# Patient Record
Sex: Female | Born: 1960
Health system: Southern US, Community
[De-identification: ages and names within clinical notes are randomized; demographics above are authoritative.]

## PROBLEM LIST (undated history)

## (undated) DIAGNOSIS — E559 Vitamin D deficiency, unspecified: Secondary | ICD-10-CM

## (undated) DIAGNOSIS — E785 Hyperlipidemia, unspecified: Secondary | ICD-10-CM

## (undated) DIAGNOSIS — Z90710 Acquired absence of both cervix and uterus: Secondary | ICD-10-CM

## (undated) DIAGNOSIS — I82409 Acute embolism and thrombosis of unspecified deep veins of unspecified lower extremity: Secondary | ICD-10-CM

## (undated) DIAGNOSIS — R51 Headache: Secondary | ICD-10-CM

## (undated) DIAGNOSIS — I10 Essential (primary) hypertension: Secondary | ICD-10-CM

## (undated) HISTORY — PX: TUBAL LIGATION: SHX77

## (undated) HISTORY — DX: Hyperlipidemia, unspecified: E78.5

## (undated) HISTORY — PX: BREAST SURGERY: SHX581

---
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
## 2011-06-13 ENCOUNTER — Emergency Department (HOSPITAL_COMMUNITY)
Admission: EM | Admit: 2011-06-13 | Discharge: 2011-06-13 | Disposition: A | Discharge status: Home / Self Care | Payer: Self-pay | Attending: Emergency Medicine | Admitting: Emergency Medicine

## 2011-06-13 ENCOUNTER — Encounter (HOSPITAL_COMMUNITY): Payer: Self-pay

## 2011-06-13 ENCOUNTER — Emergency Department (HOSPITAL_COMMUNITY): Payer: Self-pay

## 2011-06-13 DIAGNOSIS — R112 Nausea with vomiting, unspecified: Secondary | ICD-10-CM

## 2011-06-13 DIAGNOSIS — IMO0001 Reserved for inherently not codable concepts without codable children: Secondary | ICD-10-CM | POA: Insufficient documentation

## 2011-06-13 DIAGNOSIS — J40 Bronchitis, not specified as acute or chronic: Secondary | ICD-10-CM

## 2011-06-13 LAB — BASIC METABOLIC PANEL
BUN: 10 mg/dL (ref 6–23)
CO2: 25 mEq/L (ref 19–32)
Calcium: 8.4 mg/dL (ref 8.4–10.5)
Chloride: 105 mEq/L (ref 96–112)
Creatinine, Ser: 0.63 mg/dL (ref 0.50–1.10)
GFR calc Af Amer: >90 mL/min (ref >90)
GFR calc non Af Amer: >90 mL/min (ref >90)
Glucose, Bld: 87 mg/dL (ref 70–99)
Potassium: 3.5 mEq/L (ref 3.5–5.1)
Sodium: 138 mEq/L (ref 135–145)

## 2011-06-13 LAB — CBC
HCT: 33.9 % — ABNORMAL LOW (ref 36.0–46.0)
Hemoglobin: 11.2 g/dL — ABNORMAL LOW (ref 12.0–15.0)
MCH: 28.7 pg (ref 26.0–34.0)
MCHC: 33 g/dL (ref 30.0–36.0)
MCV: 86.9 fL (ref 78.0–100.0)
Platelets: 174 10*3/uL (ref 150–400)
RBC: 3.9 MIL/uL (ref 3.87–5.11)
RDW: 16 % — ABNORMAL HIGH (ref 11.5–15.5)
WBC: 4.4 10*3/uL (ref 4.0–10.5)

## 2011-06-13 MED ORDER — ONDANSETRON HCL 4 MG/2ML IJ SOLN
4.0000 mg | Freq: Once | INTRAMUSCULAR | Status: AC
  Administered 2011-06-13: 4 mg via INTRAVENOUS
  Filled 2011-06-13: qty 2

## 2011-06-13 MED ORDER — PROMETHAZINE HCL 25 MG PO TABS: 25.0000 mg | ORAL_TABLET | Freq: Four times a day (QID) | ORAL | Status: DC | PRN

## 2011-06-13 MED ORDER — SODIUM CHLORIDE 0.9 % IV BOLUS (SEPSIS)
1000.0000 mL | Freq: Once | INTRAVENOUS | Status: AC
  Administered 2011-06-13: 1000 mL via INTRAVENOUS

## 2011-06-13 MED ORDER — MORPHINE SULFATE 4 MG/ML IJ SOLN
4.0000 mg | Freq: Once | INTRAMUSCULAR | Status: AC
  Administered 2011-06-13: 4 mg via INTRAVENOUS
  Filled 2011-06-13: qty 1

## 2011-06-13 NOTE — Discharge Instructions (Signed)

## 2011-06-13 NOTE — ED Provider Notes (Signed)
History     CSN: [id number]  Arrival date & time 06/13/11  4098   First MD Initiated Contact with Patient 06/13/11 [phone]      Chief Complaint  Patient presents with  . Cough  . Generalized Body Aches    (Consider location/radiation/quality/duration/timing/severity/associated sxs/prior treatment) The history is provided by the patient.   the patient reports she's had URI symptoms for 4 or 5 days with sinus pressure.  2 days ago she developed productive cough with green sputum without shortness of breath.  She reports subjective fever and chills.  She had several episodes of nausea and nonbloody nonbilious vomiting that occurred last night.  She's had no diarrhea.  She denies abdominal pain.  She reports no chest pain at this time.  She reports generalized myalgias.  Nothing worsens her symptoms.  Nothing improves her symptoms.  Her symptoms are constant and mild in severity  History reviewed. No pertinent past medical history.  Past Surgical History  Procedure Date  . Breast surgery     Family History  Problem Relation Age of Onset  . Heart failure Mother   . Hypertension Mother     History  Substance Use Topics  . Smoking status: Current Everyday Smoker -- 0.5 packs/day  . Smokeless tobacco: Never Used  . Alcohol Use: No    OB History    Grav Para Term Preterm Abortions TAB SAB Ect Mult Living                  Review of Systems  Respiratory: Positive for cough.   All other systems reviewed and are negative.    Allergies  Review of patient's allergies indicates no known allergies.  Home Medications  No current outpatient prescriptions on file.  BP 112/65  Pulse 84  Temp(Src) 97.6 F (36.4 C) (Oral)  Resp 18  SpO2 100%  LMP 06/13/2011  Physical Exam  Nursing note and vitals reviewed. Constitutional: She is oriented to person, place, and time. She appears well-developed and well-nourished. No distress.  HENT:  Head: Normocephalic and atraumatic.  Eyes:  EOM are normal.  Neck: Normal range of motion.  Cardiovascular: Normal rate, regular rhythm and normal heart sounds.   Pulmonary/Chest: Effort normal and breath sounds normal.  Abdominal: Soft. She exhibits no distension. There is no tenderness.  Musculoskeletal: Normal range of motion.  Neurological: She is alert and oriented to person, place, and time.  Skin: Skin is warm and dry.  Psychiatric: She has a normal mood and affect. Judgment normal.    ED Course  Procedures (including critical care time)  Labs Reviewed  CBC - Abnormal; Notable for the following:    Hemoglobin 11.2 (*)    HCT 33.9 (*)    RDW 16.0 (*)    All other components within normal limits  BASIC METABOLIC PANEL   Dg Chest 2 View  06/13/2011  *RADIOLOGY REPORT*  Clinical Data: Cough, generalized body aches  CHEST - 2 VIEW  Comparison: None.  Findings: Cardiomediastinal silhouette is unremarkable.  No acute infiltrate or pulmonary edema.  Bony thorax is unremarkable.  Mild perihilar increased bronchial markings suspicious for mild bronchitic changes.  IMPRESSION: No acute infiltrate or pulmonary edema.  Mild perihilar increased bronchial markings suspicious for mild bronchitic changes.  Original Report Authenticated By: Natasha Mead, M.D.     1. Bronchitis   2. Nausea & vomiting       MDM  Labs and x-ray pending.  Patient will receive IV fluids and Zofran.  Her abdomen is benign on exam.  This is not ACS or pulmonary embolism.  I suspect this is viral bronchitis.  Her episodes of vomiting some posttussive in nature.  I recommended that the patient stop smoking cigarettes  12:23 PM The patient feels much better at this time.  Her repeat abdominal exam is benign.  She agrees to outpatient plan.  DC home with antinausea medicine.  This appears to be a bronchitis with associated nausea and vomiting without diarrhea        Lyanne Co, MD 06/13/11 1223

## 2011-06-13 NOTE — ED Notes (Signed)
Patient reports that she began having ahead and chest congestion 5 days ago and is now having body aches, a productive cough with green sputum and a fever on Thursday.

## 2012-03-09 DIAGNOSIS — Z90710 Acquired absence of both cervix and uterus: Secondary | ICD-10-CM

## 2012-03-09 HISTORY — DX: Acquired absence of both cervix and uterus: Z90.710

## 2012-03-24 NOTE — H&P (Signed)
[name]  DICTATION #[id number] CSN# [id number]   [name], MD 03/24/2012 1:36 PM

## 2012-03-25 NOTE — H&P (Signed)
[name], [name]               ACCOUNT NO.:  [id number]  MEDICAL RECORD NO.:  [id number]  LOCATION:  [facility]                         FACILITY:  [facility]  PHYSICIAN:  Duke Salvia. Marcelle Overlie, M.D.DATE OF BIRTH:  Jun 09, 1960  DATE OF ADMISSION:  04/06/2012 DATE OF DISCHARGE:                             HISTORY & PHYSICAL   CHIEF COMPLAINT:  Pelvic pain, menorrhagia, leiomyoma.  HISTORY OF PRESENT ILLNESS:  A 52 year old G7, P5, prior tubal.  I saw initially in November 2013, with complaints of pelvic pain and dysmenorrhea.  Initially, she said she was told previously that she did have fibroids.  My initial exam did show an enlarged uterus.  Ultrasound FHT was done in our office, February 25, 2012.  This showed multiple fibroids, 5.1, 4.0, 4.2, 3.6, 4.7, 5.4, and a number of other smaller ones.  She was given Percocet for pain, but has had enough difficulty with her symptoms that she presents now for definitive TAH-BSO.  She did state that since she is perimenopausal, would prefer to have her ovaries removed at that time.  The procedure of TAH-BSO discussed at length with her including specific risks related to bleeding infection, transfusion, adjacent organ injury, wound infection, phlebitis, the possible need for ERT along with her expected recovery time.  PAST MEDICAL HISTORY:  Allergies:  None. Current Medications:  Percocet p.r.n.  OBSTETRICAL HISTORY:  She has had 2 pregnancy terminations, 5 vaginal deliveries, tubal ligation, and an outpatient breast biopsy for benign disease.  REVIEW OF SYSTEMS:  Significant for headache, otherwise negative.  FAMILY HISTORY:  Significant for heart disease, asthma, diabetes.  SOCIAL HISTORY:  Denies drug or alcohol use.  She does smoke 1/2 PPD. She is single.  PHYSICAL EXAMINATION:  VITAL SIGNS:  Temp 98.2, blood pressure is 110/70. HEENT:  Unremarkable. NECK:  Supple without masses. LUNGS:  Clear. CARDIOVASCULAR:  Regular rate and rhythm  without murmurs, rubs, or gallops. BREASTS:  Without masses. ABDOMEN:  Soft, flat, nontender. PELVIC:  Normal external genitalia.  Vagina and cervix clear.  Uterus is 12-14 week size.  Adnexa unremarkable.  There was no significant descent and fundus of the uterus was __________ at the top. EXTREMITIES:  Unremarkable. NEUROLOGIC:  Unremarkable.  IMPRESSION:  Symptomatic leiomyoma.  PLAN:  TAH-BSO.  Procedure and risks discussed as above.     Bergen Magner M. Marcelle Overlie, M.D.     RMH/MEDQ  D:  03/24/2012  T:  03/24/2012  Job:  [id number]

## 2012-04-01 ENCOUNTER — Encounter (HOSPITAL_COMMUNITY)
Admission: RE | Admit: 2012-04-01 | Discharge: 2012-04-01 | Disposition: A | Discharge status: Home / Self Care | Payer: PRIVATE HEALTH INSURANCE | Source: Ambulatory Visit | Attending: Obstetrics and Gynecology | Admitting: Obstetrics and Gynecology

## 2012-04-01 ENCOUNTER — Encounter (HOSPITAL_COMMUNITY): Payer: Self-pay

## 2012-04-01 ENCOUNTER — Encounter (HOSPITAL_COMMUNITY): Payer: Self-pay | Admitting: Pharmacy Technician

## 2012-04-01 HISTORY — DX: Headache: R51

## 2012-04-01 LAB — CBC
HCT: 36.9 % (ref 36.0–46.0)
Hemoglobin: 11.9 g/dL — ABNORMAL LOW (ref 12.0–15.0)
MCH: 29.5 pg (ref 26.0–34.0)
MCHC: 32.2 g/dL (ref 30.0–36.0)
MCV: 91.3 fL (ref 78.0–100.0)
Platelets: 258 K/uL (ref 150–400)
RBC: 4.04 MIL/uL (ref 3.87–5.11)
RDW: 15.2 % (ref 11.5–15.5)
WBC: 5.4 K/uL (ref 4.0–10.5)

## 2012-04-01 LAB — SURGICAL PCR SCREEN
MRSA, PCR: NEGATIVE
Staphylococcus aureus: NEGATIVE

## 2012-04-01 NOTE — Patient Instructions (Addendum)
   Your procedure is scheduled on: Wednesday, Jan 29  Enter through the [employer] [facility] at: 6 am Pick up the phone at the desk and dial [phone] and inform us of your arrival.  Please call this number if you have any problems the morning of surgery: [phone]  Remember: Do not eat food after midnight: Tuesday Do not drink clear liquids after: midnight Tuesday Take these medicines the morning of surgery with a SIP OF WATER:  None  Do not wear jewelry, make-up, or FINGER nail polish No metal in your hair or on your body. Do not wear lotions, powders, perfumes. You may wear deodorant.  Please use your CHG wash as directed prior to surgery.  Do not shave anywhere for at least 12 hours prior to first CHG shower.  Do not bring valuables to the hospital. Contacts, dentures or bridgework may not be worn into surgery.  Leave suitcase in the car. After Surgery it may be brought to your room. For patients being admitted to the hospital, checkout time is 11:00am the day of discharge.   Home with daughter Lieutenant Diego.

## 2012-04-06 ENCOUNTER — Inpatient Hospital Stay (HOSPITAL_COMMUNITY)
Admission: RE | Admit: 2012-04-06 | Discharge: 2012-04-08 | DRG: 743 | Disposition: A | Discharge status: Home / Self Care | Payer: PRIVATE HEALTH INSURANCE | Source: Ambulatory Visit | Attending: Obstetrics and Gynecology | Admitting: Obstetrics and Gynecology

## 2012-04-06 ENCOUNTER — Inpatient Hospital Stay (HOSPITAL_COMMUNITY): Payer: PRIVATE HEALTH INSURANCE | Admitting: Anesthesiology

## 2012-04-06 ENCOUNTER — Encounter (HOSPITAL_COMMUNITY)
Admission: RE | Disposition: A | Discharge status: Home / Self Care | Payer: Self-pay | Source: Ambulatory Visit | Attending: Obstetrics and Gynecology

## 2012-04-06 ENCOUNTER — Encounter (HOSPITAL_COMMUNITY): Payer: Self-pay | Admitting: Anesthesiology

## 2012-04-06 ENCOUNTER — Encounter (HOSPITAL_COMMUNITY): Payer: Self-pay | Admitting: *Deleted

## 2012-04-06 DIAGNOSIS — N949 Unspecified condition associated with female genital organs and menstrual cycle: Secondary | ICD-10-CM | POA: Diagnosis present

## 2012-04-06 DIAGNOSIS — D251 Intramural leiomyoma of uterus: Secondary | ICD-10-CM | POA: Diagnosis present

## 2012-04-06 DIAGNOSIS — D252 Subserosal leiomyoma of uterus: Secondary | ICD-10-CM | POA: Diagnosis present

## 2012-04-06 DIAGNOSIS — N9489 Other specified conditions associated with female genital organs and menstrual cycle: Secondary | ICD-10-CM | POA: Diagnosis present

## 2012-04-06 DIAGNOSIS — N8000 Endometriosis of the uterus, unspecified: Secondary | ICD-10-CM | POA: Diagnosis present

## 2012-04-06 DIAGNOSIS — N80209 Endometriosis of unspecified fallopian tube, unspecified depth: Secondary | ICD-10-CM | POA: Diagnosis present

## 2012-04-06 DIAGNOSIS — N946 Dysmenorrhea, unspecified: Secondary | ICD-10-CM | POA: Diagnosis present

## 2012-04-06 DIAGNOSIS — N802 Endometriosis of fallopian tube: Secondary | ICD-10-CM | POA: Diagnosis present

## 2012-04-06 DIAGNOSIS — D219 Benign neoplasm of connective and other soft tissue, unspecified: Secondary | ICD-10-CM

## 2012-04-06 DIAGNOSIS — N938 Other specified abnormal uterine and vaginal bleeding: Principal | ICD-10-CM | POA: Diagnosis present

## 2012-04-06 HISTORY — PX: SALPINGOOPHORECTOMY: SHX82

## 2012-04-06 HISTORY — PX: ABDOMINAL HYSTERECTOMY: SHX81

## 2012-04-06 LAB — PREGNANCY, URINE: Preg Test, Ur: NEGATIVE

## 2012-04-06 SURGERY — HYSTERECTOMY, ABDOMINAL
Anesthesia: General | Site: Abdomen | Wound class: Clean Contaminated

## 2012-04-06 MED ORDER — KETOROLAC TROMETHAMINE 30 MG/ML IJ SOLN
INTRAMUSCULAR | Status: DC | PRN
  Administered 2012-04-06: 30 mg via INTRAVENOUS

## 2012-04-06 MED ORDER — DEXAMETHASONE SODIUM PHOSPHATE 4 MG/ML IJ SOLN
INTRAMUSCULAR | Status: DC | PRN
  Administered 2012-04-06: 10 mg via INTRAVENOUS

## 2012-04-06 MED ORDER — BUPIVACAINE LIPOSOME 1.3 % IJ SUSP
20.0000 mL | Freq: Once | INTRAMUSCULAR | Status: AC
  Administered 2012-04-06: 20 mL
  Filled 2012-04-06: qty 20

## 2012-04-06 MED ORDER — LIDOCAINE HCL (CARDIAC) 20 MG/ML IV SOLN
INTRAVENOUS | Status: AC
  Filled 2012-04-06: qty 5

## 2012-04-06 MED ORDER — ROCURONIUM BROMIDE 100 MG/10ML IV SOLN
INTRAVENOUS | Status: DC | PRN
  Administered 2012-04-06: 50 mg via INTRAVENOUS

## 2012-04-06 MED ORDER — IBUPROFEN 800 MG PO TABS
800.0000 mg | ORAL_TABLET | Freq: Three times a day (TID) | ORAL | Status: DC | PRN
  Administered 2012-04-07 – 2012-04-08 (×3): 800 mg via ORAL
  Filled 2012-04-06 (×3): qty 1

## 2012-04-06 MED ORDER — KETOROLAC TROMETHAMINE 30 MG/ML IJ SOLN: 30.0000 mg | Freq: Once | INTRAMUSCULAR | Status: DC

## 2012-04-06 MED ORDER — HYDROMORPHONE HCL PF 1 MG/ML IJ SOLN
INTRAMUSCULAR | Status: AC
  Administered 2012-04-06: 0.5 mg via INTRAVENOUS
  Filled 2012-04-06: qty 1

## 2012-04-06 MED ORDER — MORPHINE SULFATE (PF) 1 MG/ML IV SOLN
INTRAVENOUS | Status: DC
  Administered 2012-04-06: 5 mg via INTRAVENOUS
  Administered 2012-04-06: 11:00:00 via INTRAVENOUS
  Administered 2012-04-06: 15 mg via INTRAVENOUS
  Administered 2012-04-06: 22:00:00 via INTRAVENOUS
  Administered 2012-04-06: 12 mL via INTRAVENOUS
  Administered 2012-04-07: 15 mg via INTRAVENOUS
  Administered 2012-04-07: 13.53 mg via INTRAVENOUS
  Filled 2012-04-06 (×2): qty 25

## 2012-04-06 MED ORDER — ZOLPIDEM TARTRATE 5 MG PO TABS
5.0000 mg | ORAL_TABLET | Freq: Every evening | ORAL | Status: DC | PRN
Start: 2012-04-06 — End: 2012-04-08

## 2012-04-06 MED ORDER — ONDANSETRON HCL 4 MG/2ML IJ SOLN
INTRAMUSCULAR | Status: DC | PRN
  Administered 2012-04-06 (×2): 4 mg via INTRAVENOUS

## 2012-04-06 MED ORDER — LACTATED RINGERS IV SOLN
INTRAVENOUS | Status: DC
  Administered 2012-04-06 (×2): via INTRAVENOUS

## 2012-04-06 MED ORDER — LACTATED RINGERS IV SOLN
INTRAVENOUS | Status: DC
  Administered 2012-04-06 – 2012-04-07 (×4): via INTRAVENOUS

## 2012-04-06 MED ORDER — SODIUM CHLORIDE 0.9 % IJ SOLN: 20.0000 mL | Freq: Once | INTRAMUSCULAR | Status: DC

## 2012-04-06 MED ORDER — OXYCODONE-ACETAMINOPHEN 5-325 MG PO TABS
1.0000 | ORAL_TABLET | ORAL | Status: DC | PRN
Start: 2012-04-06 — End: 2012-04-08
  Administered 2012-04-07 (×2): 1 via ORAL
  Administered 2012-04-08 (×2): 2 via ORAL
  Filled 2012-04-06: qty 1
  Filled 2012-04-06 (×2): qty 2
  Filled 2012-04-06: qty 1

## 2012-04-06 MED ORDER — LIDOCAINE HCL (CARDIAC) 20 MG/ML IV SOLN
INTRAVENOUS | Status: DC | PRN
  Administered 2012-04-06: 80 mg via INTRAVENOUS

## 2012-04-06 MED ORDER — NALOXONE HCL 0.4 MG/ML IJ SOLN: 0.4000 mg | INTRAMUSCULAR | Status: DC | PRN

## 2012-04-06 MED ORDER — ONDANSETRON HCL 4 MG/2ML IJ SOLN
INTRAMUSCULAR | Status: AC
  Filled 2012-04-06: qty 2

## 2012-04-06 MED ORDER — DEXAMETHASONE SODIUM PHOSPHATE 10 MG/ML IJ SOLN
INTRAMUSCULAR | Status: AC
  Filled 2012-04-06: qty 1

## 2012-04-06 MED ORDER — CEFAZOLIN SODIUM-DEXTROSE 2-3 GM-% IV SOLR
2.0000 g | INTRAVENOUS | Status: AC
  Administered 2012-04-06: 2 g via INTRAVENOUS

## 2012-04-06 MED ORDER — FENTANYL CITRATE 0.05 MG/ML IJ SOLN
INTRAMUSCULAR | Status: AC
  Filled 2012-04-06: qty 2

## 2012-04-06 MED ORDER — MENTHOL 3 MG MT LOZG: 1.0000 | LOZENGE | OROMUCOSAL | Status: DC | PRN

## 2012-04-06 MED ORDER — DIPHENHYDRAMINE HCL 12.5 MG/5ML PO ELIX: 12.5000 mg | ORAL_SOLUTION | Freq: Four times a day (QID) | ORAL | Status: DC | PRN

## 2012-04-06 MED ORDER — NEOSTIGMINE METHYLSULFATE 1 MG/ML IJ SOLN
INTRAMUSCULAR | Status: DC | PRN
  Administered 2012-04-06: 3 mg via INTRAVENOUS

## 2012-04-06 MED ORDER — HYDROMORPHONE HCL PF 1 MG/ML IJ SOLN
0.2500 mg | INTRAMUSCULAR | Status: DC | PRN
  Administered 2012-04-06 (×3): 0.5 mg via INTRAVENOUS

## 2012-04-06 MED ORDER — MIDAZOLAM HCL 2 MG/2ML IJ SOLN
INTRAMUSCULAR | Status: AC
  Filled 2012-04-06: qty 2

## 2012-04-06 MED ORDER — PROPOFOL 10 MG/ML IV EMUL
INTRAVENOUS | Status: DC | PRN
  Administered 2012-04-06: 120 mg via INTRAVENOUS

## 2012-04-06 MED ORDER — ROCURONIUM BROMIDE 50 MG/5ML IV SOLN
INTRAVENOUS | Status: AC
  Filled 2012-04-06: qty 1

## 2012-04-06 MED ORDER — FENTANYL CITRATE 0.05 MG/ML IJ SOLN
INTRAMUSCULAR | Status: DC | PRN
  Administered 2012-04-06 (×2): 50 ug via INTRAVENOUS
  Administered 2012-04-06: 150 ug via INTRAVENOUS
  Administered 2012-04-06 (×2): 50 ug via INTRAVENOUS

## 2012-04-06 MED ORDER — PROPOFOL 10 MG/ML IV EMUL
INTRAVENOUS | Status: AC
  Filled 2012-04-06: qty 20

## 2012-04-06 MED ORDER — KETOROLAC TROMETHAMINE 30 MG/ML IJ SOLN
30.0000 mg | Freq: Four times a day (QID) | INTRAMUSCULAR | Status: DC
  Administered 2012-04-06 – 2012-04-07 (×3): 30 mg via INTRAVENOUS
  Filled 2012-04-06 (×3): qty 1

## 2012-04-06 MED ORDER — SODIUM CHLORIDE 0.9 % IJ SOLN: 9.0000 mL | INTRAMUSCULAR | Status: DC | PRN

## 2012-04-06 MED ORDER — KETOROLAC TROMETHAMINE 30 MG/ML IJ SOLN: 30.0000 mg | Freq: Four times a day (QID) | INTRAMUSCULAR | Status: DC

## 2012-04-06 MED ORDER — HYDROMORPHONE HCL PF 1 MG/ML IJ SOLN
INTRAMUSCULAR | Status: AC
  Filled 2012-04-06: qty 1

## 2012-04-06 MED ORDER — DIPHENHYDRAMINE HCL 50 MG/ML IJ SOLN: 12.5000 mg | Freq: Four times a day (QID) | INTRAMUSCULAR | Status: DC | PRN

## 2012-04-06 MED ORDER — FENTANYL CITRATE 0.05 MG/ML IJ SOLN
INTRAMUSCULAR | Status: AC
  Filled 2012-04-06: qty 5

## 2012-04-06 MED ORDER — MIDAZOLAM HCL 5 MG/5ML IJ SOLN
INTRAMUSCULAR | Status: DC | PRN
  Administered 2012-04-06: 2 mg via INTRAVENOUS

## 2012-04-06 MED ORDER — NEOSTIGMINE METHYLSULFATE 1 MG/ML IJ SOLN
INTRAMUSCULAR | Status: AC
  Filled 2012-04-06: qty 1

## 2012-04-06 MED ORDER — 0.9 % SODIUM CHLORIDE (POUR BTL) OPTIME
TOPICAL | Status: DC | PRN
  Administered 2012-04-06: 1000 mL

## 2012-04-06 MED ORDER — BUPIVACAINE HCL (PF) 0.5 % IJ SOLN
INTRAMUSCULAR | Status: AC
  Filled 2012-04-06: qty 270

## 2012-04-06 MED ORDER — ACETAMINOPHEN 10 MG/ML IV SOLN
INTRAVENOUS | Status: AC
  Filled 2012-04-06: qty 100

## 2012-04-06 MED ORDER — ONDANSETRON HCL 4 MG PO TABS
4.0000 mg | ORAL_TABLET | Freq: Four times a day (QID) | ORAL | Status: DC | PRN
  Filled 2012-04-06: qty 1

## 2012-04-06 MED ORDER — KETOROLAC TROMETHAMINE 30 MG/ML IJ SOLN: 15.0000 mg | Freq: Once | INTRAMUSCULAR | Status: DC | PRN

## 2012-04-06 MED ORDER — KETOROLAC TROMETHAMINE 30 MG/ML IJ SOLN
INTRAMUSCULAR | Status: AC
  Filled 2012-04-06: qty 1

## 2012-04-06 MED ORDER — METHYLENE BLUE 1 % INJ SOLN
INTRAMUSCULAR | Status: AC
  Filled 2012-04-06: qty 1

## 2012-04-06 MED ORDER — BUTORPHANOL TARTRATE 1 MG/ML IJ SOLN: 1.0000 mg | INTRAMUSCULAR | Status: DC | PRN

## 2012-04-06 MED ORDER — CEFAZOLIN SODIUM-DEXTROSE 2-3 GM-% IV SOLR
INTRAVENOUS | Status: AC
  Filled 2012-04-06: qty 50

## 2012-04-06 MED ORDER — GLYCOPYRROLATE 0.2 MG/ML IJ SOLN
INTRAMUSCULAR | Status: DC | PRN
  Administered 2012-04-06: 0.4 mg via INTRAVENOUS

## 2012-04-06 MED ORDER — GLYCOPYRROLATE 0.2 MG/ML IJ SOLN
INTRAMUSCULAR | Status: AC
  Filled 2012-04-06: qty 2

## 2012-04-06 MED ORDER — ACETAMINOPHEN 10 MG/ML IV SOLN
1000.0000 mg | Freq: Once | INTRAVENOUS | Status: AC
  Administered 2012-04-06: 1000 mg via INTRAVENOUS
  Filled 2012-04-06: qty 100

## 2012-04-06 SURGICAL SUPPLY — 37 items
BARRIER ADHS 3X4 INTERCEED (GAUZE/BANDAGES/DRESSINGS) IMPLANT
CANISTER SUCTION 2500CC (MISCELLANEOUS) ×3 IMPLANT
CATH KIT ON Q 5IN DUAL SLV (PAIN MANAGEMENT) IMPLANT
CLOTH BEACON ORANGE TIMEOUT ST (SAFETY) ×3 IMPLANT
CONT PATH 16OZ SNAP LID 3702 (MISCELLANEOUS) ×3 IMPLANT
DECANTER SPIKE VIAL GLASS SM (MISCELLANEOUS) IMPLANT
DRSG COVADERM 4X10 (GAUZE/BANDAGES/DRESSINGS) ×3 IMPLANT
DRSG TEGADERM 2.38X2.75 (GAUZE/BANDAGES/DRESSINGS) IMPLANT
ELECT LIGASURE SHORT 9 REUSE (ELECTRODE) ×3 IMPLANT
GLOVE BIO SURGEON STRL SZ7 (GLOVE) ×6 IMPLANT
GOWN PREVENTION PLUS LG XLONG (DISPOSABLE) ×12 IMPLANT
NEEDLE HYPO 25X1 1.5 SAFETY (NEEDLE) ×3 IMPLANT
NS IRRIG 1000ML POUR BTL (IV SOLUTION) ×6 IMPLANT
PACK ABDOMINAL GYN (CUSTOM PROCEDURE TRAY) ×3 IMPLANT
PAD OB MATERNITY 4.3X12.25 (PERSONAL CARE ITEMS) ×3 IMPLANT
PROTECTOR NERVE ULNAR (MISCELLANEOUS) ×3 IMPLANT
SPONGE LAP 18X18 X RAY DECT (DISPOSABLE) ×3 IMPLANT
STRIP CLOSURE SKIN 1/4X4 (GAUZE/BANDAGES/DRESSINGS) IMPLANT
SUT CHROMIC 3 0 SH 27 (SUTURE) IMPLANT
SUT MON AB 2-0 CT1 36 (SUTURE) ×3 IMPLANT
SUT MON AB 4-0 PS1 27 (SUTURE) ×3 IMPLANT
SUT PDS AB 0 CT1 27 (SUTURE) ×6 IMPLANT
SUT PDS AB 1 CT  36 (SUTURE)
SUT PDS AB 1 CT 36 (SUTURE) IMPLANT
SUT VIC AB 0 CT1 18XCR BRD8 (SUTURE) ×6 IMPLANT
SUT VIC AB 0 CT1 27 (SUTURE) ×3
SUT VIC AB 0 CT1 27XBRD ANBCTR (SUTURE) ×6 IMPLANT
SUT VIC AB 0 CT1 8-18 (SUTURE) ×3
SUT VIC AB 2-0 CT1 27 (SUTURE)
SUT VIC AB 2-0 CT1 TAPERPNT 27 (SUTURE) IMPLANT
SUT VIC AB 3-0 CT1 27 (SUTURE) ×2
SUT VIC AB 3-0 CT1 TAPERPNT 27 (SUTURE) ×4 IMPLANT
SUT VICRYL 0 TIES 12 18 (SUTURE) ×3 IMPLANT
SYR CONTROL 10ML LL (SYRINGE) ×3 IMPLANT
TOWEL OR 17X24 6PK STRL BLUE (TOWEL DISPOSABLE) ×6 IMPLANT
TRAY FOLEY CATH 14FR (SET/KITS/TRAYS/PACK) ×3 IMPLANT
WATER STERILE IRR 1000ML POUR (IV SOLUTION) IMPLANT

## 2012-04-06 NOTE — Anesthesia Procedure Notes (Signed)
Procedure Name: Intubation Date/Time: 04/06/2012 7:27 AM Performed by: Quentin Ore Pre-anesthesia Checklist: Patient identified, Emergency Drugs available, Suction available, Patient being monitored and Timeout performed Patient Re-evaluated:Patient Re-evaluated prior to inductionOxygen Delivery Method: Circle system utilized Preoxygenation: Pre-oxygenation with 100% oxygen Intubation Type: IV induction Ventilation: Mask ventilation without difficulty Laryngoscope Size: Mac and 3 Grade View: Grade I Tube type: Oral Tube size: 7.0 mm Number of attempts: 1 Airway Equipment and Method: Stylet Placement Confirmation: ETT inserted through vocal cords under direct vision,  positive ETCO2 and breath sounds checked- equal and bilateral Secured at: 21 cm Tube secured with: Tape Dental Injury: Teeth and Oropharynx as per pre-operative assessment

## 2012-04-06 NOTE — Transfer of Care (Signed)
Immediate Anesthesia Transfer of Care Note  Patient: [name]  Procedure(s) Performed: Procedure(s) (LRB) with comments: HYSTERECTOMY ABDOMINAL (N/A) SALPINGO OOPHORECTOMY (Bilateral)  Patient Location: PACU  Anesthesia Type:General  Level of Consciousness: awake, alert  and oriented  Airway & Oxygen Therapy: Patient Spontanous Breathing and Patient connected to nasal cannula oxygen  Post-op Assessment: Report given to PACU RN, Post -op Vital signs reviewed and stable and Patient moving all extremities X 4  Post vital signs: Reviewed and stable  Complications: No apparent anesthesia complications

## 2012-04-06 NOTE — Anesthesia Postprocedure Evaluation (Signed)
  Anesthesia Post-op Note  Patient: [name]  Procedure(s) Performed: Procedure(s) (LRB) with comments: HYSTERECTOMY ABDOMINAL (N/A) SALPINGO OOPHORECTOMY (Bilateral)  Patient Location: PACU  Anesthesia Type:General  Level of Consciousness: awake, alert  and oriented  Airway and Oxygen Therapy: Patient Spontanous Breathing  Post-op Pain: none  Post-op Assessment: Post-op Vital signs reviewed, Patient's Cardiovascular Status Stable, Respiratory Function Stable, Patent Airway, No signs of Nausea or vomiting and Pain level controlled  Post-op Vital Signs: Reviewed and stable  Complications: No apparent anesthesia complications

## 2012-04-06 NOTE — Progress Notes (Signed)
Ur chart review completed.  

## 2012-04-06 NOTE — Preoperative (Signed)
Beta Blockers   Reason not to administer Beta Blockers:Not Applicable 

## 2012-04-06 NOTE — Progress Notes (Signed)
The patient was re-examined with no change in status 

## 2012-04-06 NOTE — Anesthesia Preprocedure Evaluation (Signed)
Anesthesia Evaluation  Patient identified by MRN, date of birth, ID band Patient awake    Reviewed: Allergy & Precautions, H&P , NPO status , Patient's Chart, lab work & pertinent test results, reviewed documented beta blocker date and time   History of Anesthesia Complications Negative for: history of anesthetic complications  Airway Mallampati: III TM Distance: >3 FB Neck ROM: full    Dental  (+) Poor Dentition and Loose,    Pulmonary Current Smoker,  breath sounds clear to auscultation  Pulmonary exam normal       Cardiovascular Exercise Tolerance: Good negative cardio ROS  Rhythm:regular Rate:Normal     Neuro/Psych negative neurological ROS  negative psych ROS   GI/Hepatic negative GI ROS, Neg liver ROS,   Endo/Other  negative endocrine ROS  Renal/GU negative Renal ROS  Female GU complaint     Musculoskeletal   Abdominal   Peds  Hematology negative hematology ROS (+)   Anesthesia Other Findings   Reproductive/Obstetrics negative OB ROS                           Anesthesia Physical Anesthesia Plan  ASA: II  Anesthesia Plan: General ETT   Post-op Pain Management:    Induction:   Airway Management Planned:   Additional Equipment:   Intra-op Plan:   Post-operative Plan:   Informed Consent: I have reviewed the patients History and Physical, chart, labs and discussed the procedure including the risks, benefits and alternatives for the proposed anesthesia with the patient or authorized representative who has indicated his/her understanding and acceptance.   Dental Advisory Given  Plan Discussed with: CRNA and Surgeon  Anesthesia Plan Comments:         Anesthesia Quick Evaluation

## 2012-04-06 NOTE — Op Note (Signed)
Preoperative diagnosis: Pelvic pain, leiomyoma  Postoperative diagnosis: Same  Procedure: TAH BSO  Surgeon: Marcelle Overlie  Assistant: Morris  EBL: 300 cc  Anesthesia: Gen.  Drains: Foley catheter  Procedure and findings:  Patient taken the operating room after an adequate level of general anesthesia was obtained the patient in supine position the abdomen prepped and draped in usual fashion for abdominal procedures, the vagina was prepped separately with Betadine and Foley catheter positioned draining clear urine. Appropriate timeout for taken at that point.  Transverse Pfannenstiel incision was then made 2 finger restaurant symphysis carried down to the fascia which was incised and extended transversely. Rectus muscle divided in the midline peritoneum entered superiorly without incident and extended in a vertical fashion. O'Connor-O'Sullivan retractor was positioned bowel Speck superiorly out of the field patient in Trendelenburg. The uterus itself was 14 weeks size irregular with fibroids bilateral adnexa unremarkable. After these findings were noted long Kelly clamps were then placed at each utero-ovarian pedicle starting on the left the round ligament was clamped divided and suture ligated with 0 Vicryl the rectoperineal space on that side was developed and the peritoneum carried around to midline posterior leaf of the broad ligament was then perforated with the surgeon's finger, the left IP ligament was isolated course of the ureter was noted to be well below, the left IP ligament was then clamped divided first free tie followed by suture ligature of Vicryl. The exact same repeated on the opposite side after carefully identifying the ureter well below once this was completed the bladder was dissected below the cervical vaginal junction with sharp and blunt dissection. Uterine vessels were skeletonized clamped divided and suture ligated with 0 Vicryl. The fundus of the uterus to remove the bulk was  then excised, tenaculum used to grasp the cervical stump. I'll noting that the bladder was well below the remaining cardinal ligament uterosacral ligament and cervical vaginal pedicles were clamped divided and suture ligated with 0 Vicryl remainder the specimen was removed the vaginal cuff was closed the interrupted figure-of-eight 2-0 Vicryl sutures. The pelvis is irrigated with saline and be hemostatic the upper abdomen cecum and appendix were unremarkable. Prior to closure sponge denies precast reported as correct x2 peritoneum was enclose a running 2-0 Vicryl suture. The same to reapproximate the rectus muscles in the midline. Fascia closure laterally to midline on either side with 0 PDS suture. The subcutaneous tissue was hemostatic 15 cc of 1.3% extrarenal was then used to inject the area the fascia closure the subcutaneous tissue along its margins. Subcutaneous tissue again was noted be hemostatic 4-0 Monocryl subcuticular closure for the skin she tolerated this well went to recovery room in good condition.  Dictated with [facility]  Marvion Bastidas M. Milana Obey.D.

## 2012-04-07 ENCOUNTER — Encounter (HOSPITAL_COMMUNITY): Payer: Self-pay | Admitting: Obstetrics and Gynecology

## 2012-04-07 LAB — CBC
HCT: 33.8 % — ABNORMAL LOW (ref 36.0–46.0)
Hemoglobin: 10.6 g/dL — ABNORMAL LOW (ref 12.0–15.0)
MCH: 29.4 pg (ref 26.0–34.0)
MCHC: 31.4 g/dL (ref 30.0–36.0)
MCV: 93.6 fL (ref 78.0–100.0)
Platelets: 283 10*3/uL (ref 150–400)
RBC: 3.61 MIL/uL — ABNORMAL LOW (ref 3.87–5.11)
RDW: 15.2 % (ref 11.5–15.5)
WBC: 11.1 10*3/uL — ABNORMAL HIGH (ref 4.0–10.5)

## 2012-04-07 MED ORDER — SODIUM CHLORIDE 0.9 % IJ SOLN
3.0000 mL | INTRAMUSCULAR | Status: DC | PRN
  Administered 2012-04-07: 3 mL via INTRAVENOUS

## 2012-04-07 MED ORDER — SODIUM CHLORIDE 0.9 % IJ SOLN
3.0000 mL | Freq: Two times a day (BID) | INTRAMUSCULAR | Status: DC
  Administered 2012-04-07: 3 mL via INTRAVENOUS

## 2012-04-07 MED ORDER — ONDANSETRON HCL 4 MG/2ML IJ SOLN
4.0000 mg | Freq: Once | INTRAMUSCULAR | Status: AC
  Administered 2012-04-07: 4 mg via INTRAVENOUS

## 2012-04-07 NOTE — Anesthesia Postprocedure Evaluation (Signed)
  Anesthesia Post-op Note  Patient: [name]  Procedure(s) Performed: Procedure(s) (LRB) with comments: HYSTERECTOMY ABDOMINAL (N/A) SALPINGO OOPHORECTOMY (Bilateral)  Patient Location: PACU and Women's Unit  Anesthesia Type:General  Level of Consciousness: awake, alert , oriented and patient cooperative  Airway and Oxygen Therapy: Patient Spontanous Breathing  Post-op Pain: mild  Post-op Assessment: Post-op Vital signs reviewed and Patient's Cardiovascular Status Stable  Post-op Vital Signs: Reviewed and stable  Complications: No apparent anesthesia complications

## 2012-04-07 NOTE — Addendum Note (Signed)
Addendum  created 04/07/12 0820 by Orlie Pollen, CRNA   Modules edited:Notes Section

## 2012-04-07 NOTE — Progress Notes (Signed)
1 Day Post-Op Procedure(s) (LRB): HYSTERECTOMY ABDOMINAL (N/A) SALPINGO OOPHORECTOMY (Bilateral)  Subjective: Patient reports tolerating PO.    Objective: I have reviewed patient's vital signs, intake and output and labs.  abd soft + BS, Inc C/D CBC    Component Value Date/Time   WBC 11.1* 04/07/2012 0510   RBC 3.61* 04/07/2012 0510   HGB 10.6* 04/07/2012 0510   HCT 33.8* 04/07/2012 0510   PLT 283 04/07/2012 0510   MCV 93.6 04/07/2012 0510   MCH 29.4 04/07/2012 0510   MCHC 31.4 04/07/2012 0510   RDW 15.2 04/07/2012 0510     Assessment: s/p Procedure(s) (LRB) with comments: HYSTERECTOMY ABDOMINAL (N/A) SALPINGO OOPHORECTOMY (Bilateral): stable  Plan: Encourage ambulation Discontinue IV fluids  LOS: 1 day    Aj Crunkleton M 04/07/2012, 8:42 AM

## 2012-04-08 DIAGNOSIS — D219 Benign neoplasm of connective and other soft tissue, unspecified: Secondary | ICD-10-CM

## 2012-04-08 MED ORDER — IBUPROFEN 800 MG PO TABS: 800.0000 mg | ORAL_TABLET | Freq: Three times a day (TID) | ORAL | Status: DC | PRN

## 2012-04-08 MED ORDER — OXYCODONE-ACETAMINOPHEN 5-325 MG PO TABS: 1.0000 | ORAL_TABLET | ORAL | Status: DC | PRN

## 2012-04-08 NOTE — Progress Notes (Signed)
Pt discharged to home with significant other and daughter.  Condition stable.  Pt to car via wheelchair with Dolphus Jenny, NT.  No equipment for home ordered at discharge.

## 2012-04-08 NOTE — Discharge Summary (Signed)
Physician Discharge Summary  Patient ID: Emmelia Holdsworth MRN: [id number] DOB/AGE: December 10, 1960 52 y.o.  Admit date: 04/06/2012 Discharge date: 04/08/2012  Admission Diagnoses:AUB, leiomyomata  Discharge Diagnoses: Same Active Problems:  Leiomyoma   Discharged Condition: good  Hospital Course: adm for TAHBSO, diet adv on POD #1, by POD @, afeb, tol PO, Inc C/D and ready for D/C  Consults: None  Significant Diagnostic Studies:  Results for orders placed during the hospital encounter of 04/06/12 (from the past 48 hour(s))  CBC     Status: Abnormal   Collection Time   04/07/12  5:10 AM      Component Value Range Comment   WBC 11.1 (*) 4.0 - 10.5 K/uL    RBC 3.61 (*) 3.87 - 5.11 MIL/uL    Hemoglobin 10.6 (*) 12.0 - 15.0 g/dL    HCT 84.6 (*) 96.2 - 46.0 %    MCV 93.6  78.0 - 100.0 fL    MCH 29.4  26.0 - 34.0 pg    MCHC 31.4  30.0 - 36.0 g/dL    RDW 95.2  84.1 - 32.4 %    Platelets 283  150 - 400 K/uL     Treatments: surgery: TAHBSO  Discharge Exam: Blood pressure 142/85, pulse 64, temperature 98 F (36.7 C), temperature source Oral, resp. rate 18, height 5\' 5"  (1.651 m), weight 84.369 kg (186 lb), SpO2 96.00%. abd soft + BS, Inc C/D  Disposition: 01-Home or Self Care     Medication List     As of 04/08/2012  8:36 AM    TAKE these medications         ibuprofen 800 MG tablet   Commonly known as: ADVIL,MOTRIN   Take 1 tablet (800 mg total) by mouth every 8 (eight) hours as needed (mild pain).      oxyCODONE-acetaminophen 5-325 MG per tablet   Commonly known as: PERCOCET/ROXICET   Take 1-2 tablets by mouth every 4 (four) hours as needed (moderate to severe pain (when tolerating fluids)).           Follow-up Information    Follow up with [name], MD. Schedule an appointment as soon as possible for a visit in 7 days.   Contact information:   [address] [place] [address] [phone]          Signed: [name] 04/08/2012, 8:36 AM

## 2012-04-08 NOTE — Progress Notes (Signed)
2 Days Post-Op Procedure(s) (LRB): HYSTERECTOMY ABDOMINAL (N/A) SALPINGO OOPHORECTOMY (Bilateral)  Subjective: Patient reports tolerating PO.    Objective: I have reviewed patient's vital signs and medications.  abd soft , flat + BS, inc C/D  Assessment: s/p Procedure(s) (LRB) with comments: HYSTERECTOMY ABDOMINAL (N/A) SALPINGO OOPHORECTOMY (Bilateral): stable  Plan: Discharge home  LOS: 2 days    [name] M 04/08/2012, 8:33 AM

## 2012-04-12 ENCOUNTER — Other Ambulatory Visit (HOSPITAL_COMMUNITY): Payer: Self-pay | Admitting: Obstetrics and Gynecology

## 2012-04-12 ENCOUNTER — Ambulatory Visit (HOSPITAL_COMMUNITY)
Admission: RE | Admit: 2012-04-12 | Discharge: 2012-04-12 | Disposition: A | Discharge status: Home / Self Care | Payer: PRIVATE HEALTH INSURANCE | Source: Ambulatory Visit | Attending: Obstetrics and Gynecology | Admitting: Obstetrics and Gynecology

## 2012-04-12 ENCOUNTER — Observation Stay (HOSPITAL_COMMUNITY)
Admission: AD | Admit: 2012-04-12 | Discharge: 2012-04-15 | Disposition: A | Discharge status: Home / Self Care | Payer: PRIVATE HEALTH INSURANCE | Source: Ambulatory Visit | Attending: Obstetrics and Gynecology | Admitting: Obstetrics and Gynecology

## 2012-04-12 ENCOUNTER — Encounter (HOSPITAL_COMMUNITY): Payer: Self-pay | Admitting: *Deleted

## 2012-04-12 DIAGNOSIS — M79609 Pain in unspecified limb: Secondary | ICD-10-CM

## 2012-04-12 DIAGNOSIS — Y838 Other surgical procedures as the cause of abnormal reaction of the patient, or of later complication, without mention of misadventure at the time of the procedure: Secondary | ICD-10-CM | POA: Insufficient documentation

## 2012-04-12 DIAGNOSIS — I82819 Embolism and thrombosis of superficial veins of unspecified lower extremities: Secondary | ICD-10-CM | POA: Insufficient documentation

## 2012-04-12 DIAGNOSIS — I998 Other disorder of circulatory system: Principal | ICD-10-CM | POA: Insufficient documentation

## 2012-04-12 DIAGNOSIS — R52 Pain, unspecified: Secondary | ICD-10-CM

## 2012-04-12 DIAGNOSIS — I8290 Acute embolism and thrombosis of unspecified vein: Secondary | ICD-10-CM

## 2012-04-12 LAB — CBC
HCT: 35.6 % — ABNORMAL LOW (ref 36.0–46.0)
Hemoglobin: 11.7 g/dL — ABNORMAL LOW (ref 12.0–15.0)
MCH: 29.4 pg (ref 26.0–34.0)
MCHC: 32.9 g/dL (ref 30.0–36.0)
MCV: 89.4 fL (ref 78.0–100.0)
Platelets: 225 10*3/uL (ref 150–400)
RBC: 3.98 MIL/uL (ref 3.87–5.11)
RDW: 14.5 % (ref 11.5–15.5)
WBC: 6.4 10*3/uL (ref 4.0–10.5)

## 2012-04-12 LAB — PROTIME-INR
INR: 0.99 (ref 0.00–1.49)
Prothrombin Time: 13 seconds (ref 11.6–15.2)

## 2012-04-12 LAB — ANTITHROMBIN III: AntiThromb III Func: 101 % (ref 75–120)

## 2012-04-12 LAB — COMPREHENSIVE METABOLIC PANEL
ALT: 8 U/L (ref 0–35)
AST: 11 U/L (ref 0–37)
Albumin: 3.2 g/dL — ABNORMAL LOW (ref 3.5–5.2)
Alkaline Phosphatase: 61 U/L (ref 39–117)
BUN: 16 mg/dL (ref 6–23)
CO2: 24 mEq/L (ref 19–32)
Calcium: 9.5 mg/dL (ref 8.4–10.5)
Chloride: 106 mEq/L (ref 96–112)
Creatinine, Ser: 1.23 mg/dL — ABNORMAL HIGH (ref 0.50–1.10)
GFR calc Af Amer: 57 mL/min — ABNORMAL LOW (ref >90)
GFR calc non Af Amer: 50 mL/min — ABNORMAL LOW (ref >90)
Glucose, Bld: 104 mg/dL — ABNORMAL HIGH (ref 70–99)
Potassium: 3.8 mEq/L (ref 3.5–5.1)
Sodium: 140 mEq/L (ref 135–145)
Total Bilirubin: 0.3 mg/dL (ref 0.3–1.2)
Total Protein: 7.1 g/dL (ref 6.0–8.3)

## 2012-04-12 LAB — APTT: aPTT: 39 seconds — ABNORMAL HIGH (ref 24–37)

## 2012-04-12 MED ORDER — OXYCODONE-ACETAMINOPHEN 5-325 MG PO TABS
1.0000 | ORAL_TABLET | ORAL | Status: DC | PRN
  Administered 2012-04-12: 2 via ORAL
  Administered 2012-04-12: 1 via ORAL
  Administered 2012-04-13: 2 via ORAL
  Administered 2012-04-13: 1 via ORAL
  Administered 2012-04-13 – 2012-04-14 (×2): 2 via ORAL
  Filled 2012-04-12: qty 2
  Filled 2012-04-12: qty 1
  Filled 2012-04-12 (×2): qty 2
  Filled 2012-04-12: qty 1
  Filled 2012-04-12: qty 2

## 2012-04-12 MED ORDER — ONDANSETRON HCL 4 MG PO TABS: 4.0000 mg | ORAL_TABLET | Freq: Four times a day (QID) | ORAL | Status: DC | PRN

## 2012-04-12 MED ORDER — ENOXAPARIN (LOVENOX) PATIENT EDUCATION KIT
PACK | Freq: Once | Status: AC
  Administered 2012-04-12: 17:00:00
  Filled 2012-04-12: qty 1

## 2012-04-12 MED ORDER — ONDANSETRON HCL 4 MG/2ML IJ SOLN: 4.0000 mg | Freq: Four times a day (QID) | INTRAMUSCULAR | Status: DC | PRN

## 2012-04-12 MED ORDER — ENOXAPARIN SODIUM 80 MG/0.8ML ~~LOC~~ SOLN
80.0000 mg | Freq: Two times a day (BID) | SUBCUTANEOUS | Status: DC
  Administered 2012-04-12 – 2012-04-15 (×6): 80 mg via SUBCUTANEOUS
  Filled 2012-04-12 (×6): qty 0.8

## 2012-04-12 MED ORDER — MENTHOL 3 MG MT LOZG: 1.0000 | LOZENGE | OROMUCOSAL | Status: DC | PRN

## 2012-04-12 MED ORDER — ZOLPIDEM TARTRATE 5 MG PO TABS
5.0000 mg | ORAL_TABLET | Freq: Every evening | ORAL | Status: DC | PRN
  Administered 2012-04-13 – 2012-04-14 (×2): 5 mg via ORAL
  Filled 2012-04-12 (×2): qty 1

## 2012-04-12 NOTE — Progress Notes (Addendum)
ANTICOAGULATION CONSULT NOTE - Initial Consult  Pharmacy Consult for Lovenox Indication: DVT  No Known Allergies  Patient Measurements: Height: 5\' 5"  (165.1 cm) Weight: 179 lb 6 oz (81.364 kg) IBW/kg (Calculated) : 57    Vital Signs: Temp: 97.7 F (36.5 C) (02/04 1600) Temp src: Oral (02/04 1600) BP: 169/80 mmHg (02/04 1600) Pulse Rate: 65  (02/04 1600)  Labs: Labs pending  Estimated Creatinine Clearance: 86.7 ml/min (by C-G formula based on Cr of 0.63).   Medical History: Past Medical History  Diagnosis Date  . SVD (spontaneous vaginal delivery)     x 5  . Headache     otc meds prn    S/P TAH BSO on 04/06/12; pt discharged from hospital on 04/08/12   Assessment: 52 yr old female s/p TAH BSO on 04/06/12; now presenting with DVT  Goal of Therapy:  Monitor platelets by anticoagulation protocol: Yes   Plan:  Lovenox 80 mg (1 mg/kg) SQ Q 12 hr CBC, PT, PTT, CMET have been ordered  Natasha Bence 04/12/2012,5:17 PM

## 2012-04-12 NOTE — H&P (Signed)
FADIA MARLAR  DICTATION # [id number] CSN# [id number]   [name], MD 04/12/2012 4:27 PM

## 2012-04-12 NOTE — Plan of Care (Signed)
Problem: Consults Goal: Diagnosis - Venous Thromboembolism (VTE) Choose a selection Outcome: Completed/Met Date Met:  04/12/12 DVT (Deep Vein Thrombosis)

## 2012-04-12 NOTE — Progress Notes (Signed)
*  Preliminary Results* Left lower extremity venous duplex completed. Left lower extremity is positive for acute deep vein thrombosis involving the left popliteal vein, as well as the proximal segments of the posterior tibial and peroneal veins. Preliminary results discussed with Tiffany, RN of Dr. Dennie Bible office. Patient has been instructed by Dr.Holland to return home and await information regarding an appointment with a hematologist. Patient will be contacted by Dr.Holland's office with information.  04/12/2012 3:21 PM Gertie Fey, RDMS, RDCS

## 2012-04-13 ENCOUNTER — Telehealth: Payer: Self-pay | Admitting: Oncology

## 2012-04-13 LAB — PROTEIN S, TOTAL: Protein S Ag, Total: 100 % (ref 60–150)

## 2012-04-13 LAB — LUPUS ANTICOAGULANT PANEL
DRVVT: 50.9 secs — ABNORMAL HIGH (ref <42.9)
Lupus Anticoagulant: DETECTED — AB
PTT Lupus Anticoagulant: 53.2 secs — ABNORMAL HIGH (ref 28.0–43.0)
PTTLA 4:1 Mix: 52.2 secs — ABNORMAL HIGH (ref 28.0–43.0)
PTTLA Confirmation: 15.7 secs — ABNORMAL HIGH (ref <8.0)
dRVVT Incubated 1:1 Mix: 39.3 secs (ref <42.9)

## 2012-04-13 LAB — HOMOCYSTEINE: Homocysteine: 11.4 umol/L (ref 4.0–15.4)

## 2012-04-13 LAB — PROTEIN C, TOTAL: Protein C, Total: 99 % (ref 72–160)

## 2012-04-13 LAB — PROTEIN C ACTIVITY: Protein C Activity: 166 % — ABNORMAL HIGH (ref 75–133)

## 2012-04-13 LAB — PROTEIN S ACTIVITY: Protein S Activity: 111 % (ref 69–129)

## 2012-04-13 NOTE — H&P (Signed)
NAMESHARRYN, Dean               ACCOUNT NO.:  [id number]  MEDICAL RECORD NO.:  [id number]  LOCATION:                                 FACILITY:  PHYSICIAN:  Duke Salvia. Marcelle Overlie, M.D.DATE OF BIRTH:  02-26-1961  DATE OF ADMISSION: DATE OF DISCHARGE:                             HISTORY & PHYSICAL   CHIEF COMPLAINT:  Left leg pain.  HISTORY OF PRESENT ILLNESS:  A 52 year old, G7, P5, prior tubal.  The patient underwent TAH-BSO approximately 1 week ago, she had an uncomplicated postoperative stay in the hospital.  Was discharged home to routine care and Percocet p.r.n. pain.  She stated that on Friday, she began to experience some left lower extremity pain behind her knee, worsened to the point today where she was having trouble walking, mainly due to pain behind her knee.  When she called the office, we ordered a duplex Doppler study consistent with a finding of VTE, and due to her difficulties with walking, decision was made to proceed with admission for anticoagulation.  She has had no shortness of breath or other pulmonary complaints except for her left leg.  PAST MEDICAL HISTORY:  None.  ALLERGIES:  None.  CURRENT MEDICATIONS:  Percocet p.r.n. pain.  PAST SURGICAL HISTORY:  She has had 2 pregnancy terminations.  Five vaginal deliveries.  Other surgery, tubal ligation, TAH-BSO recently.  REVIEW OF SYSTEMS:  Significant for headache, otherwise negative.  No prior history of VTE.  SOCIAL HISTORY:  Denies drug or alcohol use.  She does smoke one-half PPD.  PHYSICAL EXAMINATION:  VITAL SIGNS:  Temp 98.2, blood pressure 130/78. HEENT:  Unremarkable. NECK:  Supple without masses. LUNGS:  Clear. CARDIOVASCULAR:  Regular rate and rhythm without murmurs, rubs, or gallops. BREASTS:  Without masses. ABDOMEN:  Soft, flat, nontender.  The incision is healing well. PELVIC:  Deferred. EXTREMITIES:  She has some tenderness in the popliteal area, left lower extremity.  I could not  palpate record and there was no significant edema.  IMPRESSION:  Left lower extremity venous thromboembolism, postoperative total abdominal hysterectomy bilateral salpingo-oophorectomy.  PLAN:  We will admit for Lovenox, hypercoagulable profile was ordered, protocol orders for VTE discussed with Dr. Arline Asp, hematology started on 1 mg/kg b.i.d. of Lovenox initially.  This was explained to the patient, leg elevation with minimal ambulation initially.     Javares Kaufhold M. Marcelle Overlie, M.D.     RMH/MEDQ  D:  04/12/2012  T:  04/12/2012  Job:  [id number]

## 2012-04-13 NOTE — Telephone Encounter (Signed)
S/W pt in re NP appt 2/12 @ 3 w/Dr. Cyndie Chime.  Referring Dr. Marcelle Overlie Dx- Post op TAH UTE

## 2012-04-13 NOTE — Telephone Encounter (Signed)
C/D 04/13/12 for appt. 04/20/12

## 2012-04-13 NOTE — Care Management Note (Unsigned)
    Page 1 of 1   04/13/2012     5:42:40 PM   CARE MANAGEMENT NOTE 04/13/2012  Patient:  [name], [name]   Account Number:  [id number]  Date Initiated:  04/13/2012  Documentation initiated by:  Emilio Math  Subjective/Objective Assessment:   DVT Left Lower Leg- venous thromboembolism   Anticipated DC Date:  04/14/2012   Anticipated DC Plan:  HOME/SELF CARE  Status of service:  in process  If discussed at Long Length of Stay Meetings, dates discussed:    Comments:  04/13/12 1500 L. Floyce Stakes [facility] B[facility] [phone] Nurse Care Manager saw patient today. Patient is approximately one week post op of TAH BSO and presented with leg pain.  Patient was diagnosed with left lower extremity venous thromboembolism. Patient is currently on lovenox 80 mg bid.  Patient has been given the Lovenox starter kit from pharmacy and patient has administered her 1st shot to herself this am. Patient has insurance Tax inspector) and Investment banker, operational spoke to pharmacist at the outpt pharmacy and patient would only have co-pay on her prescriptions upon discharge. Could not find out the exact cost of Xarelto at this time b/c unsure of prescpription at discharge. Patient has f/u appointment with Dr. Cyndie Chime on 04/20/12 at 3pm for outpatient follow up. Nurse Care Manager will continue to follow . Please call for any needs.

## 2012-04-13 NOTE — Progress Notes (Signed)
Patient ID: [name], female   DOB: 1961/02/12, 52 y.o.   MRN: [id number] S//c/o L leg at knee still hurting , otherwise no c/o  O//BP 165/85  Pulse 65  Temp 97.5 F (36.4 C) (Oral)  Resp 18  Ht 5\' 5"  (1.651 m)  Wt 81.364 kg (179 lb 6 oz)  BMI 29.85 kg/m2  SpO2 97% CBC    Component Value Date/Time   WBC 6.4 04/12/2012 1713   RBC 3.98 04/12/2012 1713   HGB 11.7* 04/12/2012 1713   HCT 35.6* 04/12/2012 1713   PLT 225 04/12/2012 1713   MCV 89.4 04/12/2012 1713   MCH 29.4 04/12/2012 1713   MCHC 32.9 04/12/2012 1713   RDW 14.5 04/12/2012 1713    Inc C/C  LLE stable  A//  VTE post op TAH  P//cont Lovenox, will switch to XARELTO after 1-2 days for OPT mgmt

## 2012-04-14 ENCOUNTER — Other Ambulatory Visit: Payer: Self-pay | Admitting: Oncology

## 2012-04-14 DIAGNOSIS — I824Z9 Acute embolism and thrombosis of unspecified deep veins of unspecified distal lower extremity: Secondary | ICD-10-CM

## 2012-04-14 LAB — BETA-2-GLYCOPROTEIN I ABS, IGG/M/A
Beta-2 Glyco I IgG: 0 G Units (ref <20)
Beta-2-Glycoprotein I IgA: 11 A Units (ref <20)
Beta-2-Glycoprotein I IgM: 10 M Units (ref <20)

## 2012-04-14 LAB — CARDIOLIPIN ANTIBODIES, IGG, IGM, IGA
Anticardiolipin IgA: 8 APL U/mL — ABNORMAL LOW (ref <22)
Anticardiolipin IgG: 10 GPL U/mL — ABNORMAL LOW (ref <23)
Anticardiolipin IgM: 7 MPL U/mL — ABNORMAL LOW (ref <11)

## 2012-04-14 LAB — FACTOR 5 LEIDEN

## 2012-04-14 MED ORDER — POLYETHYLENE GLYCOL 3350 17 G PO PACK
17.0000 g | PACK | Freq: Every day | ORAL | Status: DC | PRN
  Administered 2012-04-14: 17 g via ORAL
  Filled 2012-04-14: qty 1

## 2012-04-14 MED ORDER — HYDROCORTISONE 1 % EX CREA
TOPICAL_CREAM | Freq: Three times a day (TID) | CUTANEOUS | Status: DC
  Administered 2012-04-14 (×2): via TOPICAL
  Administered 2012-04-14: 1 via TOPICAL
  Filled 2012-04-14: qty 28

## 2012-04-14 NOTE — Progress Notes (Signed)
Patient ID: [name], female   DOB: 08/24/1960, 52 y.o.   MRN: [id number] S// LLE feeling better  O//BP 152/91  Pulse 54  Temp 98 F (36.7 C) (Oral)  Resp 18  Ht 5\' 5"  (1.651 m)  Wt 81.364 kg (179 lb 6 oz)  BMI 29.85 kg/m2  SpO2 100%  Inc C/D, Abd soft + BS  LLE decr tenderness  A+P//  VTE LLE, improving, D/C  In am on BID Lovenox, has OPT appt with Heme 2/12

## 2012-04-14 NOTE — Telephone Encounter (Signed)
S/W pt in re NP appt 02/12 @ 3 w/Dr. Cyndie Chime.  Referring Dr. Marcelle Overlie Dx- Post op TAH UTE, currently on SQ Lovenox Welcome packet mailed.

## 2012-04-15 DIAGNOSIS — I8290 Acute embolism and thrombosis of unspecified vein: Secondary | ICD-10-CM

## 2012-04-15 MED ORDER — ENOXAPARIN SODIUM 80 MG/0.8ML ~~LOC~~ SOLN: 80.0000 mg | Freq: Two times a day (BID) | SUBCUTANEOUS | Status: DC

## 2012-04-15 MED ORDER — HYDROCORTISONE 1 % EX CREA: TOPICAL_CREAM | Freq: Three times a day (TID) | CUTANEOUS | Status: DC

## 2012-04-15 NOTE — Discharge Summary (Signed)
Physician Discharge Summary  Patient ID: [name] MRN: [id number] DOB/AGE: 52/12/1960 52 y.o.  Admit date: 04/12/2012 Discharge date: 04/15/2012  Admission Diagnoses:  Discharge Diagnoses:  Active Problems:  * No active hospital problems. *    Discharged Condition: good  Hospital Course: adm with US doppler verified L VTE>>Rx with SQ Lovenox with improvement in sx, D/C on Lovenox  Consults: None  Significant Diagnostic Studies: duplex doppler LE  Treatments: anticoagulation: LMW heparin  Discharge Exam: Blood pressure 137/74, pulse 68, temperature 98 F (36.7 C), temperature source Oral, resp. rate 18, height 5\' 5"  (1.651 m), weight 81.364 kg (179 lb 6 oz), SpO2 100.00%. abd inc C/D, LLE w/o tender cords , no signif swelling or tenderness  Disposition: 01-Home or Self Care, cont Lovenox SQ BID with Heme f/u 2/12  Discharge Orders    Future Appointments: Provider: Department: Dept Phone: Center:   04/20/2012 3:00 PM [facility] [phone] None   04/20/2012 3:15 PM [facility] [phone] None   04/20/2012 3:30 PM Levert Feinstein, MD [facility] [phone] None       Medication List     As of 04/15/2012  7:11 AM    ASK your doctor about these medications         ibuprofen 800 MG tablet   Commonly known as: ADVIL,MOTRIN   Take 1 tablet (800 mg total) by mouth every 8 (eight) hours as needed (mild pain).      oxyCODONE-acetaminophen 5-325 MG per tablet   Commonly known as: PERCOCET/ROXICET   Take 1-2 tablets by mouth every 4 (four) hours as needed (moderate to severe pain (when tolerating fluids)).         SignedMeriel Pica 04/15/2012, 7:11 AM

## 2012-04-20 ENCOUNTER — Ambulatory Visit (HOSPITAL_BASED_OUTPATIENT_CLINIC_OR_DEPARTMENT_OTHER): Payer: PRIVATE HEALTH INSURANCE | Admitting: Oncology

## 2012-04-20 ENCOUNTER — Telehealth: Payer: Self-pay | Admitting: Oncology

## 2012-04-20 ENCOUNTER — Ambulatory Visit: Payer: PRIVATE HEALTH INSURANCE

## 2012-04-20 ENCOUNTER — Other Ambulatory Visit (HOSPITAL_BASED_OUTPATIENT_CLINIC_OR_DEPARTMENT_OTHER): Payer: PRIVATE HEALTH INSURANCE | Admitting: Lab

## 2012-04-20 VITALS — BP 133/80 | HR 68 | Temp 98.4°F | Resp 20 | Ht 65.0 in | Wt 180.6 lb

## 2012-04-20 DIAGNOSIS — I82409 Acute embolism and thrombosis of unspecified deep veins of unspecified lower extremity: Secondary | ICD-10-CM

## 2012-04-20 DIAGNOSIS — F17200 Nicotine dependence, unspecified, uncomplicated: Secondary | ICD-10-CM

## 2012-04-20 DIAGNOSIS — Z7901 Long term (current) use of anticoagulants: Secondary | ICD-10-CM

## 2012-04-20 DIAGNOSIS — I8290 Acute embolism and thrombosis of unspecified vein: Secondary | ICD-10-CM

## 2012-04-20 DIAGNOSIS — I824Z9 Acute embolism and thrombosis of unspecified deep veins of unspecified distal lower extremity: Secondary | ICD-10-CM

## 2012-04-20 LAB — MORPHOLOGY
PLT EST: ADEQUATE
RBC Comments: NORMAL

## 2012-04-20 LAB — CBC & DIFF AND RETIC
BASO%: 0.2 % (ref 0.0–2.0)
Basophils Absolute: 0 10*3/uL (ref 0.0–0.1)
EOS%: 1 % (ref 0.0–7.0)
Eosinophils Absolute: 0.1 10*3/uL (ref 0.0–0.5)
HCT: 37.9 % (ref 34.8–46.6)
HGB: 12.3 g/dL (ref 11.6–15.9)
Immature Retic Fract: 3.6 % (ref 1.60–10.00)
LYMPH%: 32.9 % (ref 14.0–49.7)
MCH: 29.4 pg (ref 25.1–34.0)
MCHC: 32.5 g/dL (ref 31.5–36.0)
MCV: 90.5 fL (ref 79.5–101.0)
MONO#: 0.3 10*3/uL (ref 0.1–0.9)
MONO%: 5.2 % (ref 0.0–14.0)
NEUT#: 3.8 10*3/uL (ref 1.5–6.5)
NEUT%: 60.7 % (ref 38.4–76.8)
Platelets: 367 10*3/uL (ref 145–400)
RBC: 4.19 10*6/uL (ref 3.70–5.45)
RDW: 14 % (ref 11.2–14.5)
Retic %: 0.87 % (ref 0.70–2.10)
Retic Ct Abs: 36.45 10*3/uL (ref 33.70–90.70)
WBC: 6.2 10*3/uL (ref 3.9–10.3)
lymph#: 2 10*3/uL (ref 0.9–3.3)

## 2012-04-20 LAB — CHCC SMEAR

## 2012-04-20 NOTE — Progress Notes (Signed)
New Patient Hematology-Oncology Evaluation   [name] [id number] 14-Feb-1961 52 y.o. 04/20/2012  CC: Dr. Richarda Overlie   Reason for referral: Left lower extremity DVT occurring 6 days after elective hysterectomy   HPI:  52 year old woman in excellent health with no major medical or surgical illness. She had an elective hysterectomy with bilateral salpingo-oophorectomy on 04/06/2012 due to menorrhagia and pelvic pain related to fibroids. The day after she got home she began to notice some discomfort in her left calf. This got worse over the next few days and she was reevaluated by Dr. Marcelle Overlie. A lower extremity venous Doppler study done on February 4 showed acute deep venous thrombosis involving the popliteal, posterior tibial, and peroneal veins. She was started on Lovenox 1 mg per kilogram subcutaneous twice daily.   She smokes about one pack of cigarettes every 3 days. She has no signs or symptoms of a collagen vascular disorder. She has had 5 pregnancies without any clotting complications. She has had no major surgical procedures in the past. There is no family history of any clotting disorder in her parents, 3 brothers, or 4 sisters.  Hypercoagulation evaluation to date shows normal protein S activity 111% of control (69-129), protein C activity 166% (75-133), antithrombin level 101% (75-120), homocystine 11.4 (4-15.4), anticardiolipin antibodies not detected, antibodies to beta 2 glycoprotein 1 not detected, factor V Leiden gene mutation not detected. She had a false positive lupus anticoagulants since the study was drawn while she was on Lovenox.  PMH: Past Medical History  Diagnosis Date  . SVD (spontaneous vaginal delivery)     x 5  . Headache     otc meds prn     Past Surgical History  Procedure Laterality Date  . Breast surgery      benign turmor removed  . Tubal ligation    . Abdominal hysterectomy  04/06/2012    Procedure: HYSTERECTOMY ABDOMINAL;  Surgeon:  [name], MD;  Location: [facility];  Service: Gynecology;  Laterality: N/A;  . Salpingoophorectomy  04/06/2012    Procedure: SALPINGO OOPHORECTOMY;  Surgeon: [name], MD;  Location: [facility];  Service: Gynecology;  Laterality: Bilateral;    Allergies: No Known Allergies  Medications: Lovenox 80 mg subcutaneous twice daily. Percocet 5/325 one to 2 every 4 hours when necessary postsurgical pain. MiraLax 17 g daily when necessary.  Social History: She is married and her husband accompanies her today. They have 5 children 3 boys 2 girls who are all healthy.  reports that she has been smoking.  She has never used smokeless tobacco. She reports that  drinks alcohol. She reports that she does not use illicit drugs.  Family History: Mother died at age 35. She had a cardiac condition and had a pacemaker. She was on anticoagulation. Father died of complications of Parkinson's disease last year. She had 3 brothers one died in a motor vehicle accident, she has 4 sisters. All of her surviving siblings are in good health and have no problems with clotting Family History  Problem Relation Age of Onset  . Heart failure Mother   . Hypertension Mother     Review of Systems: Constitutional symptoms: No constitutional symptoms HEENT: No sore throat Respiratory: No cough or dyspnea Cardiovascular:  No chest pain or palpitations Gastrointestinal ROS: No change in bowel habit no hematochezia or melena Genito-Urinary ROS: No hematuria Hematological and Lymphatic: Musculoskeletal: No muscle or bone or joint pain Neurologic: No headache or change in vision, she denies any paresthesias  Dermatologic: No rash or ecchymosis Remaining ROS negative.  Physical Exam: Blood pressure 133/80, pulse 68, temperature 98.4 F (36.9 C), temperature source Oral, resp. rate 20, height 5\' 5"  (1.651 m), weight 180 lb 9.6 oz (81.92 kg). Wt Readings from Last 3 Encounters:  04/20/12 180 lb 9.6 oz (81.92 kg)   04/12/12 179 lb 6 oz (81.364 kg)  03/29/12 186 lb (84.369 kg)    General appearance: Well-nourished African American woman Head: Normal Neck: Normal Lymph nodes: No adenopathy Breasts: Lungs: Clear to auscultation resonant to percussion Heart: Regular rhythm no murmur Abdominal: Soft, upper abdomen is nontender. GU: Forniceal incision suprapubic area well-healed Extremities: Left calf 37 cm right 36, left ankle 21 cm right 20. Calves are nontender. Neurologic: Alert and oriented, PERRLA, optic disc sharp and vessels normal, motor strength 5 over 5, reflexes 1+ symmetric, sensation mildly decreased to vibration by tuning fork exam. Skin: No rash or ecchymosis Vascular: Carotids 1+, no bruits. No cyanosis.    Lab Results: Lab Results  Component Value Date   WBC 6.2 04/20/2012   HGB 12.3 04/20/2012   HCT 37.9 04/20/2012   MCV 90.5 04/20/2012   PLT 367 04/20/2012     Chemistry      Component Value Date/Time   NA 140 04/12/2012 1713   K 3.8 04/12/2012 1713   CL 106 04/12/2012 1713   CO2 24 04/12/2012 1713   BUN 16 04/12/2012 1713   CREATININE 1.23* 04/12/2012 1713      Component Value Date/Time   CALCIUM 9.5 04/12/2012 1713   ALKPHOS 61 04/12/2012 1713   AST 11 04/12/2012 1713   ALT 8 04/12/2012 1713   BILITOT 0.3 04/12/2012 1713    Pro time 13 seconds, PTT 39 seconds   Radiological Studies: See discussion above    Impression and Plan: Uncomplicated left lower extremity DVT occurring about one week after hysterectomy.  Hypercoagulation profile unrevealing to date. I believe that the lupus anticoagulant test is a false positive I couldn't have tissue to reduce received a dose of Lovenox. Antibodies to Cardiolipin  and beta-2 glycoprotein 1 are not detected. She has no signs or symptoms of a collagen vascular disorder. She has no other risk factors for clotting.  Recommendation: I discussed with her today the risk versus benefit of Coumadin compared with the new oral Xa. inhibitor,  Rivaroxaban (Xarelto). I don't think she needs to be on anticoagulation for more than 3 months. I think short-term use of a Xarelto has many advantages over Coumadin. She understands that we do not have an antidote for the [facility] in case of emergency surgery or significant bleeding she would have to be supported with transfusions for 24-48 hours until the drug is out of her system. Fortunately, we have not seen an excessive amount of bleeding complications since the drug was approved 2 years ago. I gave her a prescription for Xarelto 15 mg twice daily for 3 weeks then 20 mg daily to complete 3 months of treatment. I'll see her back in 6 weeks to see how she is doing. I obtained a prothrombin gene mutation study since this did not appear to be part of the original profile that was drawn. We will need to wait until she is off the Xarelto to repeat the lupus anticoagulant test but again I believe this is a false positive.  I gave her printed information about Xarelto. Our pharmacists where not here today. I will see if we have any starter kits for her. She is  advised to continue her Lovenox until she gets the prescription for the Xarelto filled.      Levert Feinstein, MD 04/20/2012, 4:25 PM

## 2012-04-20 NOTE — Patient Instructions (Signed)
Stop Lovenox after dose tonight Start Xarelto (Rivaroxaban) 15 mg twice daily for 21 days tomorrow 04/21/12 Change to 20 mg once a day Xarelto after you finish 3 three weeks at twice a day Call MD for any significant bleeding See Dr Reece Agar in 6 weeks 3/18 @ 4 PM Call for any problems before that

## 2012-04-20 NOTE — Telephone Encounter (Signed)
Gave pt appt for lab and MD for March instead of POF order per MD

## 2012-04-21 ENCOUNTER — Other Ambulatory Visit: Payer: Self-pay | Admitting: *Deleted

## 2012-04-21 DIAGNOSIS — I749 Embolism and thrombosis of unspecified artery: Secondary | ICD-10-CM

## 2012-04-21 MED ORDER — RIVAROXABAN 20 MG PO TABS: 20.0000 mg | ORAL_TABLET | Freq: Every day | ORAL | Status: DC

## 2012-04-21 MED ORDER — RIVAROXABAN 15 MG PO TABS: 15.0000 mg | ORAL_TABLET | Freq: Two times a day (BID) | ORAL | Status: DC

## 2012-04-22 LAB — PROTHROMBIN GENE MUTATION

## 2012-04-22 LAB — D-DIMER, QUANTITATIVE: D-Dimer, Quant: 1.38 ug/mL-FEU — ABNORMAL HIGH (ref 0.00–0.48)

## 2012-04-25 ENCOUNTER — Telehealth: Payer: Self-pay | Admitting: *Deleted

## 2012-04-25 NOTE — Telephone Encounter (Signed)
Spoke with pt.  Let her know that she tests negative for clotting factor 2 gene mutation.

## 2012-04-25 NOTE — Telephone Encounter (Signed)
Message copied by Orbie Hurst on Mon Apr 25, 2012  2:07 PM ------      Message from: Levert Feinstein      Created: Fri Apr 22, 2012  6:43 PM       Call pt: she tests negative for clotting factor 2 gene mutation ------

## 2012-05-18 ENCOUNTER — Other Ambulatory Visit: Payer: PRIVATE HEALTH INSURANCE | Admitting: Lab

## 2012-05-24 ENCOUNTER — Ambulatory Visit (HOSPITAL_BASED_OUTPATIENT_CLINIC_OR_DEPARTMENT_OTHER): Payer: PRIVATE HEALTH INSURANCE | Admitting: Oncology

## 2012-05-24 ENCOUNTER — Telehealth: Payer: Self-pay | Admitting: Oncology

## 2012-05-24 VITALS — BP 140/83 | HR 87 | Temp 97.7°F | Resp 20 | Ht 65.0 in | Wt 182.9 lb

## 2012-05-24 DIAGNOSIS — I82409 Acute embolism and thrombosis of unspecified deep veins of unspecified lower extremity: Secondary | ICD-10-CM

## 2012-05-24 DIAGNOSIS — I8290 Acute embolism and thrombosis of unspecified vein: Secondary | ICD-10-CM

## 2012-05-24 DIAGNOSIS — Z7901 Long term (current) use of anticoagulants: Secondary | ICD-10-CM

## 2012-05-24 NOTE — Telephone Encounter (Signed)
GAve pt appt for lab and MD for May 2014

## 2012-05-25 NOTE — Progress Notes (Signed)
Hematology and Oncology Follow Up Visit  [name] [id number] 03-05-1961 52 y.o. 05/25/2012 5:07 PM   Principle Diagnosis: Encounter Diagnosis  Name Primary?  . VTE (venous thromboembolism) Yes     Interim History:   Short interval followup visit for this pleasant 52 year old woman who was found to have a left lower extremity DVT a few days after discharge from an elective hysterectomy done on 04/06/2012. She was started on Lovenox and Xarelto and currently on Xarelto alone and tolerating this well. She had what I believe is a false-positive lupus-type anticoagulant since the test was done while she was on Lovenox. This will be repeated when she comes off anticoagulants.   Medications: reviewed  Allergies: No Known Allergies  Review of Systems: Constitutional:    Respiratory: No cough or dyspnea Cardiovascular: No chest pain or palpitations  Gastrointestinal: Genito-Urinary:  Musculoskeletal: No calf swelling or tenderness Neurologic: Skin: Remaining ROS negative.  Physical Exam: Blood pressure 140/83, pulse 87, temperature 97.7 F (36.5 C), temperature source Oral, resp. rate 20, height 5\' 5"  (1.651 m), weight 182 lb 14.4 oz (82.963 kg). Wt Readings from Last 3 Encounters:  05/24/12 182 lb 14.4 oz (82.963 kg)  04/20/12 180 lb 9.6 oz (81.92 kg)  04/12/12 179 lb 6 oz (81.364 kg)     General appearance: Well-nourished African American woman HENNT: Pharynx no erythema or exudate Lymph nodes: No adenopathy Breasts: Lungs: Clear to auscultation resonant to percussion Heart: Regular rhythm no murmur Abdomen: Soft, nontender, no mass, no organomegaly Extremities: No edema, no calf tenderness, right calf 47 cm left 48 Vascular: No cyanosis Neurologic: No focal deficit Skin: No rash or ecchymosis  Lab Results: Lab Results  Component Value Date   WBC 6.2 04/20/2012   HGB 12.3 04/20/2012   HCT 37.9 04/20/2012   MCV 90.5 04/20/2012   PLT 367 04/20/2012     Chemistry       Component Value Date/Time   NA 140 04/12/2012 1713   K 3.8 04/12/2012 1713   CL 106 04/12/2012 1713   CO2 24 04/12/2012 1713   BUN 16 04/12/2012 1713   CREATININE 1.23* 04/12/2012 1713      Component Value Date/Time   CALCIUM 9.5 04/12/2012 1713   ALKPHOS 61 04/12/2012 1713   AST 11 04/12/2012 1713   ALT 8 04/12/2012 1713   BILITOT 0.3 04/12/2012 1713       Radiological Studies: No results found.  Impression and Plan: Surgery related thrombosis Plan: Continue Xarelto until the end of April to complete 3 months. She will return when she is off the Xarelto for 2 weeks to repeat the lupus anticoagulant test. See discussion above.   CC:. Dr. Richarda Overlie   Levert Feinstein, MD 3/19/20145:07 PM

## 2012-07-20 ENCOUNTER — Other Ambulatory Visit: Payer: Self-pay | Admitting: *Deleted

## 2012-07-20 ENCOUNTER — Other Ambulatory Visit: Payer: PRIVATE HEALTH INSURANCE | Admitting: Lab

## 2012-07-21 ENCOUNTER — Telehealth: Payer: Self-pay | Admitting: Oncology

## 2012-07-21 NOTE — Telephone Encounter (Signed)
Per 5/14 pof r/s lb b4 f/u pt FTKA 5/14. Pt number d/c. Added lb for 5/19 just before f/u. If pt comes in for f/u 5/19 she will be sent to lb if not letter can be mailed for pt to call to r/s appts. Desk nurse aware.

## 2012-07-25 ENCOUNTER — Ambulatory Visit: Payer: PRIVATE HEALTH INSURANCE | Admitting: Oncology

## 2012-07-25 ENCOUNTER — Other Ambulatory Visit: Payer: PRIVATE HEALTH INSURANCE | Admitting: Lab

## 2012-07-27 ENCOUNTER — Encounter: Payer: Self-pay | Admitting: Oncology

## 2012-07-27 NOTE — Progress Notes (Signed)
52 year old woman who has completed 3 months of anticoagulation for a surgically related DVT. She was pus coming today for a brief visit and to repeat a lupus-type anticoagulant test which I believe was spuriously positive at time of the initial lab work since she was unlikely on Lovenox when the test was done which in validated. She failed to report for her visit today.

## 2012-08-29 ENCOUNTER — Ambulatory Visit: Payer: PRIVATE HEALTH INSURANCE | Admitting: Oncology

## 2012-09-18 ENCOUNTER — Encounter (HOSPITAL_COMMUNITY): Payer: Self-pay | Admitting: Emergency Medicine

## 2012-09-18 ENCOUNTER — Emergency Department (HOSPITAL_COMMUNITY)
Admission: EM | Admit: 2012-09-18 | Discharge: 2012-09-18 | Disposition: A | Discharge status: Home / Self Care | Payer: PRIVATE HEALTH INSURANCE | Attending: Emergency Medicine | Admitting: Emergency Medicine

## 2012-09-18 DIAGNOSIS — K029 Dental caries, unspecified: Secondary | ICD-10-CM | POA: Insufficient documentation

## 2012-09-18 DIAGNOSIS — K089 Disorder of teeth and supporting structures, unspecified: Secondary | ICD-10-CM | POA: Insufficient documentation

## 2012-09-18 DIAGNOSIS — Z8669 Personal history of other diseases of the nervous system and sense organs: Secondary | ICD-10-CM | POA: Insufficient documentation

## 2012-09-18 DIAGNOSIS — Z86718 Personal history of other venous thrombosis and embolism: Secondary | ICD-10-CM | POA: Insufficient documentation

## 2012-09-18 DIAGNOSIS — K0889 Other specified disorders of teeth and supporting structures: Secondary | ICD-10-CM | POA: Insufficient documentation

## 2012-09-18 DIAGNOSIS — F17200 Nicotine dependence, unspecified, uncomplicated: Secondary | ICD-10-CM | POA: Insufficient documentation

## 2012-09-18 DIAGNOSIS — J029 Acute pharyngitis, unspecified: Secondary | ICD-10-CM | POA: Insufficient documentation

## 2012-09-18 HISTORY — DX: Acute embolism and thrombosis of unspecified deep veins of unspecified lower extremity: I82.409

## 2012-09-18 MED ORDER — HYDROCODONE-ACETAMINOPHEN 5-325 MG PO TABS: 1.0000 | ORAL_TABLET | ORAL | Status: DC | PRN

## 2012-09-18 MED ORDER — PENICILLIN V POTASSIUM 500 MG PO TABS: 500.0000 mg | ORAL_TABLET | Freq: Four times a day (QID) | ORAL | Status: DC

## 2012-09-18 NOTE — ED Notes (Signed)
Onset 2 weeks ago dental pain left lower jaw left side of face worsening overtime.  Airway intact bilateral equal chest rise and fall.

## 2012-09-18 NOTE — ED Provider Notes (Signed)
History  This chart was scribed for non-physician practitioner working with Carleene Cooper III, MD. This patient was seen in room TR05C/TR05C and the patient's care was started at 6:53 PM.  CSN: [id number]  Arrival date & time 09/18/12  1824   Chief Complaint  Patient presents with  . Dental Pain    The history is provided by the patient. No language interpreter was used.   HPI Comments: [name] is a 52 y.o. female with a hx of DVT who presents to the Emergency Department complaining of 2 weeks of gradually worsening, sharp, throbbing, "9/10" left lower dental pain with radiation to left jaw and left ear. She reports pressure in her face and states "I feel like my eyes are going to pop out". Pt states that she as several teeth missing, a loose tooth and an associated sore throat. She states that she has used Orajel and Ibuprofen without relief. She denies fever, chills, chest pain, SOB, rhinorrhea, cough, visual disturbance, or any other symptoms. She denies alcohol use and is a current every day smoker of 0.5 packs/day of 38 years.  PCP- None   Past Medical History  Diagnosis Date  . SVD (spontaneous vaginal delivery)     x 5  . Headache(784.0)     otc meds prn   . DVT (deep venous thrombosis)    Past Surgical History  Procedure Laterality Date  . Breast surgery      benign turmor removed  . Tubal ligation    . Abdominal hysterectomy  04/06/2012    Procedure: HYSTERECTOMY ABDOMINAL;  Surgeon: [name], MD;  Location: [facility];  Service: Gynecology;  Laterality: N/A;  . Salpingoophorectomy  04/06/2012    Procedure: SALPINGO OOPHORECTOMY;  Surgeon: [name], MD;  Location: [facility];  Service: Gynecology;  Laterality: Bilateral;   Family History  Problem Relation Age of Onset  . Heart failure Mother   . Hypertension Mother    History  Substance Use Topics  . Smoking status: Current Every Day Smoker -- 0.50 packs/day for 38 years  . Smokeless tobacco:  Never Used  . Alcohol Use: No     Comment:     OB History   Grav Para Term Preterm Abortions TAB SAB Ect Mult Living                 Review of Systems  Constitutional: Negative for fever and chills.  HENT: Positive for sore throat and dental problem. Negative for rhinorrhea.   Eyes: Negative for visual disturbance.  Respiratory: Negative for cough and shortness of breath.   Cardiovascular: Negative for chest pain.    Allergies  Review of patient's allergies indicates no known allergies.  Home Medications   Current Outpatient Rx  Name  Route  Sig  Dispense  Refill  . ibuprofen (ADVIL,MOTRIN) 200 MG tablet   Oral   Take 400 mg by mouth daily as needed for pain.         Marland Kitchen HYDROcodone-acetaminophen (NORCO/VICODIN) 5-325 MG per tablet   Oral   Take 1 tablet by mouth every 4 (four) hours as needed for pain.   20 tablet   0   . penicillin v potassium (VEETID) 500 MG tablet   Oral   Take 1 tablet (500 mg total) by mouth 4 (four) times daily.   40 tablet   0    Triage Vitals: BP 178/76  Pulse 80  Temp(Src) 98.3 F (36.8 C) (Oral)  Resp 18  Ht 5\' 4"  (1.626 m)  Wt 170 lb (77.111 kg)  BMI 29.17 kg/m2  SpO2 98%  Physical Exam  Nursing note and vitals reviewed. Constitutional: She is oriented to person, place, and time. She appears well-developed and well-nourished. No distress.  HENT:  Head: Normocephalic and atraumatic. No trismus in the jaw.  Mouth/Throat: Uvula is midline and mucous membranes are normal. Abnormal dentition. Dental caries present. No dental abscesses or edematous. No posterior oropharyngeal erythema.  Pain reproducible with palpation.   Eyes: Conjunctivae are normal.  Neck: Neck supple.  Neurological: She is alert and oriented to person, place, and time.  Skin: Skin is warm and dry. She is not diaphoretic.  Psychiatric: She has a normal mood and affect.    ED Course  Procedures (including critical care time)  DIAGNOSTIC STUDIES: Oxygen  Saturation is 98% on RA, normal by my interpretation.    COORDINATION OF CARE: 7:15 PM- Pt advised of plan for discharge with Hydrocodone and penicillin and pt agrees.     Labs Reviewed - No data to display  No results found.  1. Pain, dental     MDM  Patient with toothache.  No gross abscess.  Exam unconcerning for Ludwig's angina or spread of infection.  Will treat with penicillin and pain medicine.  Urged patient to follow-up with dentist. Patient is agreeable to plan. Patient is stable at time of discharge          I personally performed the services described in this documentation, which was scribed in my presence. The recorded information has been reviewed and is accurate.    Jeannetta Ellis, PA-C 09/18/12 1942  Jeannetta Ellis, PA-C 09/18/12 1942

## 2012-09-19 NOTE — ED Provider Notes (Signed)
Medical screening examination/treatment/procedure(s) were performed by non-physician practitioner and as supervising physician I was immediately available for consultation/collaboration.   Carleene Cooper III, MD 09/19/12 [phone]

## 2013-02-23 ENCOUNTER — Emergency Department (HOSPITAL_COMMUNITY): Payer: PRIVATE HEALTH INSURANCE

## 2013-02-23 ENCOUNTER — Emergency Department (HOSPITAL_COMMUNITY)
Admission: EM | Admit: 2013-02-23 | Discharge: 2013-02-23 | Disposition: A | Discharge status: Home / Self Care | Payer: PRIVATE HEALTH INSURANCE | Attending: Emergency Medicine | Admitting: Emergency Medicine

## 2013-02-23 ENCOUNTER — Encounter (HOSPITAL_COMMUNITY): Payer: Self-pay | Admitting: Emergency Medicine

## 2013-02-23 DIAGNOSIS — R0981 Nasal congestion: Secondary | ICD-10-CM

## 2013-02-23 DIAGNOSIS — Z79899 Other long term (current) drug therapy: Secondary | ICD-10-CM | POA: Insufficient documentation

## 2013-02-23 DIAGNOSIS — R059 Cough, unspecified: Secondary | ICD-10-CM | POA: Insufficient documentation

## 2013-02-23 DIAGNOSIS — F17200 Nicotine dependence, unspecified, uncomplicated: Secondary | ICD-10-CM | POA: Insufficient documentation

## 2013-02-23 DIAGNOSIS — R05 Cough: Secondary | ICD-10-CM | POA: Insufficient documentation

## 2013-02-23 DIAGNOSIS — J309 Allergic rhinitis, unspecified: Secondary | ICD-10-CM | POA: Insufficient documentation

## 2013-02-23 DIAGNOSIS — Z86718 Personal history of other venous thrombosis and embolism: Secondary | ICD-10-CM | POA: Insufficient documentation

## 2013-02-23 DIAGNOSIS — R0602 Shortness of breath: Secondary | ICD-10-CM | POA: Insufficient documentation

## 2013-02-23 DIAGNOSIS — J3489 Other specified disorders of nose and nasal sinuses: Secondary | ICD-10-CM | POA: Insufficient documentation

## 2013-02-23 DIAGNOSIS — R1110 Vomiting, unspecified: Secondary | ICD-10-CM | POA: Insufficient documentation

## 2013-02-23 DIAGNOSIS — Z8669 Personal history of other diseases of the nervous system and sense organs: Secondary | ICD-10-CM | POA: Insufficient documentation

## 2013-02-23 MED ORDER — GUAIFENESIN-CODEINE 100-10 MG/5ML PO SOLN
10.0000 mL | Freq: Once | ORAL | Status: AC
  Administered 2013-02-23: 10 mL via ORAL
  Filled 2013-02-23: qty 10

## 2013-02-23 MED ORDER — ALBUTEROL SULFATE HFA 108 (90 BASE) MCG/ACT IN AERS
2.0000 | INHALATION_SPRAY | Freq: Once | RESPIRATORY_TRACT | Status: AC
  Administered 2013-02-23: 2 via RESPIRATORY_TRACT
  Filled 2013-02-23: qty 6.7

## 2013-02-23 MED ORDER — FEXOFENADINE-PSEUDOEPHED ER 60-120 MG PO TB12: 1.0000 | ORAL_TABLET | Freq: Two times a day (BID) | ORAL | Status: DC

## 2013-02-23 MED ORDER — GUAIFENESIN-CODEINE 100-10 MG/5ML PO SYRP: 5.0000 mL | ORAL_SOLUTION | Freq: Three times a day (TID) | ORAL | Status: DC | PRN

## 2013-02-23 NOTE — ED Notes (Signed)
Patient c/o nasal and chest congestion onset 2 days ago, states she starts coughing and then she vomits.

## 2013-03-07 NOTE — ED Provider Notes (Signed)
CSN: [id number]     Arrival date & time 02/23/13  0720 History   First MD Initiated Contact with Patient 02/23/13 [phone]     Chief Complaint  Patient presents with  . Nasal Congestion   (Consider location/radiation/quality/duration/timing/severity/associated sxs/prior Treatment) Patient is a 52 y.o. female presenting with cough. The history is provided by the patient.  Cough Cough characteristics:  Harsh and vomit-inducing Severity:  Moderate Onset quality:  Gradual Duration:  2 days Timing:  Intermittent Progression:  Unchanged Chronicity:  New Smoker: yes   Relieved by:  None tried Ineffective treatments:  Rest Associated symptoms: rhinorrhea and sinus congestion   Associated symptoms: no chest pain, no ear fullness, no ear pain, no fever, no rash and no wheezing     52yF with 2d gx of sinus congestion and cough. COughing fits to point of vomiting. No fever or chills. No sob. No abdominal pain. No diarrhea. No sick contacts.   Past Medical History  Diagnosis Date  . SVD (spontaneous vaginal delivery)     x 5  . Headache(784.0)     otc meds prn   . DVT (deep venous thrombosis)    Past Surgical History  Procedure Laterality Date  . Breast surgery      benign turmor removed  . Tubal ligation    . Abdominal hysterectomy  04/06/2012    Procedure: HYSTERECTOMY ABDOMINAL;  Surgeon: [name], MD;  Location: [facility];  Service: Gynecology;  Laterality: N/A;  . Salpingoophorectomy  04/06/2012    Procedure: SALPINGO OOPHORECTOMY;  Surgeon: [name], MD;  Location: [facility];  Service: Gynecology;  Laterality: Bilateral;   Family History  Problem Relation Age of Onset  . Heart failure Mother   . Hypertension Mother    History  Substance Use Topics  . Smoking status: Current Every Day Smoker -- 0.50 packs/day for 38 years  . Smokeless tobacco: Never Used  . Alcohol Use: No     Comment:     OB History   Grav Para Term Preterm Abortions TAB SAB Ect Mult Living                 Review of Systems  Constitutional: Negative for fever.  HENT: Positive for rhinorrhea. Negative for ear pain.   Respiratory: Positive for cough. Negative for wheezing.   Cardiovascular: Negative for chest pain.  Skin: Negative for rash.    All systems reviewed and negative, other than as noted in HPI or above.  Allergies  Review of patient's allergies indicates no known allergies.  Home Medications   Current Outpatient Rx  Name  Route  Sig  Dispense  Refill  . guaiFENesin (ROBITUSSIN) 100 MG/5ML liquid   Oral   Take 300 mg by mouth 3 (three) times daily as needed for cough or congestion.         Marland Kitchen ibuprofen (ADVIL,MOTRIN) 200 MG tablet   Oral   Take 400 mg by mouth every 6 (six) hours as needed for mild pain.         . fexofenadine-pseudoephedrine (ALLEGRA-D) 60-120 MG per tablet   Oral   Take 1 tablet by mouth 2 (two) times daily.   20 tablet   0   . guaiFENesin-codeine (ROBITUSSIN AC) 100-10 MG/5ML syrup   Oral   Take 5 mLs by mouth 3 (three) times daily as needed for cough.   120 mL   0    BP 159/78  Pulse 82  Temp(Src) 97.8 F (36.6 C) (Oral)  Resp 16  Ht 5\' 4"  (1.626 m)  Wt 140 lb (63.504 kg)  BMI 24.02 kg/m2  SpO2 97% Physical Exam  Nursing note and vitals reviewed. Constitutional: She appears well-developed and well-nourished. No distress.  HENT:  Head: Normocephalic and atraumatic.  Boggy nasal mucosa  Eyes: Conjunctivae are normal. Right eye exhibits no discharge. Left eye exhibits no discharge.  Neck: Neck supple.  Cardiovascular: Normal rate, regular rhythm and normal heart sounds.  Exam reveals no gallop and no friction rub.   No murmur heard. Pulmonary/Chest: Effort normal and breath sounds normal. No respiratory distress.  Abdominal: Soft. She exhibits no distension. There is no tenderness.  Musculoskeletal: She exhibits no edema and no tenderness.  Lower extremities symmetric as compared to each other. No calf tenderness.  Negative Homan's. No palpable cords.   Neurological: She is alert.  Skin: Skin is warm and dry.  Psychiatric: She has a normal mood and affect. Her behavior is normal. Thought content normal.    ED Course  Procedures (including critical care time) Labs Review Labs Reviewed - No data to display Imaging Review No results found.  Dg Chest 2 View  02/23/2013   CLINICAL DATA:  Cough.  Shortness of breath.  EXAM: CHEST  2 VIEW  COMPARISON:  06/13/2011.  FINDINGS: The heart size and mediastinal contours are within normal limits. Both lungs are clear. The visualized skeletal structures are unremarkable.  IMPRESSION: No active cardiopulmonary disease.   Electronically Signed   By: Maisie Fus  Register   On: 02/23/2013 08:10  EKG Interpretation   None       MDM   1. Cough   2. Post-tussive emesis   3. Head congestion    52yF with likely viral URI. Vomiting consistent with post-tussive emesis. Abdominal exam benign. CXR clear. No increased wOB. Plan symptomatic tx.     Raeford Razor, MD 03/07/13 [phone]

## 2014-10-09 ENCOUNTER — Emergency Department (HOSPITAL_COMMUNITY)
Admission: EM | Admit: 2014-10-09 | Discharge: 2014-10-09 | Disposition: A | Discharge status: Home / Self Care | Payer: PRIVATE HEALTH INSURANCE | Attending: Emergency Medicine | Admitting: Emergency Medicine

## 2014-10-09 ENCOUNTER — Emergency Department (HOSPITAL_COMMUNITY): Payer: PRIVATE HEALTH INSURANCE

## 2014-10-09 ENCOUNTER — Emergency Department (HOSPITAL_BASED_OUTPATIENT_CLINIC_OR_DEPARTMENT_OTHER)
Admit: 2014-10-09 | Discharge: 2014-10-09 | Disposition: A | Discharge status: Home / Self Care | Payer: PRIVATE HEALTH INSURANCE | Attending: Emergency Medicine | Admitting: Emergency Medicine

## 2014-10-09 ENCOUNTER — Encounter (HOSPITAL_COMMUNITY): Payer: Self-pay | Admitting: *Deleted

## 2014-10-09 DIAGNOSIS — R079 Chest pain, unspecified: Secondary | ICD-10-CM | POA: Diagnosis not present

## 2014-10-09 DIAGNOSIS — I82401 Acute embolism and thrombosis of unspecified deep veins of right lower extremity: Secondary | ICD-10-CM | POA: Diagnosis not present

## 2014-10-09 DIAGNOSIS — Z720 Tobacco use: Secondary | ICD-10-CM | POA: Diagnosis not present

## 2014-10-09 DIAGNOSIS — Z79899 Other long term (current) drug therapy: Secondary | ICD-10-CM | POA: Insufficient documentation

## 2014-10-09 DIAGNOSIS — R200 Anesthesia of skin: Secondary | ICD-10-CM | POA: Diagnosis not present

## 2014-10-09 DIAGNOSIS — M7989 Other specified soft tissue disorders: Secondary | ICD-10-CM

## 2014-10-09 DIAGNOSIS — M79661 Pain in right lower leg: Secondary | ICD-10-CM | POA: Diagnosis present

## 2014-10-09 LAB — CBC WITH DIFFERENTIAL/PLATELET
Basophils Absolute: 0 10*3/uL (ref 0.0–0.1)
Basophils Relative: 0 % (ref 0–1)
Eosinophils Absolute: 0.1 10*3/uL (ref 0.0–0.7)
Eosinophils Relative: 2 % (ref 0–5)
HCT: 41.9 % (ref 36.0–46.0)
Hemoglobin: 13.4 g/dL (ref 12.0–15.0)
Lymphocytes Relative: 46 % (ref 12–46)
Lymphs Abs: 1.9 10*3/uL (ref 0.7–4.0)
MCH: 29.6 pg (ref 26.0–34.0)
MCHC: 32 g/dL (ref 30.0–36.0)
MCV: 92.7 fL (ref 78.0–100.0)
Monocytes Absolute: 0.2 10*3/uL (ref 0.1–1.0)
Monocytes Relative: 5 % (ref 3–12)
Neutro Abs: 2 10*3/uL (ref 1.7–7.7)
Neutrophils Relative %: 47 % (ref 43–77)
Platelets: 244 10*3/uL (ref 150–400)
RBC: 4.52 MIL/uL (ref 3.87–5.11)
RDW: 13.7 % (ref 11.5–15.5)
WBC: 4.3 10*3/uL (ref 4.0–10.5)

## 2014-10-09 LAB — BASIC METABOLIC PANEL
Anion gap: 8 (ref 5–15)
BUN: 10 mg/dL (ref 6–20)
CO2: 28 mmol/L (ref 22–32)
Calcium: 9.5 mg/dL (ref 8.9–10.3)
Chloride: 105 mmol/L (ref 101–111)
Creatinine, Ser: 0.83 mg/dL (ref 0.44–1.00)
GFR calc Af Amer: >60 mL/min (ref >60)
GFR calc non Af Amer: >60 mL/min (ref >60)
Glucose, Bld: 115 mg/dL — ABNORMAL HIGH (ref 65–99)
Potassium: 3.5 mmol/L (ref 3.5–5.1)
Sodium: 141 mmol/L (ref 135–145)

## 2014-10-09 MED ORDER — IOHEXOL 350 MG/ML SOLN
100.0000 mL | Freq: Once | INTRAVENOUS | Status: AC | PRN
  Administered 2014-10-09: 100 mL via INTRAVENOUS

## 2014-10-09 MED ORDER — RIVAROXABAN (XARELTO) VTE STARTER PACK (15 & 20 MG): ORAL_TABLET | ORAL | Status: DC

## 2014-10-09 MED ORDER — ENOXAPARIN SODIUM 80 MG/0.8ML ~~LOC~~ SOLN
1.0000 mg/kg | Freq: Once | SUBCUTANEOUS | Status: AC
  Administered 2014-10-09: 75 mg via SUBCUTANEOUS
  Filled 2014-10-09: qty 0.8

## 2014-10-09 NOTE — Discharge Instructions (Signed)
Deep Vein Thrombosis °A deep vein thrombosis (DVT) is a blood clot that develops in the deep, larger veins of the leg, arm, or pelvis. These are more dangerous than clots that might form in veins near the surface of the body. A DVT can lead to serious and even life-threatening complications if the clot breaks off and travels in the bloodstream to the lungs.  °A DVT can damage the valves in your leg veins so that instead of flowing upward, the blood pools in the lower leg. This is called post-thrombotic syndrome, and it can result in pain, swelling, discoloration, and sores on the leg. °CAUSES °Usually, several things contribute to the formation of blood clots. Contributing factors include: °· The flow of blood slows down. °· The inside of the vein is damaged in some way. °· You have a condition that makes blood clot more easily. °RISK FACTORS °Some people are more likely than others to develop blood clots. Risk factors include:  °· Smoking. °· Being overweight (obese). °· Sitting or lying still for a long time. This includes long-distance travel, paralysis, or recovery from an illness or surgery. °Other factors that increase risk are:  °· Older age, especially over 75 years of age. °· Having a family history of blood clots or if you have already had a blot clot. °· Having major or lengthy surgery. This is especially true for surgery on the hip, knee, or belly (abdomen). Hip surgery is particularly high risk. °· Having a long, thin tube (catheter) placed inside a vein during a medical procedure. °· Breaking a hip or leg. °· Having cancer or cancer treatment. °· Pregnancy and childbirth. °¨ Hormone changes make the blood clot more easily during pregnancy. °¨ The fetus puts pressure on the veins of the pelvis. °¨ There is a risk of injury to veins during delivery or a caesarean delivery. The risk is highest just after childbirth. °· Medicines containing the female hormone estrogen. This includes birth control pills and  hormone replacement therapy. °· Other circulation or heart problems. ° °SIGNS AND SYMPTOMS °When a clot forms, it can either partially or totally block the blood flow in that vein. Symptoms of a DVT can include: °· Swelling of the leg or arm, especially if one side is much worse. °· Warmth and redness of the leg or arm, especially if one side is much worse. °· Pain in an arm or leg. If the clot is in the leg, symptoms may be more noticeable or worse when standing or walking. °The symptoms of a DVT that has traveled to the lungs (pulmonary embolism, PE) usually start suddenly and include: °· Shortness of breath. °· Coughing. °· Coughing up blood or blood-tinged mucus. °· Chest pain. The chest pain is often worse with deep breaths. °· Rapid heartbeat. °Anyone with these symptoms should get emergency medical treatment right away. Do not wait to see if the symptoms will go away. Call your local emergency services (911 in the [place] if you have these symptoms. Do not drive yourself to the hospital. °DIAGNOSIS °If a DVT is suspected, your health care provider will take a full medical history and perform a physical exam. Tests that also may be required include: °· Blood tests, including studies of the clotting properties of the blood. °· Ultrasound to see if you have clots in your legs or lungs. °· X-rays to show the flow of blood when dye is injected into the veins (venogram). °· Studies of your lungs if you have any   chest symptoms. °PREVENTION °· Exercise the legs regularly. Take a brisk 30-minute walk every day. °· Maintain a weight that is appropriate for your height. °· Avoid sitting or lying in bed for long periods of time without moving your legs. °· Women, particularly those over the age of 35 years, should consider the risks and benefits of taking estrogen medicines, including birth control pills. °· Do not smoke, especially if you take estrogen medicines. °· Long-distance travel can increase your risk of DVT. You  should exercise your legs by walking or pumping the muscles every hour. °· Many of the risk factors above relate to situations that exist with hospitalization, either for illness, injury, or elective surgery. Prevention may include medical and nonmedical measures. °¨ Your health care provider will assess you for the need for venous thromboembolism prevention when you are admitted to the hospital. If you are having surgery, your surgeon will assess you the day of or day after surgery. °TREATMENT °Once identified, a DVT can be treated. It can also be prevented in some circumstances. Once you have had a DVT, you may be at increased risk for a DVT in the future. The most common treatment for DVT is blood-thinning (anticoagulant) medicine, which reduces the blood's tendency to clot. Anticoagulants can stop new blood clots from forming and stop old clots from growing. They cannot dissolve existing clots. Your body does this by itself over time. Anticoagulants can be given by mouth, through an IV tube, or by injection. Your health care provider will determine the best program for you. Other medicines or treatments that may be used are: °· Heparin or related medicines (low molecular weight heparin) are often the first treatment for a blood clot. They act quickly. However, they cannot be taken orally and must be given either in shot form or by IV tube. °¨ Heparin can cause a fall in a component of blood that stops bleeding and forms blood clots (platelets). You will be monitored with blood tests to be sure this does not occur. °· Warfarin is an anticoagulant that can be swallowed. It takes a few days to start working, so usually heparin or related medicines are used in combination. Once warfarin is working, heparin is usually stopped. °· Factor Xa inhibitor medicines, such as rivaroxaban and apixaban, also reduce blood clotting. These medicines are taken orally and can often be used without heparin or related  medicines. °· Less commonly, clot dissolving drugs (thrombolytics) are used to dissolve a DVT. They carry a high risk of bleeding, so they are used mainly in severe cases where your life or a part of your body is threatened. °· Very rarely, a blood clot in the leg needs to be removed surgically. °· If you are unable to take anticoagulants, your health care provider may arrange for you to have a filter placed in a main vein in your abdomen. This filter prevents clots from traveling to your lungs. °HOME CARE INSTRUCTIONS °· Take all medicines as directed by your health care provider. °· Learn as much as you can about DVT. °· Wear a medical alert bracelet or carry a medical alert card. °· Ask your health care provider how soon you can go back to normal activities. It is important to stay active to prevent blood clots. If you are on anticoagulant medicine, avoid contact sports. °· It is very important to exercise. This is especially important while traveling, sitting, or standing for long periods of time. Exercise your legs by walking or by   tightening and relaxing your leg muscles regularly. Take frequent walks. °· You may need to wear compression stockings. These are tight elastic stockings that apply pressure to the lower legs. This pressure can help keep the blood in the legs from clotting. °Taking Warfarin °Warfarin is a daily medicine that is taken by mouth. Your health care provider will advise you on the length of treatment (usually 3-6 months, sometimes lifelong). If you take warfarin: °· Understand how to take warfarin and foods that can affect how warfarin works in your body. °· Too much and too little warfarin are both dangerous. Too much warfarin increases the risk of bleeding. Too little warfarin continues to allow the risk for blood clots. °Warfarin and Regular Blood Testing °While taking warfarin, you will need to have regular blood tests to measure your blood clotting time. These blood tests usually  include both the prothrombin time (PT) and international normalized ratio (INR) tests. The PT and INR results allow your health care provider to adjust your dose of warfarin. It is very important that you have your PT and INR tested as often as directed by your health care provider.    °Warfarin and Your Diet °Avoid major changes in your diet, or notify your health care provider before changing your diet. Arrange a visit with a registered dietitian to answer your questions. Many foods, especially foods high in vitamin K, can interfere with warfarin and affect the PT and INR results. You should eat a consistent amount of foods high in vitamin K. Foods high in vitamin K include:  °· Spinach, kale, broccoli, cabbage, collard and turnip greens, Brussels sprouts, peas, cauliflower, seaweed, and parsley. °· Beef and pork liver. °· Green tea. °· Soybean oil. °Warfarin with Other Medicines °Many medicines can interfere with warfarin and affect the PT and INR results. You must: °· Tell your health care provider about any and all medicines, vitamins, and supplements you take, including aspirin and other over-the-counter anti-inflammatory medicines. Be especially cautious with aspirin and anti-inflammatory medicines. Ask your health care provider before taking these. °· Do not take or discontinue any prescribed or over-the-counter medicine except on the advice of your health care provider or pharmacist. °Warfarin Side Effects °Warfarin can have side effects, such as easy bruising and difficulty stopping bleeding. Ask your health care provider or pharmacist about other side effects of warfarin. You will need to: °· Hold pressure over cuts for longer than usual. °· Notify your dentist and other health care providers that you are taking warfarin before you undergo any procedures where bleeding may occur. °Warfarin with Alcohol and Tobacco  °· Drinking alcohol frequently can increase the effect of warfarin, leading to excess  bleeding. It is best to avoid alcoholic drinks or to consume only very small amounts while taking warfarin. Notify your health care provider if you change your alcohol intake.   °· Do not use any tobacco products including cigarettes, chewing tobacco, or electronic cigarettes. If you smoke, quit. Ask your health care provider for help with quitting smoking. °Alternative Medicines to Warfarin: Factor Xa Inhibitor Medicines °· These blood-thinning medicines are taken by mouth, usually for several weeks or longer. It is important to take the medicine every single day at the same time each day. °· There are no regular blood tests required when using these medicines. °· There are fewer food and drug interactions than with warfarin. °· The side effects of this class of medicine are similar to those of warfarin, including excessive bruising or bleeding. Ask your   health care provider or pharmacist about other potential side effects. SEEK MEDICAL CARE IF:  You notice a rapid heartbeat.  You feel weaker or more tired than usual.  You feel faint.  You notice increased bruising.  You feel your symptoms are not getting better in the time expected.  You believe you are having side effects of medicine. SEEK IMMEDIATE MEDICAL CARE IF:  You have chest pain.  You have trouble breathing.  You have new or increased swelling or pain in one leg.  You cough up blood.  You notice blood in vomit, in a bowel movement, or in urine. MAKE SURE YOU:  Understand these instructions.  Will watch your condition.  Will get help right away if you are not doing well or get worse. Document Released: 02/23/2005 Document Revised: 07/10/2013 Document Reviewed: 10/31/2012 [facility] Patient Information 2015 [place], [place]. This information is not intended to replace advice given to you by your health care provider. Make sure you discuss any questions you have with your health care provider.  Rivaroxaban oral tablets What  is this medicine? RIVAROXABAN (ri va ROX a ban) is an anticoagulant (blood thinner). It is used to treat blood clots in the lungs or in the veins. It is also used after knee or hip surgeries to prevent blood clots. It is also used to lower the chance of stroke in people with a medical condition called atrial fibrillation. This medicine may be used for other purposes; ask your health care provider or pharmacist if you have questions. COMMON BRAND NAME(S): Xarelto, Xarelto Starter Pack What should I tell my health care provider before I take this medicine? They need to know if you have any of these conditions: -bleeding disorders -bleeding in the brain -blood in your stools (black or tarry stools) or if you have blood in your vomit -history of stomach bleeding -kidney disease -liver disease -low blood counts, like low white cell, platelet, or red cell counts -recent or planned spinal or epidural procedure -take medicines that treat or prevent blood clots -an unusual or allergic reaction to rivaroxaban, other medicines, foods, dyes, or preservatives -pregnant or trying to get pregnant -breast-feeding How should I use this medicine? Take this medicine by mouth with a glass of water. Follow the directions on the prescription label. Take your medicine at regular intervals. Do not take it more often than directed. Do not stop taking except on your doctor's advice. Stopping this medicine may increase your risk of a blot clot. Be sure to refill your prescription before you run out of medicine. If you are taking this medicine after hip or knee replacement surgery, take it with or without food. If you are taking this medicine for atrial fibrillation, take it with your evening meal. If you are taking this medicine to treat blood clots, take it with food at the same time each day. If you are unable to swallow your tablet, you may crush the tablet and mix it in applesauce. Then, immediately eat the applesauce.  You should eat more food right after you eat the applesauce containing the crushed tablet. Talk to your pediatrician regarding the use of this medicine in children. Special care may be needed. Overdosage: If you think you have taken too much of this medicine contact a poison control center or emergency room at once. NOTE: This medicine is only for you. Do not share this medicine with others. What if I miss a dose? If you take your medicine once a day and miss  a dose, take the missed dose as soon as you remember. If you take your medicine twice a day and miss a dose, take the missed dose immediately. In this instance, 2 tablets may be taken at the same time. The next day you should take 1 tablet twice a day as directed. What may interact with this medicine? -aspirin and aspirin-like medicines -certain antibiotics like erythromycin, azithromycin, and clarithromycin -certain medicines for fungal infections like ketoconazole and itraconazole -certain medicines for irregular heart beat like amiodarone, quinidine, dronedarone -certain medicines for seizures like carbamazepine, phenytoin -certain medicines that treat or prevent blood clots like warfarin, enoxaparin, and dalteparin -conivaptan -diltiazem -felodipine -indinavir -lopinavir; ritonavir -NSAIDS, medicines for pain and inflammation, like ibuprofen or naproxen -ranolazine -rifampin -ritonavir -St. John's wort -verapamil This list may not describe all possible interactions. Give your health care provider a list of all the medicines, herbs, non-prescription drugs, or dietary supplements you use. Also tell them if you smoke, drink alcohol, or use illegal drugs. Some items may interact with your medicine. What should I watch for while using this medicine? Visit your doctor or health care professional for regular checks on your progress. Your condition will be monitored carefully while you are receiving this medicine. Notify your doctor or  health care professional and seek emergency treatment if you develop breathing problems; changes in vision; chest pain; severe, sudden headache; pain, swelling, warmth in the leg; trouble speaking; sudden numbness or weakness of the face, arm, or leg. These can be signs that your condition has gotten worse. If you are going to have surgery, tell your doctor or health care professional that you are taking this medicine. Tell your health care professional that you use this medicine before you have a spinal or epidural procedure. Sometimes people who take this medicine have bleeding problems around the spine when they have a spinal or epidural procedure. This bleeding is very rare. If you have a spinal or epidural procedure while on this medicine, call your health care professional immediately if you have back pain, numbness or tingling (especially in your legs and feet), muscle weakness, paralysis, or loss of bladder or bowel control. Avoid sports and activities that might cause injury while you are using this medicine. Severe falls or injuries can cause unseen bleeding. Be careful when using sharp tools or knives. Consider using an Copy. Take special care brushing or flossing your teeth. Report any injuries, bruising, or red spots on the skin to your doctor or health care professional. What side effects may I notice from receiving this medicine? Side effects that you should report to your doctor or health care professional as soon as possible: -allergic reactions like skin rash, itching or hives, swelling of the face, lips, or tongue -back pain -redness, blistering, peeling or loosening of the skin, including inside the mouth -signs and symptoms of bleeding such as bloody or black, tarry stools; red or dark-brown urine; spitting up blood or brown material that looks like coffee grounds; red spots on the skin; unusual bruising or bleeding from the eye, gums, or nose Side effects that usually do not  require medical attention (Report these to your doctor or health care professional if they continue or are bothersome.): -dizziness -muscle pain This list may not describe all possible side effects. Call your doctor for medical advice about side effects. You may report side effects to FDA at 1-800-FDA-1088. Where should I keep my medicine? Keep out of the reach of children. Store at room temperature  between 15 and 30 degrees C (59 and 86 degrees F). Throw away any unused medicine after the expiration date. NOTE: This sheet is a summary. It may not cover all possible information. If you have questions about this medicine, talk to your doctor, pharmacist, or health care provider.  2015, [employer]vier/[employer]. (2013-06-15 18:47:48)   Emergency Department Resource Guide 1) Find a Doctor and Pay Out of Pocket Although you won't have to find out who is covered by your insurance plan, it is a good idea to ask around and get recommendations. You will then need to call the office and see if the doctor you have chosen will accept you as a new patient and what types of options they offer for patients who are self-pay. Some doctors offer discounts or will set up payment plans for their patients who do not have insurance, but you will need to ask so you aren't surprised when you get to your appointment.  2) Contact Your Local Health Department Not all health departments have doctors that can see patients for sick visits, but many do, so it is worth a call to see if yours does. If you don't know where your local health department is, you can check in your phone book. The CDC also has a tool to help you locate your state's health department, and many state websites also have listings of all of their local health departments.  3) Find a [facility] If your illness is not likely to be very severe or complicated, you may want to try a walk in clinic. These are popping up all over the country in pharmacies,  drugstores, and shopping centers. They're usually staffed by nurse practitioners or physician assistants that have been trained to treat common illnesses and complaints. They're usually fairly quick and inexpensive. However, if you have serious medical issues or chronic medical problems, these are probably not your best option.  No Primary Care Doctor: - Call Health Connect at  [phone] - they can help you locate a primary care doctor that  accepts your insurance, provides certain services, etc. - Physician Referral Service- [phone]  Chronic Pain Problems: Organization         Address  Phone   Notes  [facility]  [phone] Patients need to be referred by their primary care doctor.   Medication Assistance: Organization         Address  Phone   Notes  [facility] Medication [facility] [address] [place], [place] [address] [phone] --Must be a resident of [facility] -- Must have NO insurance coverage whatsoever (no Medicaid/ Medicare, etc.) -- The pt. MUST have a primary care doctor that directs their care regularly and follows them in the community   [facility]  [phone]   [employer]  [phone]    Agencies that provide inexpensive medical care: Organization         Address  Phone   Notes  [place]  [phone]   [facility]    [phone]   [facility] [place], [place] [address] [phone]   [place] [address], [place] [phone]   [facility]anned Parenthood    [phone]   [facility]    [phone]   [place] and [place], [place] Phone:  [phone], Fax:  [phone]  Hours of Operation:  9 am - 6 pm, M-F.  Also accepts Medicaid/Medicare and self-pay.  [facility] for [place], [address], [place] Phone:  [phone], Fax: [phone]. Hours of Operation:  8:30 am - 5:30 pm, M-F.  Also accepts Medicaid and self-pay.  [facility] [address], [place] Phone: [phone]   [place], [place], [place] [phone], Ext. [phone] Mondays & Thursdays: 7-9 AM.  First 15 patients are seen on a first come, first serve basis.    [place] Providers:  Organization         Address  Phone   Notes  [facility] [address], [place] [phone] Also accepts self-pay patients.  [facility] [address], [place]  [phone]   [place], [address], [place] [phone]   [facility] [address], [place] [phone]   Lucianne Lei [address], [place]   [phone] Only accepts [place] patients after they have their name applied to their card.   Self-Pay (no insurance) in [facility]:  Organization         Address  Phone   Notes  Sickle Cell Patients, [facility] [place] [phone]   [facility] [place] [phone]   [facility] [place]  [place], [place], [place] [phone]   [facility]/Dr. Osei-Bonsu  [address], [place] or [address], [place] [phone] Phone number for both [place] and [place] locations is the same.  [facility] and [facility] [address] [place] [phone]   [facility] [address] [place] or [address] [phone]   [facility] [address], [place] [phone], phone; [phone], fax Sees patients 1st and 3rd Saturday of every month.  Must not qualify for public  or private insurance (i.e. Medicaid, Medicare, [place] Health Choice, Veterans' Benefits)  Household income should be no more than 200% of the poverty level The clinic cannot treat you if you are pregnant or think you are pregnant  Sexually transmitted diseases are not treated at the clinic.    Dental Care: Organization         Address  Phone  Notes  [facility] Department of [facility] [place] [phone] Accepts children up to age 38 who are enrolled in [place] or [place]; pregnant women with a Medicaid card; and children who have applied for Medicaid or [place] Health Choice, but were declined, whose parents can pay a reduced fee at time of service.  [facility] Department of [facility]  [address], [place] [phone] Accepts children up to age 42 who are enrolled in [place] or [place]; pregnant women with a Medicaid card; and children who have applied for Medicaid or [place] [employer] Choice, but were declined, whose parents can pay a reduced fee at time of service.  [facility]  [place] [phone] Patients are seen by appointment only. Walk-ins are not accepted. [place] will  see patients 51 years of age and older. Monday - Tuesday (8am-5pm) Most Wednesdays (8:30-5pm) $30 per visit, cash only  [facility]  [address], [facility] [phone] Patients are seen by appointment only. Walk-ins are not accepted. [place] will see patients 64 years of age and older. One Wednesday Evening (Monthly: Volunteer Based).  $30 per visit, cash only  [place]  [phone] for adults; Children under age 14, call Graduate Pediatric Dentistry at [phone]. Children aged 87-14, please call [phone] to request a pediatric application.  Dental services are provided in all areas of  dental care including fillings, crowns and bridges, complete and partial dentures, implants, gum treatment, root canals, and extractions. Preventive care is also provided. Treatment is provided to both adults and children. Patients are selected via a lottery and there is often a waiting list.   [facility] [address], [place]  [phone] [email or web address]   [facility] [address] [place], [place] [phone], Ext. [phone] Second and Fourth Thursday of each month, opens at 6:30 AM; Clinic ends at 9 AM.  Patients are seen on a first-come first-served basis, and a limited number are seen during each clinic.   [facility]  [address] Hillard Danker [place], [place] [phone]   Eligibility Requirements You must have lived in [place], [place], or [place] counties for at least the last three months.   You cannot be eligible for state or federal sponsored [employer], including [employer], [place], or [employer].   You generally cannot be eligible for healthcare insurance through your employer.    How to apply: Eligibility screenings are held every Tuesday and Wednesday afternoon from 1:00 pm until 4:00 pm. You do not need an appointment for the interview!  [facility] [address], [place], [place]   [place]  [place] Department  [place]  [phone]    Behavioral Health Resources in the Community: Intensive Outpatient Programs Organization         Address  Phone  Notes  [place]. [address] [place], [place] [phone]   [facility] Outpatient [address], [place], [place]   ADS: Alcohol & Drug S[facility] [address], [place], [place]   [place] [address],  [place], [place] or  [phone]   Substance Abuse Resources Organization         Address  Phone  Notes  Alcohol and Drug Services  [phone]   [place]  [phone]   [facility] [place]   Chinita Pester  [phone]   Residential & Outpatient Substance Abuse Program  [phone]   Psychological Services Organization         Address  Phone  Notes  [facility] [place]  [place]  [phone]   [place] [address] [place] or [phone]    Mobile Crisis Teams Organization         Address  Phone  Notes  [facility], Mobile Crisis Care Unit  [phone]   [facility]  [address] [place], [place]   [facility] [address] [place] [phone]    Self-Help/Support Groups Organization  Address  Phone             Notes  [place]. of [place] - variety of support groups  [place] Call for more information  Narcotics Anonymous (NA), [facility] [address], [employer] [place]  2 meetings at this location   Special educational needs teacher         Address  Phone  Notes  [facility] [place],    [place]  [phone]   [facility]  [address], [place] [id number], [place], [place]   [place], [place] [phone] Admissions: 8am-3pm M-F  Incentives Substance [place] [address],    [place], [place] [phone]   [facility] [address] [place], [place], [place]   [facility] [address]  [place], [place]   [facility] - Intensive Outpatient [address], [place], [place]   [facility] [address]  [place], [place] [phone] or [phone]   Residential  Treatment Services (RTS) [address], [place], [place] Accepts Medicaid  [facility] [place] [address]  [place] [phone] Substance Abuse/Addiction Treatment   [facility] Organization         Address  Phone  Notes  [facility]  [phone]   Domenic Schwab, PhD [address] Arlis Porta [place], [place]   [phone] or [phone]   [place], [place] [phone]   [facility] [address], [place], [place] [phone] Insurance/Medicaid/sponsorship through [facility] [address], [address] [place]                                    [place], [place] [phone] [place] [address], [place] [phone]    Dr. Adele Schilder  [phone]   [facility] Dept. 1) [address], [place] 2) [place] 3)  [place] [address], [place] [phone]  [phone]   [place] [phone] or [phone] (After Hours)
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
## 2014-10-09 NOTE — Progress Notes (Signed)
*  PRELIMINARY RESULTS* Vascular Ultrasound Right lower extremity venous duplex has been completed.  Preliminary findings: negative for DVT  Landry Mellow, RDMS, RVT  10/09/2014, 3:01 PM

## 2014-10-09 NOTE — ED Notes (Signed)
Pt reports right leg pain for extended amount of time, hx of DVT. Pain is to posterior lower leg and behind her knee, swelling noted to leg.

## 2014-10-09 NOTE — ED Notes (Signed)
Okayd by [place]augh to move to TR01.

## 2014-10-11 NOTE — ED Provider Notes (Signed)
Received a phone call from [facility], PA-C this afternoon regarding a pending follow-up with this patient.   Pt was seen on 10/09/14 for leg swelling with a Hx of DVT.  Venous duplex officially read as negative for DVT, however MD note reports positive.  Pt was d/c home with Xarelto.    2:34 PM  Discussed with Maudie Mercury, Vascular Surgery PA-C who will review the images.    2:46 PM Venous duplex prelim report, images and initial report reviewed by Dr. Bridgett Larsson and Maudie Mercury, PA-C who agree that there is no DVT.   Noelle Redmon, PA-C notified of the findings.  Will Notify Dr. Roxanne Mins so that he may notify the patient prior to her PCP follow-up at 2:30pm tomorrow.    Jarrett Soho Vara Mairena, PA-C 10/11/14 [place], MD 10/23/14 [phone]

## 2014-10-19 ENCOUNTER — Ambulatory Visit: Payer: PRIVATE HEALTH INSURANCE | Admitting: Family Medicine

## 2014-11-16 ENCOUNTER — Encounter (HOSPITAL_COMMUNITY): Payer: Self-pay | Admitting: *Deleted

## 2014-11-16 ENCOUNTER — Emergency Department (INDEPENDENT_AMBULATORY_CARE_PROVIDER_SITE_OTHER)
Admission: EM | Admit: 2014-11-16 | Discharge: 2014-11-16 | Disposition: A | Discharge status: Home / Self Care | Payer: Managed Care, Other (non HMO) | Source: Home / Self Care | Attending: Emergency Medicine | Admitting: Emergency Medicine

## 2014-11-16 DIAGNOSIS — L02413 Cutaneous abscess of right upper limb: Secondary | ICD-10-CM

## 2014-11-16 NOTE — ED Notes (Signed)
Pt  Reports    A  Boil  On her   r  Arm that  Has  Been there  For  About  1    One week          She  Reports  It is  Tender        To  Touch         And  Draining

## 2014-11-16 NOTE — ED Provider Notes (Signed)
CSN: [id number]     Arrival date & time 11/16/14  1513 History   First MD Initiated Contact with Patient 11/16/14 1549     Chief Complaint  Patient presents with  . Abscess   (Consider location/radiation/quality/duration/timing/severity/associated sxs/prior Treatment) HPI Comments: 54 year old female states that she has had a tender pustular lesion to the right forearm for one week. For the past couple days is worse and that is more tender and a little larger. She states that it feels hard and hot. There is currently no drainage but the area over the pustule is crusty as if she has had some previous drainage.   Past Medical History  Diagnosis Date  . SVD (spontaneous vaginal delivery)     x 5  . Headache(784.0)     otc meds prn   . DVT (deep venous thrombosis)    Past Surgical History  Procedure Laterality Date  . Breast surgery      benign turmor removed  . Tubal ligation    . Abdominal hysterectomy  04/06/2012    Procedure: HYSTERECTOMY ABDOMINAL;  Surgeon: Margarette Asal, MD;  Location: [facility];  Service: Gynecology;  Laterality: N/A;  . Salpingoophorectomy  04/06/2012    Procedure: SALPINGO OOPHORECTOMY;  Surgeon: Margarette Asal, MD;  Location: [facility];  Service: Gynecology;  Laterality: Bilateral;   Family History  Problem Relation Age of Onset  . Heart failure Mother   . Hypertension Mother    Social History  Substance Use Topics  . Smoking status: Current Every Day Smoker -- 0.50 packs/day for 38 years  . Smokeless tobacco: Never Used  . Alcohol Use: No     Comment:     OB History    No data available     Review of Systems  Constitutional: Negative.  Negative for fever and activity change.  HENT: Negative.   Respiratory: Negative for shortness of breath.   Cardiovascular: Negative for chest pain.  Gastrointestinal: Negative.   Genitourinary: Negative.   Musculoskeletal: Negative.   Skin: Negative for color change, pallor and rash.  Neurological: Negative.      Allergies  Review of patient's allergies indicates no known allergies.  Home Medications   Prior to Admission medications   Medication Sig Start Date End Date Taking? Authorizing Provider  Rivaroxaban 15 & 20 MG TBPK Take as directed on package: Start with one 15mg  tablet by mouth twice a day with food. On Day 22, switch to one 20mg  tablet once a day with food. 07/09/64   Delora Fuel, MD   Meds Ordered and Administered this Visit  Medications - No data to display  BP 133/88 mmHg  Pulse 71  Temp(Src) 99.4 F (37.4 C) (Oral)  Resp 16  SpO2 95%  LMP 06/13/2011 No data found.   Physical Exam  Constitutional: She is oriented to person, place, and time. She appears well-developed and well-nourished. No distress.  Neck: Normal range of motion. Neck supple.  Cardiovascular: Normal rate.   Pulmonary/Chest: Effort normal. No respiratory distress.  Musculoskeletal: Normal range of motion. She exhibits no edema or tenderness.  Neurological: She is alert and oriented to person, place, and time. She exhibits normal muscle tone.  Skin:  Right forearm on her aspect with a 1 cm raised pustular type lesion with underlying induration approximately 1-1/2 cm in diameter. No erythema. Positive for tenderness.  Psychiatric: She has a normal mood and affect.  Nursing note and vitals reviewed.   ED Course  INCISION AND DRAINAGE Date/Time:  11/16/2014 4:43 PM Performed by: Marcha Dutton, Jeanetta Alonzo Authorized by: Melony Overly Consent: Verbal consent obtained. Risks and benefits: risks, benefits and alternatives were discussed Consent given by: patient Patient understanding: patient states understanding of the procedure being performed Patient identity confirmed: verbally with patient Type: abscess Body area: upper extremity Location details: right arm Anesthesia: local infiltration Local anesthetic: lidocaine 1% with epinephrine Anesthetic total: 3 ml Scalpel size: 11 Incision type: single with  marsupialization Incision depth: subcutaneous Complexity: simple Drainage: purulent Drainage amount: moderate Wound treatment: drain placed Packing material: 1/4 in iodoform gauze Patient tolerance: Patient tolerated the procedure well with no immediate complications   (including critical care time)  Labs Review Labs Reviewed - No data to display  Imaging Review No results found.   Visual Acuity Review  Right Eye Distance:   Left Eye Distance:   Bilateral Distance:    Right Eye Near:   Left Eye Near:    Bilateral Near:         MDM   1. Abscess of forearm, right    This was a small pustule that drained very well with simple I&D and manual expression. She will remove the packing and 2 days. Care for the wound was explained to the patient. She is getting worse, developing redness or swelling or pus draining from the wound she is to return immediately. Wound culture pending   Janne Napoleon, NP 11/16/14 1645

## 2014-11-16 NOTE — Discharge Instructions (Signed)
Abscess °An abscess is an infected area that contains a collection of pus and debris. It can occur in almost any part of the body. An abscess is also known as a furuncle or boil. °CAUSES  °An abscess occurs when tissue gets infected. This can occur from blockage of oil or sweat glands, infection of hair follicles, or a minor injury to the skin. As the body tries to fight the infection, pus collects in the area and creates pressure under the skin. This pressure causes pain. People with weakened immune systems have difficulty fighting infections and get certain abscesses more often.  °SYMPTOMS °Usually an abscess develops on the skin and becomes a painful mass that is red, warm, and tender. If the abscess forms under the skin, you may feel a moveable soft area under the skin. Some abscesses break open (rupture) on their own, but most will continue to get worse without care. The infection can spread deeper into the body and eventually into the bloodstream, causing you to feel ill.  °DIAGNOSIS  °Your caregiver will take your medical history and perform a physical exam. A sample of fluid may also be taken from the abscess to determine what is causing your infection. °TREATMENT  °Your caregiver may prescribe antibiotic medicines to fight the infection. However, taking antibiotics alone usually does not cure an abscess. Your caregiver may need to make a small cut (incision) in the abscess to drain the pus. In some cases, gauze is packed into the abscess to reduce pain and to continue draining the area. °HOME CARE INSTRUCTIONS  °· Only take over-the-counter or prescription medicines for pain, discomfort, or fever as directed by your caregiver. °· If you were prescribed antibiotics, take them as directed. Finish them even if you start to feel better. °· If gauze is used, follow your caregiver's directions for changing the gauze. °· To avoid spreading the infection: °· Keep your draining abscess covered with a  bandage. °· Wash your hands well. °· Do not share personal care items, towels, or whirlpools with others. °· Avoid skin contact with others. °· Keep your skin and clothes clean around the abscess. °· Keep all follow-up appointments as directed by your caregiver. °SEEK MEDICAL CARE IF:  °· You have increased pain, swelling, redness, fluid drainage, or bleeding. °· You have muscle aches, chills, or a general ill feeling. °· You have a fever. °MAKE SURE YOU:  °· Understand these instructions. °· Will watch your condition. °· Will get help right away if you are not doing well or get worse. °Document Released: 12/03/2004 Document Revised: 08/25/2011 Document Reviewed: 05/08/2011 °ExitCare® Patient Information ©2015 [employer]. This information is not intended to replace advice given to you by your health care provider. Make sure you discuss any questions you have with your health care provider. ° °Abscess °Care After °An abscess (also called a boil or furuncle) is an infected area that contains a collection of pus. Signs and symptoms of an abscess include pain, tenderness, redness, or hardness, or you may feel a moveable soft area under your skin. An abscess can occur anywhere in the body. The infection may spread to surrounding tissues causing cellulitis. A cut (incision) by the surgeon was made over your abscess and the pus was drained out. Gauze may have been packed into the space to provide a drain that will allow the cavity to heal from the inside outwards. The boil may be painful for 5 to 7 days. Most people with a boil do not have   high fevers. Your abscess, if seen early, may not have localized, and may not have been lanced. If not, another appointment may be required for this if it does not get better on its own or with medications. °HOME CARE INSTRUCTIONS  °· Only take over-the-counter or prescription medicines for pain, discomfort, or fever as directed by your caregiver. °· When you bathe, soak and then  remove gauze or iodoform packs at least daily or as directed by your caregiver. You may then wash the wound gently with mild soapy water. Repack with gauze or do as your caregiver directs. °SEEK IMMEDIATE MEDICAL CARE IF:  °· You develop increased pain, swelling, redness, drainage, or bleeding in the wound site. °· You develop signs of generalized infection including muscle aches, chills, fever, or a general ill feeling. °· An oral temperature above 102° F (38.9° C) develops, not controlled by medication. °See your caregiver for a recheck if you develop any of the symptoms described above. If medications (antibiotics) were prescribed, take them as directed. °Document Released: 09/11/2004 Document Revised: 05/18/2011 Document Reviewed: 05/09/2007 °ExitCare® Patient Information ©2015 [employer]. This information is not intended to replace advice given to you by your health care provider. Make sure you discuss any questions you have with your health care provider. ° °

## 2014-11-19 LAB — CULTURE, ROUTINE-ABSCESS: Special Requests: NORMAL

## 2014-11-20 NOTE — ED Notes (Signed)
Attempted to call patient to check on condition. Recording on her home number for contact indicated the number has been disconnected

## 2014-12-02 ENCOUNTER — Emergency Department (HOSPITAL_COMMUNITY)
Admission: EM | Admit: 2014-12-02 | Discharge: 2014-12-02 | Disposition: A | Discharge status: Home / Self Care | Payer: Managed Care, Other (non HMO) | Attending: Emergency Medicine | Admitting: Emergency Medicine

## 2014-12-02 ENCOUNTER — Encounter (HOSPITAL_COMMUNITY): Payer: Self-pay | Admitting: Emergency Medicine

## 2014-12-02 DIAGNOSIS — Z86718 Personal history of other venous thrombosis and embolism: Secondary | ICD-10-CM | POA: Insufficient documentation

## 2014-12-02 DIAGNOSIS — M7918 Myalgia, other site: Secondary | ICD-10-CM

## 2014-12-02 DIAGNOSIS — Y9389 Activity, other specified: Secondary | ICD-10-CM | POA: Diagnosis not present

## 2014-12-02 DIAGNOSIS — Y92410 Unspecified street and highway as the place of occurrence of the external cause: Secondary | ICD-10-CM | POA: Diagnosis not present

## 2014-12-02 DIAGNOSIS — S4991XA Unspecified injury of right shoulder and upper arm, initial encounter: Secondary | ICD-10-CM | POA: Diagnosis not present

## 2014-12-02 DIAGNOSIS — S3992XA Unspecified injury of lower back, initial encounter: Secondary | ICD-10-CM | POA: Insufficient documentation

## 2014-12-02 DIAGNOSIS — Z720 Tobacco use: Secondary | ICD-10-CM | POA: Diagnosis not present

## 2014-12-02 DIAGNOSIS — Y998 Other external cause status: Secondary | ICD-10-CM | POA: Insufficient documentation

## 2014-12-02 DIAGNOSIS — S4992XA Unspecified injury of left shoulder and upper arm, initial encounter: Secondary | ICD-10-CM | POA: Diagnosis not present

## 2014-12-02 MED ORDER — METHOCARBAMOL 500 MG PO TABS: 500.0000 mg | ORAL_TABLET | Freq: Two times a day (BID) | ORAL | Status: DC

## 2014-12-02 MED ORDER — KETOROLAC TROMETHAMINE 60 MG/2ML IM SOLN
60.0000 mg | Freq: Once | INTRAMUSCULAR | Status: AC
  Administered 2014-12-02: 60 mg via INTRAMUSCULAR
  Filled 2014-12-02: qty 2

## 2014-12-02 MED ORDER — NAPROXEN 500 MG PO TABS: 500.0000 mg | ORAL_TABLET | Freq: Two times a day (BID) | ORAL | Status: DC
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
## 2014-12-02 NOTE — ED Notes (Signed)
Declined W/C at D/C and was escorted to lobby by RN. 

## 2014-12-28 ENCOUNTER — Encounter (HOSPITAL_COMMUNITY): Payer: Self-pay | Admitting: Emergency Medicine

## 2014-12-28 ENCOUNTER — Emergency Department (HOSPITAL_COMMUNITY)
Admission: EM | Admit: 2014-12-28 | Discharge: 2014-12-28 | Disposition: A | Discharge status: Home / Self Care | Payer: Managed Care, Other (non HMO) | Attending: Emergency Medicine | Admitting: Emergency Medicine

## 2014-12-28 DIAGNOSIS — Z791 Long term (current) use of non-steroidal anti-inflammatories (NSAID): Secondary | ICD-10-CM | POA: Insufficient documentation

## 2014-12-28 DIAGNOSIS — Z720 Tobacco use: Secondary | ICD-10-CM | POA: Insufficient documentation

## 2014-12-28 DIAGNOSIS — L02415 Cutaneous abscess of right lower limb: Secondary | ICD-10-CM

## 2014-12-28 DIAGNOSIS — Z86718 Personal history of other venous thrombosis and embolism: Secondary | ICD-10-CM | POA: Insufficient documentation

## 2014-12-28 DIAGNOSIS — Z79899 Other long term (current) drug therapy: Secondary | ICD-10-CM | POA: Insufficient documentation

## 2014-12-28 DIAGNOSIS — Z7902 Long term (current) use of antithrombotics/antiplatelets: Secondary | ICD-10-CM | POA: Diagnosis not present

## 2014-12-28 MED ORDER — SULFAMETHOXAZOLE-TRIMETHOPRIM 800-160 MG PO TABS: 2.0000 | ORAL_TABLET | Freq: Two times a day (BID) | ORAL | Status: AC

## 2014-12-28 MED ORDER — HYDROCODONE-ACETAMINOPHEN 5-325 MG PO TABS: 1.0000 | ORAL_TABLET | Freq: Four times a day (QID) | ORAL | Status: DC | PRN

## 2014-12-28 MED ORDER — OXYCODONE-ACETAMINOPHEN 5-325 MG PO TABS
1.0000 | ORAL_TABLET | Freq: Once | ORAL | Status: AC
  Administered 2014-12-28: 1 via ORAL
  Filled 2014-12-28: qty 1

## 2014-12-28 NOTE — ED Notes (Signed)
Pt states that she has had abscess to back of let near buttocks.  Pt states that she noticed today that it was open and has a deep hole.

## 2014-12-28 NOTE — ED Provider Notes (Signed)
CSN: [id number]     Arrival date & time 12/28/14  1710 History  By signing my name below, I, Evelene Croon, attest that this documentation has been prepared under the direction and in the presence of non-physician practitioner, Irena Cords, PA-C. Electronically Signed: Evelene Croon, Scribe. 12/28/2014. 5:48 PM.  Chief Complaint  Patient presents with  . Abscess   The history is provided by the patient. No language interpreter was used.   HPI Comments:  [name] is a 54 y.o. female who presents to the Emergency Department complaining of a boil to her posterior thigh which she noticed 1 week ago. She reports moderate constant pain and drainage from the site which has opened on its own.  Pt also reports boils under her left axilla. No alleviating factors noted; no fever.  Past Medical History  Diagnosis Date  . SVD (spontaneous vaginal delivery)     x 5  . Headache(784.0)     otc meds prn   . DVT (deep venous thrombosis) [facility])    Past Surgical History  Procedure Laterality Date  . Breast surgery      benign turmor removed  . Tubal ligation    . Abdominal hysterectomy  04/06/2012    Procedure: HYSTERECTOMY ABDOMINAL;  Surgeon: Margarette Asal, MD;  Location: [facility];  Service: Gynecology;  Laterality: N/A;  . Salpingoophorectomy  04/06/2012    Procedure: SALPINGO OOPHORECTOMY;  Surgeon: Margarette Asal, MD;  Location: [facility];  Service: Gynecology;  Laterality: Bilateral;   Family History  Problem Relation Age of Onset  . Heart failure Mother   . Hypertension Mother    Social History  Substance Use Topics  . Smoking status: Current Every Day Smoker -- 0.50 packs/day for 38 years  . Smokeless tobacco: Never Used  . Alcohol Use: No     Comment:     OB History    No data available     Review of Systems All other systems negative except as documented in the HPI. All pertinent positives and negatives as reviewed in the HPI.  Allergies  Review of patient's allergies  indicates no known allergies.  Home Medications   Prior to Admission medications   Medication Sig Start Date End Date Taking? Authorizing Provider  methocarbamol (ROBAXIN) 500 MG tablet Take 1 tablet (500 mg total) by mouth 2 (two) times daily. 12/02/14   Hanna Patel-Mills, PA-C  naproxen (NAPROSYN) 500 MG tablet Take 1 tablet (500 mg total) by mouth 2 (two) times daily. 12/02/14   Hanna Patel-Mills, PA-C  Rivaroxaban 15 & 20 MG TBPK Take as directed on package: Start with one 15mg  tablet by mouth twice a day with food. On Day 22, switch to one 20mg  tablet once a day with food. 06/15/65   Delora Fuel, MD   BP 591/63 mmHg  Pulse 75  Temp(Src) 98.6 F (37 C) (Oral)  Resp 20  SpO2 98%  LMP 06/13/2011 Physical Exam  Constitutional: She is oriented to person, place, and time. She appears well-developed and well-nourished.  HENT:  Head: Normocephalic and atraumatic.  Eyes: Conjunctivae are normal.  Cardiovascular: Normal rate.   Pulmonary/Chest: Effort normal.  Neurological: She is alert and oriented to person, place, and time.  Skin: Skin is warm and dry.  Open abscess noted to posterior thigh inferior to right buttocks that has minimal drainage at this time with surrounding induration and increased warmth.  3 small pustular areas to left axilla.  Psychiatric: She has a normal mood  and affect.  Nursing note and vitals reviewed.   ED Course  Procedures   DIAGNOSTIC STUDIES:  Oxygen Saturation is 98% on RA, normal by my interpretation.    COORDINATION OF CARE:  5:47PM Discussed treatment plan with pt at bedside and pt agreed to plan.  The area has drained and is open at this time a she is advised to return here as needed.  Also advised her to use warm soaks.  Told to use warm compresses around the area as well.  Keep the area clean and dry.   Dalia Heading, PA-C 12/28/14 1807  Orlie Dakin, MD 12/28/14 2326

## 2014-12-28 NOTE — Discharge Instructions (Signed)
Return here as needed.  Use warm soaks.  Keep area clean and dry, use warm compresses around the area

## 2015-09-02 ENCOUNTER — Encounter (HOSPITAL_COMMUNITY): Payer: Self-pay

## 2015-09-02 ENCOUNTER — Emergency Department (HOSPITAL_COMMUNITY)
Admission: EM | Admit: 2015-09-02 | Discharge: 2015-09-02 | Disposition: A | Discharge status: Home / Self Care | Payer: Self-pay | Attending: Emergency Medicine | Admitting: Emergency Medicine

## 2015-09-02 ENCOUNTER — Emergency Department (HOSPITAL_BASED_OUTPATIENT_CLINIC_OR_DEPARTMENT_OTHER)
Admit: 2015-09-02 | Discharge: 2015-09-02 | Disposition: A | Discharge status: Home / Self Care | Payer: Self-pay | Attending: Emergency Medicine | Admitting: Emergency Medicine

## 2015-09-02 ENCOUNTER — Other Ambulatory Visit: Payer: Self-pay

## 2015-09-02 DIAGNOSIS — Z79899 Other long term (current) drug therapy: Secondary | ICD-10-CM | POA: Insufficient documentation

## 2015-09-02 DIAGNOSIS — R0789 Other chest pain: Secondary | ICD-10-CM | POA: Insufficient documentation

## 2015-09-02 DIAGNOSIS — F17200 Nicotine dependence, unspecified, uncomplicated: Secondary | ICD-10-CM | POA: Insufficient documentation

## 2015-09-02 DIAGNOSIS — Z7901 Long term (current) use of anticoagulants: Secondary | ICD-10-CM | POA: Insufficient documentation

## 2015-09-02 DIAGNOSIS — M79601 Pain in right arm: Secondary | ICD-10-CM | POA: Insufficient documentation

## 2015-09-02 DIAGNOSIS — M79609 Pain in unspecified limb: Secondary | ICD-10-CM

## 2015-09-02 LAB — CBC WITH DIFFERENTIAL/PLATELET
Basophils Absolute: 0 10*3/uL (ref 0.0–0.1)
Basophils Relative: 0 %
Eosinophils Absolute: 0.1 10*3/uL (ref 0.0–0.7)
Eosinophils Relative: 3 %
HCT: 42.3 % (ref 36.0–46.0)
Hemoglobin: 13.3 g/dL (ref 12.0–15.0)
Lymphocytes Relative: 55 %
Lymphs Abs: 2.1 10*3/uL (ref 0.7–4.0)
MCH: 29 pg (ref 26.0–34.0)
MCHC: 31.4 g/dL (ref 30.0–36.0)
MCV: 92.2 fL (ref 78.0–100.0)
Monocytes Absolute: 0.2 10*3/uL (ref 0.1–1.0)
Monocytes Relative: 6 %
Neutro Abs: 1.4 10*3/uL — ABNORMAL LOW (ref 1.7–7.7)
Neutrophils Relative %: 36 %
Platelets: 200 10*3/uL (ref 150–400)
RBC: 4.59 MIL/uL (ref 3.87–5.11)
RDW: 14.1 % (ref 11.5–15.5)
WBC: 3.8 10*3/uL — ABNORMAL LOW (ref 4.0–10.5)

## 2015-09-02 LAB — I-STAT CHEM 8, ED
BUN: 20 mg/dL (ref 6–20)
Calcium, Ion: 1.14 mmol/L (ref 1.12–1.23)
Chloride: 109 mmol/L (ref 101–111)
Creatinine, Ser: 0.8 mg/dL (ref 0.44–1.00)
Glucose, Bld: 83 mg/dL (ref 65–99)
HCT: 42 % (ref 36.0–46.0)
Hemoglobin: 14.3 g/dL (ref 12.0–15.0)
Potassium: 4.4 mmol/L (ref 3.5–5.1)
Sodium: 142 mmol/L (ref 135–145)
TCO2: 18 mmol/L (ref 0–100)

## 2015-09-02 LAB — I-STAT TROPONIN, ED
Troponin i, poc: 0 ng/mL (ref 0.00–0.08)
Troponin i, poc: 0 ng/mL (ref 0.00–0.08)

## 2015-09-02 LAB — COMPREHENSIVE METABOLIC PANEL
ALT: 15 U/L (ref 14–54)
AST: 15 U/L (ref 15–41)
Albumin: 3 g/dL — ABNORMAL LOW (ref 3.5–5.0)
Alkaline Phosphatase: 43 U/L (ref 38–126)
Anion gap: 6 (ref 5–15)
BUN: 18 mg/dL (ref 6–20)
CO2: 23 mmol/L (ref 22–32)
Calcium: 8.9 mg/dL (ref 8.9–10.3)
Chloride: 113 mmol/L — ABNORMAL HIGH (ref 101–111)
Creatinine, Ser: 0.8 mg/dL (ref 0.44–1.00)
GFR calc Af Amer: >60 mL/min (ref >60)
GFR calc non Af Amer: >60 mL/min (ref >60)
Glucose, Bld: 78 mg/dL (ref 65–99)
Potassium: 4.5 mmol/L (ref 3.5–5.1)
Sodium: 142 mmol/L (ref 135–145)
Total Bilirubin: 0.4 mg/dL (ref 0.3–1.2)
Total Protein: 5.1 g/dL — ABNORMAL LOW (ref 6.5–8.1)

## 2015-09-02 MED ORDER — HYDROCODONE-ACETAMINOPHEN 5-325 MG PO TABS
1.0000 | ORAL_TABLET | Freq: Once | ORAL | Status: AC
  Administered 2015-09-02: 1 via ORAL
  Filled 2015-09-02: qty 1

## 2015-09-02 NOTE — ED Notes (Signed)
Delta troponin drawn.

## 2015-09-02 NOTE — ED Provider Notes (Signed)
Complains of right arm pain onset 4 days ago. Pain is constant worse made worse by having her right arm down at her side improved with raising her arm above her head. She also complained of right anterior chest pain this morning lasting 30 minutes, resolve spontaneously. No other associated symptoms. No shortness of breath. Treated by EMS with aspirin. On exam alert no distress lungs clear auscultation heart regular rate and rhythm no murmurs right upper extremity without redness or swelling. Radial pulses 2+ when her shoulder is abducted or when patient is in anatomic position with right arm supine.  Orlie Dakin, MD 09/02/15 1157

## 2015-09-02 NOTE — ED Provider Notes (Signed)
CSN: [id number]     Arrival date & time 09/02/15  [id number] History   First MD Initiated Contact with Patient 09/02/15 1021     Chief Complaint  Patient presents with  . Arm Pain  . Chest Pain     (Consider location/radiation/quality/duration/timing/severity/associated sxs/prior Treatment) HPI [name] is a 55 y.o. 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 Patient reports pain has been gradually worsening over the past one week, located in her right upper arm. She describes it as a dull, burning ache. She reports holding her arm elevated and sleeping on her arm relieves the discomfort, but letting her arm hang by her side worsens discomfort. She denies fevers, chills, chest pain, shortness of breath, cough, hemoptysis or arm swelling. She has tried aspirin without relief of her symptoms. No other modifying factors.  Past Medical History  Diagnosis Date  . SVD (spontaneous vaginal delivery)     x 5  . Headache(784.0)     otc meds prn   . DVT (deep venous thrombosis) [facility])    Past Surgical History  Procedure Laterality Date  . Breast surgery      benign turmor removed  . Tubal ligation    . Abdominal hysterectomy  04/06/2012    Procedure: HYSTERECTOMY ABDOMINAL;  Surgeon: Margarette Asal, MD;  Location: [facility];  Service: Gynecology;  Laterality: N/A;  . Salpingoophorectomy  04/06/2012    Procedure: SALPINGO OOPHORECTOMY;  Surgeon: Margarette Asal, MD;  Location: [facility];  Service: Gynecology;  Laterality: Bilateral;   Family History  Problem Relation Age of Onset  . Heart failure Mother   . Hypertension Mother    Social History  Substance Use Topics  . Smoking status: Current Every Day Smoker -- 0.50 packs/day for 38 years  . Smokeless tobacco: Never Used  . Alcohol Use: No     Comment:     OB History    Gravida Para Term Preterm AB TAB SAB Ectopic Multiple Living   7 5             Review of Systems A 10  point review of systems was completed and was negative except for pertinent positives and negatives as mentioned in the history of present illness     Allergies  Review of patient's allergies indicates no known allergies.  Home Medications   Prior to Admission medications   Medication Sig Start Date End Date Taking? Authorizing Provider  naproxen (NAPROSYN) 500 MG tablet Take 1 tablet (500 mg total) by mouth 2 (two) times daily. Patient taking differently: Take 500 mg by mouth 2 (two) times daily as needed for mild pain.  12/02/14  Yes Hanna Patel-Mills, PA-C  HYDROcodone-acetaminophen (NORCO/VICODIN) 5-325 MG tablet Take 1 tablet by mouth every 6 (six) hours as needed for moderate pain. Patient not taking: Reported on 09/02/2015 12/28/14   Dalia Heading, PA-C  methocarbamol (ROBAXIN) 500 MG tablet Take 1 tablet (500 mg total) by mouth 2 (two) times daily. Patient not taking: Reported on 09/02/2015 12/02/14   Ottie Glazier, PA-C  Rivaroxaban 15 & 20 MG TBPK Take as directed on package: Start with one 15mg  tablet by mouth twice a day with food. On Day 22, switch to one 20mg  tablet once a day with food. Patient not taking: Reported on 09/02/2015 [id number]   Delora Fuel, MD   BP 123456 mmHg  Pulse 50  Temp(Src) 97.7 F (36.5 C) (Oral)  Resp 12  Ht 5\' 4"  (1.626 m)  Wt 76.658 kg  BMI 28.99 kg/m2  SpO2 97%  LMP 06/13/2011 Physical Exam  Constitutional: She is oriented to person, place, and time. She appears well-developed and well-nourished.  Appears uncomfortable. Alert and oriented, GCS 15.  HENT:  Head: Normocephalic and atraumatic.  Mouth/Throat: Oropharynx is clear and moist.  Eyes: Conjunctivae are normal. Pupils are equal, round, and reactive to light. Right eye exhibits no discharge. Left eye exhibits no discharge. No scleral icterus.  Neck: Normal range of motion. Neck supple.  Mild tenderness to right trapezius and paraspinal cervical musculature with no bony tenderness.   Cardiovascular: Normal rate, regular rhythm and normal heart sounds.   Pulmonary/Chest: Effort normal and breath sounds normal. No respiratory distress. She has no wheezes. She has no rales.  Abdominal: Soft. There is no tenderness.  Musculoskeletal: She exhibits no tenderness.  Full active range of motion of right arm. No appreciable unilateral swelling, erythema. Tenderness to palpation over medial aspect of right upper extremity between biceps and triceps. No skin changes. Distal pulses intact with brisk cap refill.  Neurological: She is alert and oriented to person, place, and time.  Cranial Nerves II-XII grossly intact. Motor strength 5/5 in all 4 extremities. Sensation intact to light touch. Grip strength intact and equal bilaterally.  Skin: Skin is warm and dry. No rash noted.  Psychiatric: She has a normal mood and affect.  Nursing note and vitals reviewed.   ED Course  Procedures (including critical care time) Labs Review Labs Reviewed  CBC WITH DIFFERENTIAL/PLATELET - Abnormal; Notable for the following:    WBC 3.8 (*)    Neutro Abs 1.4 (*)    All other components within normal limits  I-STAT CHEM 8, ED - Abnormal; Notable for the following:    Potassium 7.7 (*)    Chloride 112 (*)    BUN 32 (*)    Calcium, Ion 1.02 (*)    All other components within normal limits  I-STAT TROPOININ, ED  I-STAT CHEM 8, ED  I-STAT TROPOININ, ED    Imaging Review No results found. I have personally reviewed and evaluated these images and lab results as part of my medical decision-making.   EKG Interpretation   Date/Time:  Monday September 02 2015 10:30:01 EDT Ventricular Rate:  59 PR Interval:    QRS Duration: 85 QT Interval:  462 QTC Calculation: 458 R Axis:   73 Text Interpretation:  Sinus rhythm Probable left atrial enlargement  Minimal ST elevation, inferior leads No old tracing to compare Confirmed  by Winfred Leeds  MD, SAM [phone]) on 09/02/2015 11:56:12 AM Also confirmed by   Winfred Leeds  MD,  [phone]), editor [place], Leda Gauze [phone])  on  09/02/2015 12:45:52 PM      MDM  Patient presents with five-day history of right upper extremity arm pain similar previous DVT pain. She is hemodynamically stable, afebrile, no cardiopulmonary complaints. Physical exam is grossly unremarkable, however given patient's history, we'll obtain venous Doppler ultrasound of right upper extremity to rule out DVT. Case management consult at for PCP follow-up, medication needs. Ultrasound negative for DVT, delta troponin negative. Labs are otherwise unremarkable. The discomfort has positional component, possibly secondary to musculoskeletal etiology. Encouraged continued OTC therapy at home and follow-up with PCP for reevaluation. Strict return precautions discussed. Final diagnoses:  Right arm pain        Comer Locket, PA-C 09/02/15 1731  Orlie Dakin, MD 09/03/15 1551

## 2015-09-02 NOTE — ED Notes (Signed)
Pt oob to BR with steady gait. 

## 2015-09-02 NOTE — Progress Notes (Signed)
Preliminary results by tech - Venous Duplex Right Upper Arm Completed. Negative for deep and superficial vein thrombosis. Oda Cogan, BS, RDMS, RVT

## 2015-09-02 NOTE — ED Notes (Signed)
Vascular at bedside

## 2015-09-02 NOTE — Discharge Instructions (Signed)
There is not appear to be an emergent cause for your discomfort at this time. Her ultrasound was negative for any blood clots. Your exam and blood work is reassuring. Please follow-up with your doctor for reevaluation. Continue taking Tylenol and Motrin for discomfort. Return to ED for any new or worsening symptoms.  Musculoskeletal Pain Musculoskeletal pain is muscle and boney aches and pains. These pains can occur in any part of the body. Your caregiver may treat you without knowing the cause of the pain. They may treat you if blood or urine tests, X-rays, and other tests were normal.  CAUSES There is often not a definite cause or reason for these pains. These pains may be caused by a type of germ (virus). The discomfort may also come from overuse. Overuse includes working out too hard when your body is not fit. Boney aches also come from weather changes. Bone is sensitive to atmospheric pressure changes. HOME CARE INSTRUCTIONS   Ask when your test results will be ready. Make sure you get your test results.  Only take over-the-counter or prescription medicines for pain, discomfort, or fever as directed by your caregiver. If you were given medications for your condition, do not drive, operate machinery or power tools, or sign legal documents for 24 hours. Do not drink alcohol. Do not take sleeping pills or other medications that may interfere with treatment.  Continue all activities unless the activities cause more pain. When the pain lessens, slowly resume normal activities. Gradually increase the intensity and duration of the activities or exercise.  During periods of severe pain, bed rest may be helpful. Lay or sit in any position that is comfortable.  Putting ice on the injured area.  Put ice in a bag.  Place a towel between your skin and the bag.  Leave the ice on for 15 to 20 minutes, 3 to 4 times a day.  Follow up with your caregiver for continued problems and no reason can be found  for the pain. If the pain becomes worse or does not go away, it may be necessary to repeat tests or do additional testing. Your caregiver may need to look further for a possible cause. SEEK IMMEDIATE MEDICAL CARE IF:  You have pain that is getting worse and is not relieved by medications.  You develop chest pain that is associated with shortness or breath, sweating, feeling sick to your stomach (nauseous), or throw up (vomit).  Your pain becomes localized to the abdomen.  You develop any new symptoms that seem different or that concern you. MAKE SURE YOU:   Understand these instructions.  Will watch your condition.  Will get help right away if you are not doing well or get worse.   This information is not intended to replace advice given to you by your health care provider. Make sure you discuss any questions you have with your health care provider.   Document Released: 02/23/2005 Document Revised: 05/18/2011 Document Reviewed: 10/28/2012 Elsevier Interactive Patient Education [employer].

## 2015-09-02 NOTE — ED Notes (Signed)
Pt arrives EMS with c/o r arm pain x 1 week with episode of chest pain today lasting 2 minutes today. 324 Aspirin by EMS.

## 2015-09-03 LAB — I-STAT CHEM 8, ED
BUN: 32 mg/dL — ABNORMAL HIGH (ref 6–20)
Calcium, Ion: 1.02 mmol/L — ABNORMAL LOW (ref 1.12–1.23)
Chloride: 112 mmol/L — ABNORMAL HIGH (ref 101–111)
Creatinine, Ser: 0.7 mg/dL (ref 0.44–1.00)
Glucose, Bld: 69 mg/dL (ref 65–99)
HCT: 43 % (ref 36.0–46.0)
Hemoglobin: 14.6 g/dL (ref 12.0–15.0)
Potassium: 7.7 mmol/L (ref 3.5–5.1)
Sodium: 139 mmol/L (ref 135–145)
TCO2: 23 mmol/L (ref 0–100)

## 2015-09-06 ENCOUNTER — Ambulatory Visit: Payer: Managed Care, Other (non HMO)

## 2015-09-09 ENCOUNTER — Ambulatory Visit: Payer: Managed Care, Other (non HMO) | Admitting: Family Medicine

## 2015-09-13 ENCOUNTER — Ambulatory Visit: Payer: Managed Care, Other (non HMO) | Attending: Internal Medicine

## 2015-09-13 ENCOUNTER — Emergency Department (HOSPITAL_COMMUNITY)
Admission: EM | Admit: 2015-09-13 | Discharge: 2015-09-13 | Disposition: A | Discharge status: Home / Self Care | Payer: Managed Care, Other (non HMO) | Attending: Emergency Medicine | Admitting: Emergency Medicine

## 2015-09-13 ENCOUNTER — Encounter (HOSPITAL_COMMUNITY): Payer: Self-pay | Admitting: Emergency Medicine

## 2015-09-13 DIAGNOSIS — Z79899 Other long term (current) drug therapy: Secondary | ICD-10-CM | POA: Insufficient documentation

## 2015-09-13 DIAGNOSIS — F17200 Nicotine dependence, unspecified, uncomplicated: Secondary | ICD-10-CM | POA: Insufficient documentation

## 2015-09-13 DIAGNOSIS — G5601 Carpal tunnel syndrome, right upper limb: Secondary | ICD-10-CM

## 2015-09-13 DIAGNOSIS — Z86718 Personal history of other venous thrombosis and embolism: Secondary | ICD-10-CM | POA: Insufficient documentation

## 2015-09-13 MED ORDER — TRAMADOL HCL 50 MG PO TABS: 50.0000 mg | ORAL_TABLET | Freq: Four times a day (QID) | ORAL | Status: DC | PRN

## 2015-09-13 MED ORDER — NAPROXEN 500 MG PO TABS: 500.0000 mg | ORAL_TABLET | Freq: Two times a day (BID) | ORAL | Status: DC

## 2015-09-13 NOTE — Discharge Instructions (Signed)

## 2015-09-13 NOTE — ED Notes (Signed)
Pt ambulatory and independent at discharge.  

## 2015-09-13 NOTE — ED Provider Notes (Signed)
CSN: [id number]     Arrival date & time 09/13/15  1805 History  By signing my name below, I, Rayna Sexton, attest that this documentation has been prepared under the direction and in the presence of Margarita Mail, PA-C. Electronically Signed: Rayna Sexton, ED Scribe. 09/13/2015. 8:13 PM.   Chief Complaint  Patient presents with  . Arm Pain   The history is provided by the patient. No language interpreter was used.    HPI Comments: [name] is a 55 y.o. female with a hx of DVT 8/16- not currently on anticoagulation who presents to the Emergency Department complaining of intermittent, moderate, right arm pain onset 3 weeks ago and worsened to become constant pain a few days ago. Pt states she woke today with worsening pain with associated, moderate, paraesthesias across her posterior RUE and mild numbness to the fingers of her right hand. Pt was seen at [facility] for similar symptoms on 09/02/2015 and had a work up for stroke and DVT with all results being negative. She has taken ibuprofen without significant relief. Her pain worsens with RUE movement. 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 She denies any other associated symptoms at this time.   Past Medical History  Diagnosis Date  . SVD (spontaneous vaginal delivery)     x 5  . Headache(784.0)     otc meds prn   . DVT (deep venous thrombosis) [facility])    Past Surgical History  Procedure Laterality Date  . Breast surgery      benign turmor removed  . Tubal ligation    . Abdominal hysterectomy  04/06/2012    Procedure: HYSTERECTOMY ABDOMINAL;  Surgeon: Margarette Asal, MD;  Location: [facility];  Service: Gynecology;  Laterality: N/A;  . Salpingoophorectomy  04/06/2012    Procedure: SALPINGO OOPHORECTOMY;  Surgeon: Margarette Asal, MD;  Location: [place];  Service: Gynecology;  Laterality: Bilateral;   Family History  Problem Relation Age of Onset  . Heart failure Mother   . Hypertension Mother    Social  History  Substance Use Topics  . Smoking status: Current Every Day Smoker -- 0.50 packs/day for 38 years  . Smokeless tobacco: Never Used  . Alcohol Use: No     Comment:     OB History    Gravida Para Term Preterm AB TAB SAB Ectopic Multiple Living   7 5             Review of Systems  Musculoskeletal: Positive for myalgias and arthralgias. Negative for joint swelling.  Skin: Negative for color change and wound.  Neurological: Positive for numbness.    Allergies  Review of patient's allergies indicates no known allergies.  Home Medications   Prior to Admission medications   Medication Sig Start Date End Date Taking? Authorizing Provider  HYDROcodone-acetaminophen (NORCO/VICODIN) 5-325 MG tablet Take 1 tablet by mouth every 6 (six) hours as needed for moderate pain. Patient not taking: Reported on 09/02/2015 12/28/14   Dalia Heading, PA-C  methocarbamol (ROBAXIN) 500 MG tablet Take 1 tablet (500 mg total) by mouth 2 (two) times daily. Patient not taking: Reported on 09/02/2015 12/02/14   Ottie Glazier, PA-C  naproxen (NAPROSYN) 500 MG tablet Take 1 tablet (500 mg total) by mouth 2 (two) times daily. Patient taking differently: Take 500 mg by mouth 2 (two) times daily as needed for mild pain.  12/02/14   Ottie Glazier, PA-C  Rivaroxaban 15 & 20 MG TBPK Take as  directed on package: Start with one 15mg  tablet by mouth twice a day with food. On Day 22, switch to one 20mg  tablet once a day with food. Patient not taking: Reported on 09/02/2015 [id number]   Delora Fuel, MD   BP 99991111 mmHg  Pulse 78  Temp(Src) 98.4 F (36.9 C) (Oral)  Resp 16  SpO2 96%  LMP 06/13/2011    Physical Exam  Constitutional: She is oriented to person, place, and time. She appears well-developed and well-nourished.  HENT:  Head: Normocephalic and atraumatic.  Eyes: EOM are normal.  Neck: Normal range of motion.  Cardiovascular: Normal rate.   Pulmonary/Chest: Effort normal. No respiratory  distress.  Abdominal: Soft.  Musculoskeletal: Normal range of motion. She exhibits edema.  Positive Tinel's and Phalen's test; mild edema noted to right wrist when compared to the left; numbness to all five digits of the right hand  Neurological: She is alert and oriented to person, place, and time.  Skin: Skin is warm and dry.  Psychiatric: She has a normal mood and affect.  Nursing note and vitals reviewed.   ED Course  Procedures  DIAGNOSTIC STUDIES: Oxygen Saturation is 96% on RA, normal by my interpretation.    COORDINATION OF CARE: 8:08 PM Discussed next steps with pt. Pt verbalized understanding and is agreeable with the plan.   Labs Review Labs Reviewed - No data to display  Imaging Review No results found.   EKG Interpretation None      MDM   Final diagnoses:  None     Symptoms consistent with carpal tunnel. Advised pt to have TSH level check by PCP. Pt given removable wrist splint. Will treat with tramadol/ naproxen. No concern for radiculopathy. Cryotherapy, supportive therapy discussed. Follow up with Hand. Return precautions discussed. Pt is safe for discharge at this time.    I personally performed the services described in this documentation, which was scribed in my presence. The recorded information has been reviewed and is accurate.      Margarita Mail, PA-C 09/13/15 2021  Daleen Bo, MD 09/14/15 1116

## 2015-09-13 NOTE — ED Notes (Signed)
Pt c/o right arm pain and finger tingling. Pt went to [facility] and had a work up for a stroke and blood clot and everything came back negative. Pt states she was told to take some Ibuprofen, but the pain has continued.

## 2015-09-16 MED FILL — NAPROXEN 500 MG TABLET: 500 | 15 days supply | Qty: 30 | Fill #0

## 2015-09-16 MED FILL — traMADol HCL 50 MG TABS: 50 | 3 days supply | Qty: 15 | Fill #0

## 2015-10-21 ENCOUNTER — Ambulatory Visit: Payer: Managed Care, Other (non HMO) | Admitting: Family Medicine

## 2015-12-03 ENCOUNTER — Ambulatory Visit (INDEPENDENT_AMBULATORY_CARE_PROVIDER_SITE_OTHER): Payer: Managed Care, Other (non HMO) | Admitting: Family Medicine

## 2015-12-03 ENCOUNTER — Encounter: Payer: Self-pay | Admitting: Family Medicine

## 2015-12-03 VITALS — BP 139/76 | HR 80 | Temp 98.8°F | Resp 18 | Ht 66.0 in | Wt 166.0 lb

## 2015-12-03 DIAGNOSIS — Z136 Encounter for screening for cardiovascular disorders: Secondary | ICD-10-CM

## 2015-12-03 DIAGNOSIS — M549 Dorsalgia, unspecified: Secondary | ICD-10-CM

## 2015-12-03 DIAGNOSIS — Z0000 Encounter for general adult medical examination without abnormal findings: Secondary | ICD-10-CM

## 2015-12-03 DIAGNOSIS — Z131 Encounter for screening for diabetes mellitus: Secondary | ICD-10-CM

## 2015-12-03 DIAGNOSIS — Z13220 Encounter for screening for lipoid disorders: Secondary | ICD-10-CM

## 2015-12-03 DIAGNOSIS — Z1159 Encounter for screening for other viral diseases: Secondary | ICD-10-CM

## 2015-12-03 DIAGNOSIS — Z1239 Encounter for other screening for malignant neoplasm of breast: Secondary | ICD-10-CM

## 2015-12-03 DIAGNOSIS — Z114 Encounter for screening for human immunodeficiency virus [HIV]: Secondary | ICD-10-CM

## 2015-12-03 DIAGNOSIS — K089 Disorder of teeth and supporting structures, unspecified: Secondary | ICD-10-CM

## 2015-12-03 DIAGNOSIS — Z1211 Encounter for screening for malignant neoplasm of colon: Secondary | ICD-10-CM

## 2015-12-03 DIAGNOSIS — G8929 Other chronic pain: Secondary | ICD-10-CM

## 2015-12-03 LAB — COMPLETE METABOLIC PANEL WITH GFR
ALT: 19 U/L (ref 6–29)
AST: 17 U/L (ref 10–35)
Albumin: 3.5 g/dL — ABNORMAL LOW (ref 3.6–5.1)
Alkaline Phosphatase: 55 U/L (ref 33–130)
BUN: 17 mg/dL (ref 7–25)
CO2: 25 mmol/L (ref 20–31)
Calcium: 8.9 mg/dL (ref 8.6–10.4)
Chloride: 113 mmol/L — ABNORMAL HIGH (ref 98–110)
Creat: 0.86 mg/dL (ref 0.50–1.05)
GFR, Est African American: 88 mL/min (ref >=60)
GFR, Est Non African American: 76 mL/min (ref >=60)
Glucose, Bld: 90 mg/dL (ref 65–99)
Potassium: 4.3 mmol/L (ref 3.5–5.3)
Sodium: 144 mmol/L (ref 135–146)
Total Bilirubin: 0.3 mg/dL (ref 0.2–1.2)
Total Protein: 5.6 g/dL — ABNORMAL LOW (ref 6.1–8.1)

## 2015-12-03 LAB — CBC WITH DIFFERENTIAL/PLATELET
Basophils Absolute: 0 cells/uL (ref 0–200)
Basophils Relative: 0 %
Eosinophils Absolute: 94 cells/uL (ref 15–500)
Eosinophils Relative: 2 %
HCT: 40.9 % (ref 35.0–45.0)
Hemoglobin: 13.1 g/dL (ref 11.7–15.5)
Lymphocytes Relative: 38 %
Lymphs Abs: 1786 cells/uL (ref 850–3900)
MCH: 29.7 pg (ref 27.0–33.0)
MCHC: 32 g/dL (ref 32.0–36.0)
MCV: 92.7 fL (ref 80.0–100.0)
MPV: 10.6 fL (ref 7.5–12.5)
Monocytes Absolute: 235 cells/uL (ref 200–950)
Monocytes Relative: 5 %
Neutro Abs: 2585 cells/uL (ref 1500–7800)
Neutrophils Relative %: 55 %
Platelets: 237 10*3/uL (ref 140–400)
RBC: 4.41 MIL/uL (ref 3.80–5.10)
RDW: 13.4 % (ref 11.0–15.0)
WBC: 4.7 10*3/uL (ref 3.8–10.8)

## 2015-12-03 LAB — LIPID PANEL
Cholesterol: 215 mg/dL — ABNORMAL HIGH (ref 125–200)
HDL: 44 mg/dL — ABNORMAL LOW (ref >=46)
LDL Cholesterol: 156 mg/dL — ABNORMAL HIGH (ref <130)
Total CHOL/HDL Ratio: 4.9 Ratio (ref <=5.0)
Triglycerides: 75 mg/dL (ref <150)
VLDL: 15 mg/dL (ref <30)

## 2015-12-03 NOTE — Progress Notes (Signed)
[name], is a 55 y.o. female  [id number]  [id number]  DOB - [birth year]  CC:  Chief Complaint  Patient presents with  . Establish Care       HPI: [name] is a 55 y.o. female here to establish care. She has been absent from medical care due to lack of funds.  She has a history of DVT after surgery last year.She has a history of low back pain that is worsening and foot pain. She also request a dental referral to to dental pain and loose teeth.   Health maintenance: Declines immunizations today. She needs screening for HIV and Hep C. She needs a mammogram. Has had a hysterectomy. Needs colon cancer screening. She reports smoking 5 cigarettes a day and denies alcohol or drug use.   No Known Allergies Past Medical History:  Diagnosis Date  . DVT (deep venous thrombosis) ([place])   . Headache(784.0)    otc meds prn   . SVD (spontaneous vaginal delivery)    x 5   Current Outpatient Prescriptions on File Prior to Visit  Medication Sig Dispense Refill  . HYDROcodone-acetaminophen (NORCO/VICODIN) 5-325 MG tablet Take 1 tablet by mouth every 6 (six) hours as needed for moderate pain. (Patient not taking: Reported on 09/02/2015) 15 tablet 0  . methocarbamol (ROBAXIN) 500 MG tablet Take 1 tablet (500 mg total) by mouth 2 (two) times daily. (Patient not taking: Reported on 09/02/2015) 20 tablet 0  . naproxen (NAPROSYN) 500 MG tablet Take 1 tablet (500 mg total) by mouth 2 (two) times daily. 30 tablet 0  . Rivaroxaban 15 & 20 MG TBPK Take as directed on package: Start with one 15mg  tablet by mouth twice a day with food. On Day 22, switch to one 20mg  tablet once a day with food. (Patient not taking: Reported on 09/02/2015) 51 each 0  . traMADol (ULTRAM) 50 MG tablet Take 1 tablet (50 mg total) by mouth every 6 (six) hours as needed. 15 tablet 0   No current facility-administered medications on file prior to visit.    Family History  Problem Relation Age of Onset  . Heart failure  Mother   . Hypertension Mother    Social History   Social History  . Marital status: Married    Spouse name: N/A  . Number of children: N/A  . Years of education: N/A   Occupational History  . Not on file.   Social History Main Topics  . Smoking status: Current Every Day Smoker    Packs/day: 0.50    Years: 38.00  . Smokeless tobacco: Never Used  . Alcohol use No     Comment:    . Drug use: No  . Sexual activity: Yes    Birth control/ protection: Surgical   Other Topics Concern  . Not on file   Social History Narrative  . No narrative on file    Review of Systems: Constitutional: Negative Skin: Positive for intermittent non-specific rash HENT: Positive for missing, loose and painful teeth Eyes: Negative except needs glasses. Neck: Negative Respiratory: Negative Cardiovascular: Posiitve for occ swelling of feet and lower legs Gastrointestinal: Negative Genitourinary: Negative  Musculoskeletal: Positive of chronic, worsening lower back pain Neurological: Negative  Hematological: Positive for perceived easy bruising  Psychiatric/Behavioral: Negative    Objective:   Vitals:   12/03/15 1129  BP: 139/76  Pulse: 80  Resp: 18  Temp: 98.8 F (37.1 C)    Physical Exam: Constitutional: Patient appears well-developed and well-nourished. No  distress. HENT: Normocephalic, atraumatic, External right and left ear normal. Oropharynx is clear and moist. Teeth in poor repair Eyes: Conjunctivae and EOM are normal. PERRLA, no scleral icterus. Neck: Normal ROM. Neck supple. No lymphadenopathy, No thyromegaly. CVS: RRR, S1/S2 +, no murmurs, no gallops, no rubs Pulmonary: Effort and breath sounds normal, no stridor, rhonchi, wheezes, rales.  Abdominal: Soft. Normoactive BS,, no distension, tenderness, rebound or guarding. There is a pulsation heard from lower rib cage to umbilicus. Musculoskeletal: Normal range of motion. No edema and no tenderness.  Neuro: Alert.Normal  muscle tone coordination. Non-focal Skin: Skin is warm and dry. No rash noted. Not diaphoretic. No erythema. No pallor. Psychiatric: Normal mood and affect. Behavior, judgment, thought content normal.  Lab Results  Component Value Date   WBC 3.8 (L) 09/02/2015   HGB 14.3 09/02/2015   HCT 42.0 09/02/2015   MCV 92.2 09/02/2015   PLT 200 09/02/2015   Lab Results  Component Value Date   CREATININE 0.80 09/02/2015   BUN 20 09/02/2015   NA 142 09/02/2015   K 4.4 09/02/2015   CL 109 09/02/2015   CO2 23 09/02/2015    No results found for: HGBA1C Lipid Panel  No results found for: CHOL, TRIG, HDL, CHOLHDL, VLDL, LDLCALC     Assessment and plan:   1. Screening for diabetes mellitus  - Hemoglobin A1c  2. Screening, lipid  - Lipid panel  3. Special screening for malignant neoplasms, colon  - POC Hemoccult Bld/Stl (3-Cd Home Screen); Future  4. Screening for HIV (human immunodeficiency virus)  - HIV antibody (with reflex)  5. Need for hepatitis B screening test  - Hepatitis C Antibody  6. Healthcare maintenance  - COMPLETE METABOLIC PANEL WITH GFR - CBC with Differential  7. Screening for breast cancer  - MM DIGITAL SCREENING BILATERAL; Future  8. Back pain, unspecified location  - Ambulatory referral to Sports Medicine  9. Chronic dental pain  - Ambulatory referral to Dentistry   No Follow-up on file.  The patient was given clear instructions to go to ER or return to medical center if symptoms don't improve, worsen or new problems develop. The patient verbalized understanding.    Micheline Chapman FNP  12/03/2015, 12:14 PM

## 2015-12-03 NOTE — Patient Instructions (Signed)
Will let you know about referral.

## 2015-12-03 NOTE — Progress Notes (Signed)
Patient is here to establish care.  Patient complains of foot pain being present and back pain.  Patient has not taken medication today and patient has not eaten today.

## 2015-12-04 ENCOUNTER — Other Ambulatory Visit: Payer: Self-pay | Admitting: Family Medicine

## 2015-12-04 ENCOUNTER — Other Ambulatory Visit (HOSPITAL_COMMUNITY): Payer: Self-pay | Admitting: *Deleted

## 2015-12-04 ENCOUNTER — Telehealth: Payer: Self-pay

## 2015-12-04 DIAGNOSIS — N6310 Unspecified lump in the right breast, unspecified quadrant: Secondary | ICD-10-CM

## 2015-12-04 LAB — HEMOGLOBIN A1C
Hgb A1c MFr Bld: 5.1 % (ref <5.7)
Mean Plasma Glucose: 100 mg/dL

## 2015-12-04 LAB — HEPATITIS C ANTIBODY: HCV Ab: NEGATIVE

## 2015-12-04 LAB — HIV ANTIBODY (ROUTINE TESTING W REFLEX): HIV 1&2 Ab, 4th Generation: NONREACTIVE

## 2015-12-04 MED ORDER — PRAVASTATIN SODIUM 40 MG PO TABS: 40.0000 mg | ORAL_TABLET | Freq: Every day | ORAL | 1 refills | Status: DC

## 2015-12-04 NOTE — Telephone Encounter (Signed)
Called Both numbers on chart, left message on the 336 number for patient to call back when possible.   We need to advise patient of lab results, see if patient has a current orange card in order to receive dental referral, and then see if we can schedule ultrasound. Thanks!

## 2015-12-04 NOTE — Telephone Encounter (Signed)
Patient called back, I made aware of lab results and the need to start cholesterol medication as directed. Patient verbalized understanding. Patient states she does have orange card and I reviewed with her the process for referrals for orange card and advised her her ultrasound and dental referral will be faxed in. Patient had no other questions at this time. Thanks!~

## 2015-12-04 NOTE — Telephone Encounter (Signed)
-----   Message from Micheline Chapman, NP sent at 12/04/2015  8:10 AM EDT ----- HIV negative. HCV neg.Cmet ok.Chol 215 total, 156 LDL. Need to start medication for cholesterol

## 2015-12-05 ENCOUNTER — Other Ambulatory Visit: Payer: Self-pay

## 2015-12-05 ENCOUNTER — Telehealth: Payer: Self-pay

## 2015-12-05 MED ORDER — PRAVASTATIN SODIUM 40 MG PO TABS: 40.0000 mg | ORAL_TABLET | Freq: Every day | ORAL | 1 refills | Status: DC

## 2015-12-05 NOTE — Telephone Encounter (Signed)
Medication sent into pharmacy. Thanks!  

## 2015-12-05 NOTE — Telephone Encounter (Signed)
[facility] Thanks!

## 2015-12-10 ENCOUNTER — Other Ambulatory Visit: Payer: Self-pay | Admitting: Family Medicine

## 2015-12-10 ENCOUNTER — Ambulatory Visit (HOSPITAL_COMMUNITY)
Admission: RE | Admit: 2015-12-10 | Discharge: 2015-12-10 | Disposition: A | Discharge status: Home / Self Care | Payer: Self-pay | Source: Ambulatory Visit | Attending: Family Medicine | Admitting: Family Medicine

## 2015-12-10 DIAGNOSIS — Z136 Encounter for screening for cardiovascular disorders: Secondary | ICD-10-CM | POA: Insufficient documentation

## 2015-12-20 ENCOUNTER — Ambulatory Visit (HOSPITAL_COMMUNITY)
Admission: RE | Admit: 2015-12-20 | Discharge: 2015-12-20 | Disposition: A | Discharge status: Home / Self Care | Payer: Self-pay | Source: Ambulatory Visit | Attending: Obstetrics and Gynecology | Admitting: Obstetrics and Gynecology

## 2015-12-20 ENCOUNTER — Ambulatory Visit
Admission: RE | Admit: 2015-12-20 | Discharge: 2015-12-20 | Disposition: A | Discharge status: Home / Self Care | Payer: No Typology Code available for payment source | Source: Ambulatory Visit | Attending: Obstetrics and Gynecology | Admitting: Obstetrics and Gynecology

## 2015-12-20 ENCOUNTER — Encounter (HOSPITAL_COMMUNITY): Payer: Self-pay

## 2015-12-20 VITALS — BP 148/80 | Temp 98.9°F | Ht 66.0 in | Wt 166.2 lb

## 2015-12-20 DIAGNOSIS — N6313 Unspecified lump in the right breast, lower outer quadrant: Secondary | ICD-10-CM

## 2015-12-20 DIAGNOSIS — Z1239 Encounter for other screening for malignant neoplasm of breast: Secondary | ICD-10-CM

## 2015-12-20 DIAGNOSIS — N6310 Unspecified lump in the right breast, unspecified quadrant: Secondary | ICD-10-CM

## 2015-12-20 MED FILL — PRAVASTATIN NA 40 MG TAB: 40 | 30 days supply | Qty: 30 | Fill #0

## 2015-12-20 NOTE — Patient Instructions (Addendum)
Explained breast self awareness to Eloise Levels. Patient did not need a Pap smear today due to her history of a hysterectomy for benign reasons. Informed patient that she doesn't need any further Pap smears due to her history of a hysterectomy for benign reasons. Referred patient to the [facility] for diagnostic mammogram and possible right breast ultrasound. Appointment scheduled for Friday, December 20, 2015 at 1430. Discussed smoking cessation with patient. Referred patient to the [facility] Quitline and gave resources to the free smoking cessation classes offered at [facility]. Eloise Levels verbalized understanding.  Jamonica Schoff, Arvil Chaco, RN 3:25 PM

## 2015-12-20 NOTE — Progress Notes (Signed)
Complaints of right breast lump for over a year with pain. Patient states the pain comes and goes. Patient rates the pain at a 8 out of 10.  Pap Smear: Pap smear not completed today. Patient unsure when her last Pap smear was. Patient stated she no has no history of an abnormal Pap smear. Patient has a history of a hysterectomy for fibroids. Patient no longer needs Pap smears due to her history of a hysterectomy for benign reasons per BCCCP and ACOG guidelines. No Pap smear results in EPIC. Report for hysterectomy in EPIC.  Physical exam: Breasts Breasts symmetrical. No skin abnormalities bilateral breasts. No nipple retraction bilateral breasts. No nipple discharge bilateral breasts. No lymphadenopathy. No lumps palpated left breast. Palpated a lump within the right breast at 8 o'clock 2 cm from the nipple. Complaints of tenderness when palpated lump. Referred patient to the [place] [facility] for diagnostic mammogram and possible right breast ultrasound. Appointment scheduled for Friday, December 20, 2015 at 1430.        Pelvic/Bimanual No Pap smear completed today since patient has a history of a hysterectomy for benign reasons. Pap smear not indicated per BCCCP guidelines.   Smoking History: Patient is a current smoker. Discussed smoking cessation with patient. Referred patient to the [facility] Quitline and gave resources to the free smoking cessation classes offered at [facility].  Patient Navigation: Patient education provided. Access to services provided for patient through [place] program.   Colorectal Cancer Screening: Per patient has never had a colonoscopy completed. No complaints today.

## 2015-12-24 ENCOUNTER — Encounter (HOSPITAL_COMMUNITY): Payer: Self-pay | Admitting: *Deleted

## 2015-12-25 ENCOUNTER — Encounter: Payer: Self-pay | Admitting: Student

## 2015-12-25 ENCOUNTER — Ambulatory Visit (HOSPITAL_COMMUNITY)
Admission: RE | Admit: 2015-12-25 | Discharge: 2015-12-25 | Disposition: A | Discharge status: Home / Self Care | Payer: No Typology Code available for payment source | Source: Ambulatory Visit | Attending: Student | Admitting: Student

## 2015-12-25 ENCOUNTER — Ambulatory Visit (INDEPENDENT_AMBULATORY_CARE_PROVIDER_SITE_OTHER): Payer: Self-pay | Admitting: Student

## 2015-12-25 VITALS — BP 152/82 | Ht 66.0 in | Wt 165.0 lb

## 2015-12-25 DIAGNOSIS — M545 Low back pain, unspecified: Secondary | ICD-10-CM

## 2015-12-25 DIAGNOSIS — G8929 Other chronic pain: Secondary | ICD-10-CM

## 2015-12-25 DIAGNOSIS — M5136 Other intervertebral disc degeneration, lumbar region: Secondary | ICD-10-CM | POA: Insufficient documentation

## 2015-12-25 DIAGNOSIS — M5417 Radiculopathy, lumbosacral region: Secondary | ICD-10-CM

## 2015-12-25 DIAGNOSIS — M4316 Spondylolisthesis, lumbar region: Secondary | ICD-10-CM | POA: Insufficient documentation

## 2015-12-25 DIAGNOSIS — M5416 Radiculopathy, lumbar region: Secondary | ICD-10-CM

## 2015-12-25 MED ORDER — MELOXICAM 15 MG PO TABS: 15.0000 mg | ORAL_TABLET | Freq: Every day | ORAL | 2 refills | Status: DC

## 2015-12-25 MED ORDER — PREDNISONE 10 MG PO TABS: ORAL_TABLET | ORAL | 0 refills | Status: DC

## 2015-12-25 MED FILL — ?PREDNISONE 10 MG TABLET: 10 | 6 days supply | Qty: 21 | Fill #0

## 2015-12-25 MED FILL — MELOXICAM 15 MG TABLET: 15 | 30 days supply | Qty: 30 | Fill #0

## 2015-12-25 NOTE — Assessment & Plan Note (Signed)
Will get x-rays to assess any arthritic changes in the lumbar spine. Will treat with physical therapy for help with muscle spasm and strengthening of the core. Will treat acute flare with prednisone Dosepak and then Mobic after. We'll follow-up in 2 weeks.

## 2015-12-25 NOTE — Progress Notes (Signed)
  [name] - 55 y.o. female MRN [id number]  Date of birth: 1960/08/25  SUBJECTIVE:  Including CC & ROS.  CC: low back pain with left sided radiculopathy  Back Pain: Location: low back Duration: years Quality: 8/10 Current Functional Status:  ADL's  Preceding Events: 2 falls. 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  She denies a specific injury. She has never seen anybody for it. He takes intermittent Aleve but this does not help.  ROS: No unexpected weight loss, fever, chills, swelling, instability, muscle pain, redness, +numbness/tingling, otherwise see HPI   PMHx - Updated and reviewed.  Contributory factors include: Negative PSHx - Updated and reviewed.  Contributory factors include:  Negative FHx - Updated and reviewed.  Contributory factors include:  Negative Social Hx - Updated and reviewed. Contributory factors include: Negative, has worked as a Quarry manager for years Medications - reviewed, aleve  DATA REVIEWED: None  PHYSICAL EXAM:  VS: BP:(!) 152/82  HR: bpm  TEMP: ( )  RESP:   HT:5\' 6"  (167.6 cm)   WT:165 lb (74.8 kg)  BMI:26.7 PHYSICAL EXAM: Gen: NAD, alert, cooperative with exam, well-appearing HEENT: clear conjunctiva,  CV:  no edema, capillary refill brisk, normal rate Resp: non-labored Skin: no rashes, normal turgor  Neuro: no gross deficits.  Psych:  alert and oriented  Back Exam:  Inspection: Unremarkable  Palpable tenderness: Lower lumbar paraspinals bilaterally but left greater than right Range of Motion:  Flexion 45 deg; Extension 45 deg; Side Bending to 45 deg bilaterally; Rotation to 45 deg bilaterally  Leg strength: Quad: 5-/5 Hamstring: 5-/5 Hip flexor: 5/5 Hip  abductors: 5/5  Strength at foot: Plantar-flexion: 5/5 Dorsi-flexion: 5/5 Eversion: 5/5 Inversion: 5/5  Sensory change: Gross sensation intact to all lumbar and sacral dermatomes.  Reflexes: 2+ at both patellar tendons, 2+ at achilles tendons, Babinski's downgoing.  Gait unremarkable. SLR sitting: Negative  XSLR sitting: Negative  FABER: negative.   ASSESSMENT & PLAN:   Lumbar back pain with radiculopathy affecting left lower extremity Will get x-rays to assess any arthritic changes in the lumbar spine. Will treat with physical therapy for help with muscle spasm and strengthening of the core. Will treat acute flare with prednisone Dosepak and then Mobic after. We'll follow-up in 2 weeks.

## 2016-01-01 ENCOUNTER — Ambulatory Visit: Payer: No Typology Code available for payment source | Admitting: Physical Therapy

## 2016-01-06 ENCOUNTER — Ambulatory Visit: Payer: No Typology Code available for payment source | Attending: Family Medicine | Admitting: Physical Therapy

## 2016-01-08 ENCOUNTER — Ambulatory Visit: Payer: Self-pay | Admitting: Student

## 2016-01-10 ENCOUNTER — Ambulatory Visit: Payer: Self-pay | Attending: Internal Medicine

## 2016-01-15 ENCOUNTER — Ambulatory Visit (INDEPENDENT_AMBULATORY_CARE_PROVIDER_SITE_OTHER): Payer: Self-pay | Admitting: Student

## 2016-01-15 ENCOUNTER — Encounter: Payer: Self-pay | Admitting: Student

## 2016-01-15 VITALS — BP 183/61 | HR 59 | Ht 66.0 in | Wt 165.0 lb

## 2016-01-15 DIAGNOSIS — M5010 Cervical disc disorder with radiculopathy, unspecified cervical region: Secondary | ICD-10-CM | POA: Insufficient documentation

## 2016-01-15 DIAGNOSIS — M5416 Radiculopathy, lumbar region: Secondary | ICD-10-CM

## 2016-01-15 DIAGNOSIS — M5417 Radiculopathy, lumbosacral region: Secondary | ICD-10-CM

## 2016-01-15 MED ORDER — GABAPENTIN 300 MG PO CAPS: 300.0000 mg | ORAL_CAPSULE | Freq: Every day | ORAL | 1 refills | Status: DC

## 2016-01-15 NOTE — Assessment & Plan Note (Addendum)
Reviewed arthritic changes in the lumbar spine. Due to radicular symptoms and her plan potentially undergo epidural steroid injection if her MRI is positive for nerve impingement, we'll order an MRI of her lumbar spine. She will follow-up to review the results. I started her on gabapentin 300 mg at night to help with nerve pain and to help with sleeping. Recommended vitamin B6 and B12 supplementation. She will start physical therapy this week. Follow-up in 2-3 weeks.

## 2016-01-15 NOTE — Assessment & Plan Note (Addendum)
Believe her neck pain with radiculopathy and likely coming from the neck versus a shoulder. However she is having some decreased range of motion in her shoulder. We'll get x-rays of her cervical spine and of her shoulder. She will start physical therapy this week.  Follow up 2-3 weeks.

## 2016-01-15 NOTE — Progress Notes (Signed)
[name] - 55 y.o. female MRN [id number]  Date of birth: 1960-12-10  SUBJECTIVE:  Including CC & ROS.  CC: Low back pain and neck pain Presents in follow-up for low back pain and reports radiation down the left side of her leg. She reports that the pain keeps her up at night and she is unable to lay on her right side due to pain. It is worse when she is standing for long periods when she goes from a sitting to standing position.  She denies any numbness or tingling. She does feel like her leg gives out at times.  She presents to review her x-ray results, which show diffuse low back pain and anterolisthesis on L4 on the L5 vertebra.  She also complains of the neck pain with radiation down her right arm. This is been ongoing for the past week or 2. Denies any numbness or tingling or decreased strength.  Does have occasional shoulder pain but otherwise it is mostly in her neck. Nothing makes it better really. The prednisone given a few weeks ago did not really help. He takes Motrin occasionally. Her most concerning symptom is pain that keeps her up at night.  ROS: No unexpected weight loss, fever, chills, swelling, instability, muscle pain, redness, +numbness, tingling, otherwise see HPI   PMHx - Updated and reviewed.  Contributory factors include: HTN PSHx - Updated and reviewed.  Contributory factors include:  Negative FHx - Updated and reviewed.  Contributory factors include:  Negative Social Hx - Updated and reviewed. Contributory factors include: Negative Medications - reviewed   DATA REVIEWED: Lumbar x-rays reviewed as stated above.  PHYSICAL EXAM:  VS: BP:(!) 183/61  HR:(!) 59bpm  TEMP: ( )  RESP:   HT:5\' 6"  (167.6 cm)   WT:165 lb (74.8 kg)  BMI:26.7 PHYSICAL EXAM: Gen: NAD, alert, cooperative with exam, well-appearing HEENT: clear conjunctiva,  CV:  no edema, capillary refill brisk, normal rate Resp: non-labored Skin: no rashes, normal turgor  Neuro: no gross deficits.    Psych:  alert and oriented Neck: Inspection unremarkable. No palpable stepoffs. +Spurling's maneuver on right. Globally decreased neck range of motion Grip strength and sensation normal in bilateral hands Strength good C4 to T1 distribution No sensory change to C4 to T1 Negative Hoffman sign bilaterally Reflexes at biceps are normal. Triceps are difficult to elicit as she would not relax.  Shoulder: Inspection reveals no abnormalities, atrophy or asymmetry. Palpation is normal with no tenderness over AC joint or bicipital groove. ROM is decreased secondary to pain in all planes. Rotator cuff strength normal throughout. Speeds and Yergason's tests normal. No labral pathology noted with negative Obrien's, negative clunk and good stability. scapular dyskinesis observed. painful arc, but no drop arm sign.    Back Exam:  Inspection: Unremarkable  Palpable tenderness: Bilateral paraspinal tenderness. Range of Motion:  Globally decreased secondary to pain Leg strength: Quad: 5/5 Hamstring: 5/5 Hip flexor: 5/5 Hip abductors: 5/5  Strength at foot: Plantar-flexion: 5/5 Dorsi-flexion: 5/5 Eversion: 5/5 Inversion: 5/5  Sensory change: Gross sensation intact to all lumbar and sacral dermatomes.  Reflexes: 1+ at the right patellar tendon, 2+ at left patellar tendon, 2+ at achilles tendon on left, is unable to elicit Achilles reflex, Babinski's downgoing.  SLR sitting: Negative  XSLR sitting: Negative   ASSESSMENT & PLAN:   Lumbar back pain with radiculopathy affecting left lower extremity Reviewed arthritic changes in the lumbar spine. Due to radicular symptoms and her plan potentially undergo epidural steroid injection if  her MRI is positive for nerve impingement, we'll order an MRI of her lumbar spine. She will follow-up to review the results. I started her on gabapentin 300 mg at night to help with nerve pain and to help with sleeping. Recommended vitamin B6 and B12 supplementation. She  will start physical therapy this week. Follow-up in 2-3 weeks.  Cervical disc disorder with radiculopathy of cervical region Believe her neck pain with radiculopathy and likely coming from the neck versus a shoulder. However she is having some decreased range of motion in her shoulder. We'll get x-rays of her cervical spine and of her shoulder. She will start physical therapy this week.  Follow up 2-3 weeks.  Patient was counseled reviewing diagnosis and treatment in detail, totaling in 25 minutes, over half of which was spent in face to face counseling.

## 2016-01-21 ENCOUNTER — Ambulatory Visit: Payer: No Typology Code available for payment source | Attending: Family Medicine | Admitting: Physical Therapy

## 2016-01-21 DIAGNOSIS — M5440 Lumbago with sciatica, unspecified side: Secondary | ICD-10-CM | POA: Insufficient documentation

## 2016-01-21 DIAGNOSIS — M5417 Radiculopathy, lumbosacral region: Secondary | ICD-10-CM | POA: Insufficient documentation

## 2016-01-21 DIAGNOSIS — M6281 Muscle weakness (generalized): Secondary | ICD-10-CM | POA: Insufficient documentation

## 2016-01-21 DIAGNOSIS — G8929 Other chronic pain: Secondary | ICD-10-CM | POA: Insufficient documentation

## 2016-01-21 DIAGNOSIS — R262 Difficulty in walking, not elsewhere classified: Secondary | ICD-10-CM | POA: Insufficient documentation

## 2016-01-21 NOTE — Patient Instructions (Signed)
BACK: Child's Pose (Sciatica)    Sit in knee-chest position and reach arms forward. Separate knees for comfort. Hold position for _30 sec __ breaths. Repeat _3_ times. Do __2-3_ times per day.  Copyright  VHI. All rights reserved.    On Elbows (Prone)    Rise up on elbows as high as possible, keeping hips on floor. Hold _30___ seconds. Repeat __3-5__ times per set. Do __1__ sets per session. Do _2___ sessions per day. STOP IF YOUR LEG PAIN OR NUMBNESS INCREASES  http://orth.exer.us/93   Copyright  VHI. All rights reserved.

## 2016-01-22 NOTE — Therapy (Signed)
[place], [place], [address] Phone: [phone]   Fax:  [phone]  Physical Therapy Evaluation  Patient Details  Name: [name] MRN: [id number] Date of Birth: 10-Oct-1960 Referring Provider: Melton Krebs DO   Encounter Date: 01/21/2016      PT End of Session - 01/21/16 1425    Visit Number 1   Number of Visits 16   PT Start Time [id number]  pt arr but took FOTO    PT Stop Time 1430   PT Time Calculation (min) 53 min   Activity Tolerance Patient limited by pain   Behavior During Therapy [facility] for tasks assessed/performed      Past Medical History:  Diagnosis Date  . DVT (deep venous thrombosis) ([place])   . Headache(784.0)    otc meds prn   . SVD (spontaneous vaginal delivery)    x 5    Past Surgical History:  Procedure Laterality Date  . ABDOMINAL HYSTERECTOMY  04/06/2012   Procedure: HYSTERECTOMY ABDOMINAL;  Surgeon: Margarette Asal, MD;  Location: [facility];  Service: Gynecology;  Laterality: N/A;  . BREAST SURGERY     benign turmor removed  . SALPINGOOPHORECTOMY  04/06/2012   Procedure: SALPINGO OOPHORECTOMY;  Surgeon: Margarette Asal, MD;  Location: [facility];  Service: Gynecology;  Laterality: Bilateral;  . TUBAL LIGATION      There were no vitals filed for this visit.       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  She reports difficulty walking, standing long periods.  Her legs are weak.  Pain radiates into LLE when standing and Rt. LE in Rt. sidelying.  Legs go numb if she stands for longer than 15 min    Pertinent History DVT Rt. LE, cervical radiculopathy, recent hysterectomy.    Limitations Sitting;Lifting;Standing;Walking;House hold activities;Other (comment)  sleep   How long can you sit comfortably? 60 min    How long can you stand comfortably? 15 min    How  long can you walk comfortably? 15-20 min    Diagnostic tests XR, new MRI in a week    Patient Stated Goals Less pain, I want to be able to sleep better, stand up better (has to stay bent over)    Currently in Pain? Yes   Pain Score 9    Pain Location Back   Pain Orientation Lower   Pain Descriptors / Indicators Tightness;Other (Comment);Aching;Sharp  pulling    Pain Type Chronic pain   Pain Radiating Towards both , usually one at a time.    Pain Onset More than a month ago   Pain Frequency Constant   Aggravating Factors  prolonged positioning   Pain Relieving Factors heat/tub , gentle movement. HAs not done injections or PT            [facility] PT Assessment - 01/21/16 1355      Assessment   Medical Diagnosis back pain    Referring Provider Melton Krebs DO    Onset Date/Surgical Date --  chronic    Next MD Visit unknown   Prior Therapy No      Precautions   Precautions None     Restrictions   Weight Bearing Restrictions No     Balance Screen   Has the patient fallen in the past 6 months Yes   How  many times? 2   Has the patient had a decrease in activity level because of a fear of falling?  Yes   Is the patient reluctant to leave their home because of a fear of falling?  Yes     [place] Private residence   Living Arrangements Children   Available Help at Discharge Family   Type of [place] Two level   Alternate Level Stairs-Number of Steps 9     Prior Function   Level of Independence Independent     Cognition   Overall Cognitive Status Within Functional Limits for tasks assessed     Observation/Other Assessments   Focus on Therapeutic Outcomes (FOTO)  66%     Sensation   Light Touch Impaired by gross assessment   Additional Comments frequent numb and tingling      Posture/Postural Control   Posture/Postural Control Postural limitations   Postural Limitations Rounded Shoulders;Forward head;Increased lumbar  lordosis;Anterior pelvic tilt     AROM   Lumbar Flexion 50%pain    Lumbar Extension WFL   Lumbar - Right Side Bend 25%   Lumbar - Left Side Bend 25%   Lumbar - Right Rotation 50%   Lumbar - Left Rotation 50%     Strength   Right Hip Flexion 4/5   Left Hip Flexion 4/5   Right Knee Flexion 3+/5   Right Knee Extension 4/5   Left Knee Flexion 3/5   Left Knee Extension 4-/5   Right Ankle Dorsiflexion 3+/5   Left Ankle Dorsiflexion 3/5     Flexibility   Hamstrings 45 deg      Special Tests   Lumbar Tests Straight Leg Raise     Straight Leg Raise   Findings Positive   Side  Left   Comment bilateral pos                    [facility] Adult PT Treatment/Exercise - 01/21/16 1355      Self-Care   ADL's neutral spine with lifting (pots and pans,in oven)    Other Self-Care Comments  HEP, anatomy, eval findings      Lumbar Exercises: Prone   Other Prone Lumbar Exercises press up on elbows x 10    Other Prone Lumbar Exercises childs pose x 3, 30 sec     Moist Heat Therapy   Number Minutes Moist Heat 15 Minutes   Moist Heat Location Lumbar Spine                PT Education - 01/21/16 1425    Education provided Yes   Education Details POC, HEP and PT   Person(s) Educated Patient   Methods Explanation;Demonstration;Handout   Comprehension Verbalized understanding;Returned demonstration          PT Short Term Goals - 01/22/16 0741      PT SHORT TERM GOAL #1   Title Pt will be I with initial HEP for flexibility, strength and pain relief.    Time 4   Period Weeks   Status New     PT SHORT TERM GOAL #2   Title Pt will report centralization of pain (improved 15-25%) with standing, sitting >15 min    Time 4   Period Weeks   Status New     PT SHORT TERM GOAL #3   Title Pt will not have to rely on family to assist with sit to stand    Time 4  Period Weeks   Status New     PT SHORT TERM GOAL #4   Title Pt will complete balance screen and set goal.     Time 4   Period Weeks   Status New           PT Long Term Goals - 01/22/16 0745      PT LONG TERM GOAL #1   Title Pt will understand body mechanics and posture as it relates to her ADLs and mobility.    Time 8   Period Weeks   Status New     PT LONG TERM GOAL #2   Title Pt will be I with more advanced HEP for core stabilization and flexibiity.    Time 8   Period Weeks   Status New     PT LONG TERM GOAL #3   Title Pt will be able to stand for up to 30 min with min pain increase in LEs and back for home tasks, community activities.    Time 8   Period Weeks   Status New     PT LONG TERM GOAL #4   Title Pt will be able to sleep with only min disturbance of pain most of the time.    Time 8   Period Weeks   Status New     PT LONG TERM GOAL #5   Title FOTO score will improve to less than 50% limited   Time 8   Period Weeks   Status New               Plan - 01/21/16 1426    Clinical Impression Statement Pt with mod complexity evaluation  of chronic low back pain.  Pain is worsening to the point where she needs A for getting up from a chair at times, can only stand 15 min and has leg weakness.  She has weakness in bilateral LE's including ankle DF bilat.  She is limited in all aspects of functional mobility and will benefit from PT to iprove her condition.    Rehab Potential Excellent   PT Frequency 2x / week   PT Duration 8 weeks   PT Treatment/Interventions Cryotherapy;Electrical Stimulation;ADLs/Self Care Home Management;Moist Heat;Traction;Ultrasound;Gait training;Functional mobility training;Patient/family education;Passive range of motion;Taping;Manual techniques;Balance training;Therapeutic exercise;Therapeutic activities;Dry needling;Neuromuscular re-education   PT Next Visit Plan HEP, try traction, trunk flexibility, core stab , modalities   PT Home Exercise Plan prone press up and childs pose    Consulted and Agree with Plan of Care Patient       Patient will benefit from skilled therapeutic intervention in order to improve the following deficits and impairments:  Decreased range of motion, Abnormal gait, Difficulty walking, Increased fascial restricitons, Obesity, Impaired UE functional use, Pain, Impaired flexibility, Decreased strength, Impaired sensation, Postural dysfunction, Decreased mobility, Decreased activity tolerance  Visit Diagnosis: Radiculopathy, lumbosacral region  Muscle weakness (generalized)  Difficulty in walking, not elsewhere classified  Chronic low back pain with sciatica, sciatica laterality unspecified, unspecified back pain laterality     Problem List Patient Active Problem List   Diagnosis Date Noted  . Cervical disc disorder with radiculopathy of cervical region 01/15/2016  . Lumbar back pain with radiculopathy affecting left lower extremity 12/25/2015  . VTE (venous thromboembolism) 04/15/2012  . Leiomyoma 04/08/2012    Rodert Hinch 01/22/2016, 7:51 AM  [facility] [address] [place], [place], [address] Phone: [phone]   Fax:  [phone]  Name: [name] MRN: [id number] Date  of Birth: 1960-07-13  Raeford Razor, PT 01/22/16 7:51 AM Phone: [phone] Fax: [phone]

## 2016-01-27 ENCOUNTER — Ambulatory Visit: Payer: No Typology Code available for payment source | Admitting: Physical Therapy

## 2016-01-27 DIAGNOSIS — M6281 Muscle weakness (generalized): Secondary | ICD-10-CM

## 2016-01-27 DIAGNOSIS — M5440 Lumbago with sciatica, unspecified side: Secondary | ICD-10-CM

## 2016-01-27 DIAGNOSIS — R262 Difficulty in walking, not elsewhere classified: Secondary | ICD-10-CM

## 2016-01-27 DIAGNOSIS — G8929 Other chronic pain: Secondary | ICD-10-CM

## 2016-01-27 DIAGNOSIS — M5417 Radiculopathy, lumbosacral region: Secondary | ICD-10-CM

## 2016-01-27 NOTE — Patient Instructions (Signed)

## 2016-01-27 NOTE — Therapy (Signed)
[place], [place], [address] Phone: [phone]   Fax:  [phone]  Physical Therapy Treatment  Patient Details  Name: [name] MRN: [id number] Date of Birth: 19-Jan-1961 Referring Provider: Melton Krebs DO   Encounter Date: 01/27/2016      PT End of Session - 01/27/16 1218    Visit Number 2   Number of Visits 16   PT Start Time [id number]   PT Stop Time [id number]   PT Time Calculation (min) 50 min   Activity Tolerance Patient limited by pain   Behavior During Therapy [facility] for tasks assessed/performed      Past Medical History:  Diagnosis Date  . DVT (deep venous thrombosis) ([place])   . Headache(784.0)    otc meds prn   . SVD (spontaneous vaginal delivery)    x 5    Past Surgical History:  Procedure Laterality Date  . ABDOMINAL HYSTERECTOMY  04/06/2012   Procedure: HYSTERECTOMY ABDOMINAL;  Surgeon: Margarette Asal, MD;  Location: [facility];  Service: Gynecology;  Laterality: N/A;  . BREAST SURGERY     benign turmor removed  . SALPINGOOPHORECTOMY  04/06/2012   Procedure: SALPINGO OOPHORECTOMY;  Surgeon: Margarette Asal, MD;  Location: [facility];  Service: Gynecology;  Laterality: Bilateral;  . TUBAL LIGATION      There were no vitals filed for this visit.      Subjective Assessment - 01/27/16 1146    Subjective Wed and Thurs my legs gave out and I fell and hit the ground.  Caught herself on UEs.  Back was hurting more than usual.    Currently in Pain? Yes   Pain Score 8    Pain Location Back   Pain Orientation Lower;Left;Right   Pain Descriptors / Indicators Aching;Tightness;Burning   Pain Type Chronic pain   Pain Radiating Towards LEs   Pain Onset More than a month ago   Pain Frequency Constant              [facility] Adult PT Treatment/Exercise - 01/27/16 1152      Lumbar Exercises: Stretches   Lower Trunk Rotation 5 reps;10 seconds   Lower Trunk Rotation Limitations x 10    Pelvic Tilt 5 reps;10 seconds     Lumbar Exercises: Supine   Ab Set 10 reps   Clam 10 reps   Bent Knee Raise --     Lumbar Exercises: Prone   Other Prone Lumbar Exercises press up on elbows x 5   Other Prone Lumbar Exercises isometric Tr A contraction x 10 , prone knee flexion x 10   too painful to complete knee flexion      Moist Heat Therapy   Number Minutes Moist Heat 15 Minutes   Moist Heat Location Lumbar Spine     Electrical Stimulation   Electrical Stimulation Location lumbar    Electrical Stimulation Action IFC   Electrical Stimulation Parameters to tol    Electrical Stimulation Goals Pain                PT Education - 01/27/16 1217    Education provided Yes   Education Details stabilization, nerve pain, IFC    Person(s) Educated Patient   Methods Explanation;Demonstration;Handout   Comprehension Verbalized understanding;Need further instruction          PT Short Term Goals - 01/27/16 1220      PT SHORT TERM GOAL #1   Title Pt will be I with initial HEP  for flexibility, strength and pain relief.    Status On-going     PT SHORT TERM GOAL #2   Title Pt will report centralization of pain (improved 15-25%) with standing, sitting >15 min    Status On-going     PT SHORT TERM GOAL #3   Title Pt will not have to rely on family to assist with sit to stand    Status On-going     PT SHORT TERM GOAL #4   Title Pt will complete balance screen and set goal.    Status Unable to assess           PT Long Term Goals - 01/22/16 0745      PT LONG TERM GOAL #1   Title Pt will understand body mechanics and posture as it relates to her ADLs and mobility.    Time 8   Period Weeks   Status New     PT LONG TERM GOAL #2   Title Pt will be I with more advanced HEP for core stabilization and flexibiity.    Time 8   Period Weeks   Status New     PT LONG TERM GOAL #3   Title Pt will be able to stand for up to 30 min with min pain increase in LEs and back for  home tasks, community activities.    Time 8   Period Weeks   Status New     PT LONG TERM GOAL #4   Title Pt will be able to sleep with only min disturbance of pain most of the time.    Time 8   Period Weeks   Status New     PT LONG TERM GOAL #5   Title FOTO score will improve to less than 50% limited   Time 8   Period Weeks   Status New               Plan - 01/27/16 1218    Clinical Impression Statement Pt limited by pain, prone ex increase LE numbness after a short time, although it makes her back better.  She is stiff in hips, pelvis.  Concerning is bilateral LE weakness and burning on bottoms of feet.  MRI tomorrow   PT Next Visit Plan HEP, try traction, trunk flexibility, core stab , modalities, needs balance screen . How was MRI   PT Home Exercise Plan prone press up and childs pose added prepilates    Consulted and Agree with Plan of Care Patient      Patient will benefit from skilled therapeutic intervention in order to improve the following deficits and impairments:  Decreased range of motion, Abnormal gait, Difficulty walking, Increased fascial restricitons, Obesity, Impaired UE functional use, Pain, Impaired flexibility, Decreased strength, Impaired sensation, Postural dysfunction, Decreased mobility, Decreased activity tolerance  Visit Diagnosis: Radiculopathy, lumbosacral region  Muscle weakness (generalized)  Difficulty in walking, not elsewhere classified  Chronic low back pain with sciatica, sciatica laterality unspecified, unspecified back pain laterality     Problem List Patient Active Problem List   Diagnosis Date Noted  . Cervical disc disorder with radiculopathy of cervical region 01/15/2016  . Lumbar back pain with radiculopathy affecting left lower extremity 12/25/2015  . VTE (venous thromboembolism) 04/15/2012  . Leiomyoma 04/08/2012    PAA,JENNIFER 01/27/2016, 1:20 PM  [place], [place], [address] Phone: [phone]   Fax:  [phone]  Name: [name] MRN: [id number] Date of  Birth: 1960-07-05  Raeford Razor, PT 01/27/16 1:20 PM Phone: [phone] Fax: [phone]

## 2016-01-28 ENCOUNTER — Ambulatory Visit
Admission: RE | Admit: 2016-01-28 | Discharge: 2016-01-28 | Disposition: A | Discharge status: Home / Self Care | Payer: No Typology Code available for payment source | Source: Ambulatory Visit | Attending: Student | Admitting: Student

## 2016-01-28 DIAGNOSIS — M5416 Radiculopathy, lumbar region: Secondary | ICD-10-CM

## 2016-01-29 ENCOUNTER — Ambulatory Visit: Payer: Self-pay | Admitting: Student

## 2016-01-29 ENCOUNTER — Ambulatory Visit: Payer: No Typology Code available for payment source | Admitting: Physical Therapy

## 2016-02-03 ENCOUNTER — Telehealth: Payer: Self-pay | Admitting: Physical Therapy

## 2016-02-03 ENCOUNTER — Ambulatory Visit: Payer: No Typology Code available for payment source | Admitting: Physical Therapy

## 2016-02-03 NOTE — Telephone Encounter (Signed)
Left message on patient's voicemail regarding two no show appointments. Her future appointments have been canceled and she will need to call if she would like to continue physical therapy.

## 2016-02-05 ENCOUNTER — Ambulatory Visit: Payer: No Typology Code available for payment source | Admitting: Physical Therapy

## 2016-02-05 ENCOUNTER — Ambulatory Visit (INDEPENDENT_AMBULATORY_CARE_PROVIDER_SITE_OTHER): Payer: Self-pay | Admitting: Student

## 2016-02-05 ENCOUNTER — Encounter: Payer: Self-pay | Admitting: Student

## 2016-02-05 DIAGNOSIS — M5010 Cervical disc disorder with radiculopathy, unspecified cervical region: Secondary | ICD-10-CM

## 2016-02-05 DIAGNOSIS — R262 Difficulty in walking, not elsewhere classified: Secondary | ICD-10-CM

## 2016-02-05 DIAGNOSIS — M5417 Radiculopathy, lumbosacral region: Secondary | ICD-10-CM

## 2016-02-05 DIAGNOSIS — G8929 Other chronic pain: Secondary | ICD-10-CM

## 2016-02-05 DIAGNOSIS — M5440 Lumbago with sciatica, unspecified side: Secondary | ICD-10-CM

## 2016-02-05 DIAGNOSIS — M5416 Radiculopathy, lumbar region: Secondary | ICD-10-CM

## 2016-02-05 DIAGNOSIS — M6281 Muscle weakness (generalized): Secondary | ICD-10-CM

## 2016-02-05 MED ORDER — GABAPENTIN 300 MG PO CAPS: 300.0000 mg | ORAL_CAPSULE | Freq: Every day | ORAL | 1 refills | Status: DC

## 2016-02-05 NOTE — Therapy (Addendum)
[place], [place], [address] Phone: [phone]   Fax:  [phone]  Physical Therapy Treatment, addendum for Discharge  Patient Details  Name: BERONICA LANSDALE MRN: [id number] Date of Birth: [birth year] Referring Provider: Melton Krebs DO   Encounter Date: 02/05/2016      PT End of Session - 02/05/16 1039    Visit Number 3   Number of Visits 16   PT Start Time 7793   PT Stop Time 1115   PT Time Calculation (min) 43 min      Past Medical History:  Diagnosis Date  . DVT (deep venous thrombosis) ([place])   . Headache(784.0)    otc meds prn   . SVD (spontaneous vaginal delivery)    x 5    Past Surgical History:  Procedure Laterality Date  . ABDOMINAL HYSTERECTOMY  04/06/2012   Procedure: HYSTERECTOMY ABDOMINAL;  Surgeon: Margarette Asal, MD;  Location: [facility];  Service: Gynecology;  Laterality: N/A;  . BREAST SURGERY     benign turmor removed  . SALPINGOOPHORECTOMY  04/06/2012   Procedure: SALPINGO OOPHORECTOMY;  Surgeon: Margarette Asal, MD;  Location: [facility];  Service: Gynecology;  Laterality: Bilateral;  . TUBAL LIGATION      There were no vitals filed for this visit.      Subjective Assessment - 02/05/16 1036    Subjective My family took me out of town for thanksgiving and I missed my appointments. I talk to MD about my MRI results this afternoon.    Currently in Pain? Yes   Pain Score 8    Pain Location Back   Pain Orientation Lower;Mid   Pain Descriptors / Indicators Aching   Pain Radiating Towards Left leg   Aggravating Factors  prolonged sitting; getting up fron toilet    Pain Relieving Factors heat/tub                         [facility] Adult PT Treatment/Exercise - 02/05/16 0001      Lumbar Exercises: Supine   Ab Set 10 reps   Clam 10 reps   Heel Slides 10 reps   Bent Knee Raise 10 reps   Bent Knee Raise Limitations pain     Moist Heat Therapy   Number  Minutes Moist Heat 15 Minutes   Moist Heat Location Lumbar Spine     Electrical Stimulation   Electrical Stimulation Location lumbar    Electrical Stimulation Action IFC    Electrical Stimulation Parameters to tol   Electrical Stimulation Goals Pain                  PT Short Term Goals - 01/27/16 1220      PT SHORT TERM GOAL #1   Title Pt will be I with initial HEP for flexibility, strength and pain relief.    Status On-going     PT SHORT TERM GOAL #2   Title Pt will report centralization of pain (improved 15-25%) with standing, sitting >15 min    Status On-going     PT SHORT TERM GOAL #3   Title Pt will not have to rely on family to assist with sit to stand    Status On-going     PT SHORT TERM GOAL #4   Title Pt will complete balance screen and set goal.    Status Unable to assess           PT Long  Term Goals - 01/22/16 0745      PT LONG TERM GOAL #1   Title Pt will understand body mechanics and posture as it relates to her ADLs and mobility.    Time 8   Period Weeks   Status New     PT LONG TERM GOAL #2   Title Pt will be I with more advanced HEP for core stabilization and flexibiity.    Time 8   Period Weeks   Status New     PT LONG TERM GOAL #3   Title Pt will be able to stand for up to 30 min with min pain increase in LEs and back for home tasks, community activities.    Time 8   Period Weeks   Status New     PT LONG TERM GOAL #4   Title Pt will be able to sleep with only min disturbance of pain most of the time.    Time 8   Period Weeks   Status New     PT LONG TERM GOAL #5   Title FOTO score will improve to less than 50% limited   Time 8   Period Weeks   Status New               Plan - 02/05/16 1046    Clinical Impression Statement Pt 18 minutes late so we reviewed Prepilates HEP issued last visit and repeated IFC. Knee folds are painful so we omitted those. Heel slides with ab set more comfortable. She reports IFC was very  helpful last visit. She has an Aleve version of IFC however she does not have anymore pads for it. She will talk to a case manager soon who maybe able to assist her with getting a TENS and raised toilet seat. She sees MD today for follow up from MRI results and will schedule more appointments if MD thinks she should continue PT.    PT Next Visit Plan HEP, try traction? , trunk flexibility, core stab , modalities, needs balance screen . MRI results? What did MD say?    PT Home Exercise Plan prone press up and childs pose added prepilates       Patient will benefit from skilled therapeutic intervention in order to improve the following deficits and impairments:  Decreased range of motion, Abnormal gait, Difficulty walking, Increased fascial restricitons, Obesity, Impaired UE functional use, Pain, Impaired flexibility, Decreased strength, Impaired sensation, Postural dysfunction, Decreased mobility, Decreased activity tolerance  Visit Diagnosis: Radiculopathy, lumbosacral region  Muscle weakness (generalized)  Difficulty in walking, not elsewhere classified  Chronic low back pain with sciatica, sciatica laterality unspecified, unspecified back pain laterality     Problem List Patient Active Problem List   Diagnosis Date Noted  . Cervical disc disorder with radiculopathy of cervical region 01/15/2016  . Lumbar back pain with radiculopathy affecting left lower extremity 12/25/2015  . VTE (venous thromboembolism) 04/15/2012  . Leiomyoma 04/08/2012    Dorene Ar, PTA 02/05/2016, 2:21 PM  [facility] [address] [place], [place], [address] Phone: [phone]   Fax:  [phone]  Name: TELESHA DEGUZMAN MRN: [id number] Date of Birth: 10-Jul-1960  PHYSICAL THERAPY DISCHARGE SUMMARY  Visits from Start of Care: 3  Current functional level related to goals / functional outcomes: See above for most recent.  Did not return  after last visit.    Remaining deficits: Unknown, see above.     Education / Equipment: Posture, HEP, core  Plan: Patient agrees to discharge.  Patient goals were not met. Patient is being discharged due to not returning since the last visit.  ?????     Raeford Razor, PT 04/27/16 2:18 PM Phone: [phone] Fax: [phone]

## 2016-02-05 NOTE — Progress Notes (Signed)
  [name] - 55 y.o. female MRN [id number]  Date of birth: [birth year]  SUBJECTIVE:  Including CC & ROS.  CC: low back pain with radiculopathy   Presents in follow-up for MRI and x-ray results. The results with patient in which includes anterolisthesis 3.5 mm L4 on L5. 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  She is still having symptoms and would like some relief.  She is not necessarily having an exacerbation, but having ongoing chronic symptoms.  She is going to PT with minimal relief.  She was not able to pick up the prescription for gabapentin, would like another one to try.     PHYSICAL EXAM:  VS: BP:(!) 143/85  HR: bpm  TEMP: ( )  RESP:   HT:5\' 4"  (162.6 cm)   WT:160 lb (72.6 kg)  BMI:27.5 PHYSICAL EXAM: Gen: NAD, alert, cooperative with exam, well-appearing HEENT: clear conjunctiva,  CV:  no edema, capillary refill brisk, normal rate Resp: non-labored Skin: no rashes, normal turgor  Neuro: no gross deficits.  Psych:  alert and oriented Back Exam:  Inspection: Unremarkable  Palpable tenderness: bilateral paraspinal hypertonicity. Range of Motion:  Globally diminished ROM due to pain Leg strength: Mild decrease in all strength on right compared to left, possible secondary to pain Strength at foot: Plantar-flexion: 5/5 Dorsi-flexion: 5/5 Eversion: 5/5 Inversion: 5/5  Sensory change: Gross sensation intact to all lumbar and sacral dermatomes.  Reflexes: 1+ at right patellar tendon, 2+ left patellar tendon.   ASSESSMENT & PLAN:   Lumbar back pain with radiculopathy affecting right lower extremity Will refer to back surgeon for consult regarding radiculopathy.  Having some mild weakness.  Discussed surgical options versus epidural steroid injections.

## 2016-02-05 NOTE — Assessment & Plan Note (Signed)
Will refer to back surgeon for consult regarding radiculopathy.  Having some mild weakness.  Discussed surgical options versus epidural steroid injections.

## 2016-02-10 ENCOUNTER — Encounter: Payer: Self-pay | Admitting: Physical Therapy

## 2016-02-12 ENCOUNTER — Encounter: Payer: Self-pay | Admitting: Physical Therapy

## 2016-02-17 ENCOUNTER — Encounter: Payer: Self-pay | Admitting: Physical Therapy

## 2016-02-19 ENCOUNTER — Encounter: Payer: Self-pay | Admitting: Physical Therapy

## 2016-03-04 ENCOUNTER — Ambulatory Visit (INDEPENDENT_AMBULATORY_CARE_PROVIDER_SITE_OTHER): Payer: Self-pay | Admitting: Orthopaedic Surgery

## 2016-03-27 ENCOUNTER — Encounter (INDEPENDENT_AMBULATORY_CARE_PROVIDER_SITE_OTHER): Payer: Self-pay | Admitting: Orthopaedic Surgery

## 2016-03-27 ENCOUNTER — Ambulatory Visit (INDEPENDENT_AMBULATORY_CARE_PROVIDER_SITE_OTHER): Payer: Self-pay

## 2016-03-27 ENCOUNTER — Ambulatory Visit (INDEPENDENT_AMBULATORY_CARE_PROVIDER_SITE_OTHER): Payer: Self-pay | Admitting: Orthopaedic Surgery

## 2016-03-27 VITALS — BP 124/72 | HR 101 | Ht 64.0 in | Wt 165.0 lb

## 2016-03-27 DIAGNOSIS — G8929 Other chronic pain: Secondary | ICD-10-CM

## 2016-03-27 DIAGNOSIS — M545 Low back pain, unspecified: Secondary | ICD-10-CM

## 2016-03-27 DIAGNOSIS — M542 Cervicalgia: Secondary | ICD-10-CM

## 2016-03-27 NOTE — Progress Notes (Signed)
Office Visit Note   Patient: [name]           Date of Birth: 1961/02/06           MRN: [id number] Visit Date: 03/27/2016              Requested by: Micheline Chapman, NP [place], [place] [address] PCP: Sharon Seller, NP   Assessment & Plan: Visit Diagnoses:  1. Neck pain   2. Cervicalgia   3. Chronic right-sided low back pain without sciatica     Plan:Patient's been on tramadol muscle relaxants, prednisone pack which did not help. She took some hydrocodone which care were some relief the months ago. MRI scan was reviewed with the patient I gave her copy of the report. She has no compressive lesions. We discussed activity program, exercising, core strengthening, wellness. No indication for surgical intervention the lumbar spine at this time based on exam findings and MRI scan. Office follow-up if she develops radicular symptoms.  Follow-Up Instructions: Return if symptoms worsen or fail to improve.   Orders:  Orders Placed This Encounter  Procedures  . XR Cervical Spine 2 or 3 views   No orders of the defined types were placed in this encounter.     Procedures: No procedures performed   Clinical Data: No additional findings.   Subjective: Chief Complaint  Patient presents with  . Lower Back - Pain  . Neck - Pain    Patient presents with neck and low back pain. She states that she has been dealing with this for a long time. She has been a CNA for 29 years. She has neck pain that is more on the right and radiates down her right arm. She states that the arm aches all of the time. It even wakes her up. She has not had any imaging made of her neck. She also has complaint of low back pain. This pain radiates across her buttocks. It radiates down into both legs and her legs give out. She states that her right leg is worse and that it gets numb. She has had a recent MRI made of her lower back. She has tried Tramadol and Robaxin with no relief. She  did get some relief from Hydrocodone, but is out of it.   Patient currently not working she's been out for about a year. 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 She states she has neck and back pain she states her fairly equivalent and both are rated as severe. She is applied for disability. Positive history for DVT. Lumbar MRI 01/28/2016 show degenerative changes most prominent at L4-5 and L5-S1 with facet degenerative changes and some encroachment without nerve root compression.  Review of Systems  Constitutional: Negative for chills, diaphoresis, fever and unexpected weight change.  HENT: Negative for ear discharge, ear pain and nosebleeds.   Eyes: Negative for discharge and visual disturbance.  Respiratory: Negative for cough, choking and shortness of breath.   Cardiovascular: Negative for chest pain and palpitations.  Gastrointestinal: Negative for abdominal distention and abdominal pain.  Endocrine: Negative for cold intolerance and heat intolerance.  Genitourinary: Negative for flank pain and hematuria.       Previous hysterectomy 2014  Skin: Negative for rash and wound.  Neurological: Negative for seizures and speech difficulty.  Hematological: Negative for adenopathy. Does not bruise/bleed easily.  Psychiatric/Behavioral: Negative for agitation and suicidal ideas.     Objective: Vital Signs: BP 124/72   Pulse (!) 101  Ht 5\' 4"  (1.626 m)   Wt 165 lb (74.8 kg)   LMP 06/13/2011   BMI 28.32 kg/m height 5 feet 4 inches, weight 165 pounds BP 124/72 pulse 101  Physical Exam  Constitutional: She is oriented to person, place, and time. She appears well-developed.  HENT:  Head: Normocephalic.  Right Ear: External ear normal.  Left Ear: External ear normal.  Eyes: Pupils are equal, round, and reactive to light.  Neck: No tracheal deviation present. No thyromegaly present.  Cardiovascular: Normal rate.   Pulmonary/Chest: Effort normal.  Abdominal: Soft.  Musculoskeletal:  Patient  complains of clicking in her neck with rotation. Approximately reflexes are 1+ and symmetrical knee and ankle jerk are 1+ and symmetrical. No lower extremity hyperreflexia and no clonus. Anterior tib EHL is intact she is able to heel and toe walk. Negative Fabere negative straight leg raising negative popped a compression test. Tenderness to palpation from her neck all the way through the thoracic spine down to the lumbosacral junction. Tenderness over the right SI joint. Sensory testing is normal no evidence of upper externally peripheral nerve compression. Full range of motion in the shoulders no shoulder impingement.  Neurological: She is alert and oriented to person, place, and time.  Skin: Skin is warm and dry.  Psychiatric: She has a normal mood and affect. Her behavior is normal.    Ortho Exam  Specialty Comments:  No specialty comments available.  Imaging: No results found.   PMFS History: Patient Active Problem List   Diagnosis Date Noted  . Cervical disc disorder with radiculopathy of cervical region 01/15/2016  . Lumbar back pain with radiculopathy affecting right lower extremity 12/25/2015  . VTE (venous thromboembolism) 04/15/2012  . Leiomyoma 04/08/2012   Past Medical History:  Diagnosis Date  . DVT (deep venous thrombosis) ([place])   . Headache(784.0)    otc meds prn   . SVD (spontaneous vaginal delivery)    x 5    Family History  Problem Relation Age of Onset  . Heart failure Mother   . Hypertension Mother   . Breast cancer Sister   . Diabetes Sister     Past Surgical History:  Procedure Laterality Date  . ABDOMINAL HYSTERECTOMY  04/06/2012   Procedure: HYSTERECTOMY ABDOMINAL;  Surgeon: Margarette Asal, MD;  Location: [facility];  Service: Gynecology;  Laterality: N/A;  . BREAST SURGERY     benign turmor removed  . SALPINGOOPHORECTOMY  04/06/2012   Procedure: SALPINGO OOPHORECTOMY;  Surgeon: Margarette Asal, MD;  Location: [facility];  Service: Gynecology;   Laterality: Bilateral;  . TUBAL LIGATION     Social History   Occupational History  . Not on file.   Social History Main Topics  . Smoking status: Current Every Day Smoker    Packs/day: 0.50    Years: 38.00  . Smokeless tobacco: Never Used  . Alcohol use No     Comment:    . Drug use: No  . Sexual activity: Yes    Birth control/ protection: Surgical

## 2016-06-01 ENCOUNTER — Encounter: Payer: Self-pay | Admitting: Family Medicine

## 2016-06-01 ENCOUNTER — Ambulatory Visit (INDEPENDENT_AMBULATORY_CARE_PROVIDER_SITE_OTHER): Payer: No Typology Code available for payment source | Admitting: Family Medicine

## 2016-06-01 VITALS — BP 136/70 | HR 70 | Temp 98.6°F | Resp 18 | Ht 64.0 in | Wt 173.0 lb

## 2016-06-01 DIAGNOSIS — R6889 Other general symptoms and signs: Secondary | ICD-10-CM

## 2016-06-01 DIAGNOSIS — E785 Hyperlipidemia, unspecified: Secondary | ICD-10-CM

## 2016-06-01 DIAGNOSIS — Z131 Encounter for screening for diabetes mellitus: Secondary | ICD-10-CM

## 2016-06-01 LAB — COMPREHENSIVE METABOLIC PANEL
ALT: 19 U/L (ref 6–29)
AST: 19 U/L (ref 10–35)
Albumin: 2.9 g/dL — ABNORMAL LOW (ref 3.6–5.1)
Alkaline Phosphatase: 58 U/L (ref 33–130)
BUN: 14 mg/dL (ref 7–25)
CO2: 26 mmol/L (ref 20–31)
Calcium: 8.7 mg/dL (ref 8.6–10.4)
Chloride: 113 mmol/L — ABNORMAL HIGH (ref 98–110)
Creat: 0.88 mg/dL (ref 0.50–1.05)
Glucose, Bld: 93 mg/dL (ref 65–99)
Potassium: 3.7 mmol/L (ref 3.5–5.3)
Sodium: 146 mmol/L (ref 135–146)
Total Bilirubin: 0.2 mg/dL (ref 0.2–1.2)
Total Protein: 4.9 g/dL — ABNORMAL LOW (ref 6.1–8.1)

## 2016-06-01 LAB — CBC WITH DIFFERENTIAL/PLATELET
Basophils Absolute: 0 cells/uL (ref 0–200)
Basophils Relative: 0 %
Eosinophils Absolute: 96 cells/uL (ref 15–500)
Eosinophils Relative: 2 %
HCT: 38.6 % (ref 35.0–45.0)
Hemoglobin: 12.4 g/dL (ref 11.7–15.5)
Lymphocytes Relative: 38 %
Lymphs Abs: 1824 cells/uL (ref 850–3900)
MCH: 30.1 pg (ref 27.0–33.0)
MCHC: 32.1 g/dL (ref 32.0–36.0)
MCV: 93.7 fL (ref 80.0–100.0)
MPV: 9.4 fL (ref 7.5–12.5)
Monocytes Absolute: 192 cells/uL — ABNORMAL LOW (ref 200–950)
Monocytes Relative: 4 %
Neutro Abs: 2688 cells/uL (ref 1500–7800)
Neutrophils Relative %: 56 %
Platelets: 228 10*3/uL (ref 140–400)
RBC: 4.12 MIL/uL (ref 3.80–5.10)
RDW: 13.7 % (ref 11.0–15.0)
WBC: 4.8 10*3/uL (ref 3.8–10.8)

## 2016-06-01 LAB — LIPID PANEL
Cholesterol: 171 mg/dL (ref <200)
HDL: 33 mg/dL — ABNORMAL LOW (ref >50)
LDL Cholesterol: 119 mg/dL — ABNORMAL HIGH (ref <100)
Total CHOL/HDL Ratio: 5.2 Ratio — ABNORMAL HIGH (ref <5.0)
Triglycerides: 96 mg/dL (ref <150)
VLDL: 19 mg/dL (ref <30)

## 2016-06-01 LAB — POCT GLYCOSYLATED HEMOGLOBIN (HGB A1C): Hemoglobin A1C: 5.2

## 2016-06-01 MED ORDER — CYCLOBENZAPRINE HCL 10 MG PO TABS: 5.0000 mg | ORAL_TABLET | Freq: Three times a day (TID) | ORAL | 0 refills | Status: DC | PRN

## 2016-06-01 MED ORDER — RIVAROXABAN (XARELTO) VTE STARTER PACK (15 & 20 MG): ORAL_TABLET | ORAL | 0 refills | Status: DC

## 2016-06-01 MED ORDER — MELOXICAM 15 MG PO TABS: 15.0000 mg | ORAL_TABLET | Freq: Every day | ORAL | 2 refills | Status: DC

## 2016-06-01 MED ORDER — PRAVASTATIN SODIUM 40 MG PO TABS: 40.0000 mg | ORAL_TABLET | Freq: Every day | ORAL | 1 refills | Status: DC

## 2016-06-01 MED ORDER — GABAPENTIN 100 MG PO CAPS: 300.0000 mg | ORAL_CAPSULE | Freq: Four times a day (QID) | ORAL | 2 refills | Status: DC | PRN

## 2016-06-01 MED ORDER — TRAMADOL HCL 50 MG PO TABS: 50.0000 mg | ORAL_TABLET | Freq: Four times a day (QID) | ORAL | 0 refills | Status: DC | PRN

## 2016-06-01 NOTE — Progress Notes (Signed)
Patient ID: [name], female    DOB: Jul 29, 1960, 56 y.o.   MRN: [id number]  PCP: Molli Barrows, FNP  Chief Complaint  Patient presents with  . Follow-up    6 month/diabetes/back pain  . Medication Refill    Subjective:  HPI [name] is a 56 y.o. female presents to establish care, back pain, and diabetes screening. Ms. Kettlewell chronic problems include: chronic back pain and hyperlipemia. She was a previous patient of Sharon Seller FNP and will be establishing with Molli Barrows, FNP today. During her last visit here, 11/2015, she was screened for diabetes (A1C 5.1), lipid panel was elevated patient was started on pravastatin, and she was encouraged to obtain a colonoscopy however due to lack of health coverage, she has been unable to obtain colon cancer screening. The only other concern she has is a recent intolerance to heat. Reports feeling warm or hot all the time.  Reports no routine exercise or changes in diet.  She notes recent family stressor related her grandson was recently sent to a boot camp of sorts for participating in illegal activity. He was released temporarily for a visit home and since ran away. She reports this has caused her some stress, but she notes that it is more important for her to take care of herself. Denies any recent chest pain, shortness or breath, or dizziness.   Social History   Social History  . Marital status: Married    Spouse name: N/A  . Number of children: N/A  . Years of education: N/A   Occupational History  . Not on file.   Social History Main Topics  . Smoking status: Current Every Day Smoker    Packs/day: 0.50    Years: 38.00  . Smokeless tobacco: Never Used  . Alcohol use No     Comment:    . Drug use: No  . Sexual activity: Yes    Birth control/ protection: Surgical   Other Topics Concern  . Not on file   Social History Narrative  . No narrative on file    Family History  Problem Relation Age of Onset   . Heart failure Mother   . Hypertension Mother   . Breast cancer Sister   . Diabetes Sister    Review of Systems See HPI Patient Active Problem List   Diagnosis Date Noted  . Cervical disc disorder with radiculopathy of cervical region 01/15/2016  . Lumbar back pain with radiculopathy affecting right lower extremity 12/25/2015  . VTE (venous thromboembolism) 04/15/2012  . 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 Authorizing Provider  gabapentin (NEURONTIN) 300 MG capsule Take 1 capsule (300 mg total) by mouth at bedtime. 78/29/56  Yes Alicia B Chitanand, DO  meloxicam (MOBIC) 15 MG tablet Take 1 tablet (15 mg total) by mouth daily. 21/30/86 57/84/69 Yes Alicia B Chitanand, DO  naproxen (NAPROSYN) 500 MG tablet Take 1 tablet (500 mg total) by mouth 2 (two) times daily. 09/13/15  Yes Margarita Mail, PA-C  pravastatin (PRAVACHOL) 40 MG tablet Take 1 tablet (40 mg total) by mouth daily. 12/05/15  Yes Micheline Chapman, NP  Rivaroxaban 15 & 20 MG TBPK Take as directed on package: Start with one 15mg  tablet by mouth twice a day with food. On Day 22, switch to one 20mg  tablet once a day with food. 08/08/93  Yes Delora Fuel, MD  traMADol Veatrice Bourbon) 50  MG tablet Take 1 tablet (50 mg total) by mouth every 6 (six) hours as needed. 09/13/15  Yes Margarita Mail, PA-C  HYDROcodone-acetaminophen (NORCO/VICODIN) 5-325 MG tablet Take 1 tablet by mouth every 6 (six) hours as needed for moderate pain. Patient not taking: Reported on 06/01/2016 12/28/14   Dalia Heading, PA-C    Past Medical, Surgical Family and Social History reviewed and updated.    Objective:   Today's Vitals   06/01/16 1222  BP: 136/70  Pulse: 70  Resp: 18  Temp: 98.6 F (37 C)  TempSrc: Oral  SpO2: 99%  Weight: 173 lb (78.5 kg)  Height: 5\' 4"  (1.626 m)    Wt Readings from Last 3 Encounters:  06/01/16 173 lb (78.5 kg)  03/27/16 165 lb (74.8  kg)  02/05/16 160 lb (72.6 kg)    Physical Exam  Constitutional: She is oriented to person, place, and time. She appears well-developed and well-nourished.  HENT:  Head: Normocephalic and atraumatic.  Nose: Nose normal.  Mouth/Throat: Oropharynx is clear and moist.  Eyes: Conjunctivae and EOM are normal. Pupils are equal, round, and reactive to light.  Neck: Normal range of motion. Neck supple. No thyromegaly present.  Cardiovascular: Normal rate, regular rhythm, normal heart sounds and intact distal pulses.   Pulmonary/Chest: Effort normal and breath sounds normal.  Abdominal: Soft. Bowel sounds are normal.  Musculoskeletal: She exhibits tenderness.  Chronic low back pain with sciatica   Neurological: She is alert and oriented to person, place, and time.  Skin: Skin is warm and dry.  Psychiatric: She has a normal mood and affect. Her behavior is normal. Judgment and thought content normal.     Assessment & Plan:  1. Screening for diabetes mellitus  - POCT glycosylated hemoglobin (Hb A1C)-5.2  - Comprehensive metabolic panel  2. Hyperlipidemia, unspecified hyperlipidemia type - Lipid panel, continue pravastatin, increase physical activity   3. Heat intolerance - Thyroid Panel With TSH  Return for follow-up 6 months. You will be notified of your lab results.  Carroll Sage. Kenton Kingfisher, MSN, [facility] [address] [place], [place] [address] [phone]

## 2016-06-01 NOTE — Patient Instructions (Signed)
Return in 6 months for a routine follow-up.  We will notify you of your lab results.   Back Pain, Adult Back pain is very common. The pain often gets better over time. The cause of back pain is usually not dangerous. Most people can learn to manage their back pain on their own. Follow these instructions at home: Watch your back pain for any changes. The following actions may help to lessen any pain you are feeling:  Stay active. Start with short walks on flat ground if you can. Try to walk farther each day.  Exercise regularly as told by your doctor. Exercise helps your back heal faster. It also helps avoid future injury by keeping your muscles strong and flexible.  Do not sit, drive, or stand in one place for more than 30 minutes.  Do not stay in bed. Resting more than 1-2 days can slow down your recovery.  Be careful when you bend or lift an object. Use good form when lifting:  Bend at your knees.  Keep the object close to your body.  Do not twist.  Sleep on a firm mattress. Lie on your side, and bend your knees. If you lie on your back, put a pillow under your knees.  Take medicines only as told by your doctor.  Put ice on the injured area.  Put ice in a plastic bag.  Place a towel between your skin and the bag.  Leave the ice on for 20 minutes, 2-3 times a day for the first 2-3 days. After that, you can switch between ice and heat packs.  Avoid feeling anxious or stressed. Find good ways to deal with stress, such as exercise.  Maintain a healthy weight. Extra weight puts stress on your back. Contact a doctor if:  You have pain that does not go away with rest or medicine.  You have worsening pain that goes down into your legs or buttocks.  You have pain that does not get better in one week.  You have pain at night.  You lose weight.  You have a fever or chills. Get help right away if:  You cannot control when you poop (bowel movement) or pee  (urinate).  Your arms or legs feel weak.  Your arms or legs lose feeling (numbness).  You feel sick to your stomach (nauseous) or throw up (vomit).  You have belly (abdominal) pain.  You feel like you may pass out (faint). This information is not intended to replace advice given to you by your health care provider. Make sure you discuss any questions you have with your health care provider. Document Released: 08/12/2007 Document Revised: 08/01/2015 Document Reviewed: 06/27/2013 Elsevier Interactive Patient Education  2017 [employer].

## 2016-06-02 LAB — THYROID PANEL WITH TSH
Free Thyroxine Index: 2.2 (ref 1.4–3.8)
T3 Uptake: 34 % (ref 22–35)
T4, Total: 6.6 ug/dL (ref 4.5–12.0)
TSH: 0.52 mIU/L

## 2016-10-12 ENCOUNTER — Encounter: Payer: Self-pay | Admitting: Family Medicine

## 2016-10-12 ENCOUNTER — Ambulatory Visit (INDEPENDENT_AMBULATORY_CARE_PROVIDER_SITE_OTHER): Payer: Self-pay | Admitting: Family Medicine

## 2016-10-12 VITALS — BP 150/83 | HR 65 | Temp 98.6°F | Resp 14 | Ht 64.0 in | Wt 159.8 lb

## 2016-10-12 DIAGNOSIS — I10 Essential (primary) hypertension: Secondary | ICD-10-CM

## 2016-10-12 MED ORDER — AMLODIPINE BESYLATE 5 MG PO TABS: 5.0000 mg | ORAL_TABLET | Freq: Every day | ORAL | 3 refills | Status: DC

## 2016-10-12 MED FILL — traMADol HCL 50 MG TABS: 50 | 3 days supply | Qty: 15 | Fill #0

## 2016-10-12 MED FILL — AMLODIPINE BESYLATE 5 MG TA: 5 | 30 days supply | Qty: 30 | Fill #0

## 2016-10-12 NOTE — Progress Notes (Signed)
Patient ID: [name], female    DOB: [birth year], 56 y.o.   MRN: [id number]  PCP: Scot Jun, FNP  Chief Complaint  Patient presents with  . Follow-up    Blood pressure    Subjective:  HPI [name] is a 56 y.o. female presents for evaluation of elevated blood pressure. [name] has no prior history of hypertension. Today she reports that she donates plasma at least twice per week and over the course of the last month, her blood pressures have remained persistently elevated. She reports on one occasion her blood pressure was greater than 628 systolic. [name] has a positive family history of hypertension with first degree family members. [name] denies associated headaches, blurring of vision, dizziness, weakness, or fatigue. She admits to stress as she is attempting to obtain disability and her most recently application was denied. 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 [name] is required to have a medical clearance form completed prior to being eligible to donate plasma again.  Social History   Social History  . Marital status: Married    Spouse name: N/A  . Number of children: N/A  . Years of education: N/A   Occupational History  . Not on file.   Social History Main Topics  . Smoking status: Current Every Day Smoker    Packs/day: 0.50    Years: 38.00  . Smokeless tobacco: Never Used  . Alcohol use No     Comment:    . Drug use: No  . Sexual activity: Yes    Birth control/ protection: Surgical   Other Topics Concern  . Not on file   Social History Narrative  . No narrative on file    Family History  Problem Relation Age of Onset  . Heart failure Mother   . Hypertension Mother   . Breast cancer Sister   . Diabetes Sister    Review of Systems  See HPI  Patient Active Problem List   Diagnosis Date Noted  . Cervical disc disorder with radiculopathy of cervical region 01/15/2016  . Lumbar back pain with  radiculopathy affecting right lower extremity 12/25/2015  . VTE (venous thromboembolism) 04/15/2012  . 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 Authorizing Provider  cyclobenzaprine (FLEXERIL) 10 MG tablet Take 0.5 tablets (5 mg total) by mouth 3 (three) times daily as needed for muscle spasms. 06/01/16  Yes Scot Jun, FNP  gabapentin (NEURONTIN) 100 MG capsule Take 3 capsules (300 mg total) by mouth 4 (four) times daily as needed. 06/01/16  Yes Scot Jun, FNP  HYDROcodone-acetaminophen (NORCO/VICODIN) 5-325 MG tablet Take 1 tablet by mouth every 6 (six) hours as needed for moderate pain. 12/28/14  Yes Lawyer, Harrell Gave, PA-C  pravastatin (PRAVACHOL) 40 MG tablet Take 1 tablet (40 mg total) by mouth daily. 06/01/16  Yes Scot Jun, FNP  Rivaroxaban 15 & 20 MG TBPK Take as directed on package: Start with one 15mg  tablet by mouth twice a day with food. On Day 22, switch to one 20mg  tablet once a day with food. 06/01/16  Yes Scot Jun, FNP  traMADol (ULTRAM) 50 MG tablet Take 1 tablet (50 mg total) by mouth every 6 (six) hours as needed. 06/01/16  Yes Scot Jun, FNP    Past Medical, Surgical Family and Social History reviewed and updated.    Objective:  Today's Vitals   10/12/16 1014  BP: (!) 150/83  Pulse: 65  Resp: 14  Temp: 98.6 F (37 C)  TempSrc: Oral  SpO2: 100%  Weight: 159 lb 12.8 oz (72.5 kg)  Height: 5\' 4"  (1.626 m)    Wt Readings from Last 3 Encounters:  10/12/16 159 lb 12.8 oz (72.5 kg)  06/01/16 173 lb (78.5 kg)  03/27/16 165 lb (74.8 kg)   Physical Exam  Constitutional: She is oriented to person, place, and time. She appears well-developed and well-nourished.  HENT:  Head: Normocephalic and atraumatic.  Eyes: Pupils are equal, round, and reactive to light. Conjunctivae and EOM are normal.  Neck: Normal range of motion. Neck supple.   Cardiovascular: Normal rate, regular rhythm, normal heart sounds and intact distal pulses.   Pulmonary/Chest: Effort normal and breath sounds normal.  Musculoskeletal: Normal range of motion.  Neurological: She is alert and oriented to person, place, and time.  Psychiatric: She has a normal mood and affect. Her behavior is normal. Thought content normal.    Assessment & Plan:  1. Essential hypertension, uncontrolled, new diagnosis Meds ordered this encounter  Medications  . amLODipine (NORVASC) 5 MG tablet    Sig: Take 1 tablet (5 mg total) by mouth daily.    Dispense:  90 tablet    Refill:  3    Order Specific Question:   Supervising Provider    Answer:   Tresa Garter [[id number]]   RTC: 2 days for BP recheck and pick-up form for plasma center. 6 weeks for hypertension follow-up   Carroll Sage. Kenton Kingfisher, MSN, FNP-C [facility]  [address] Janye Cower [place], [place] [address] [phone]

## 2016-10-14 ENCOUNTER — Ambulatory Visit (INDEPENDENT_AMBULATORY_CARE_PROVIDER_SITE_OTHER): Payer: Self-pay | Admitting: Family Medicine

## 2016-10-14 VITALS — BP 142/70

## 2016-10-14 DIAGNOSIS — Z719 Counseling, unspecified: Secondary | ICD-10-CM

## 2016-10-23 ENCOUNTER — Ambulatory Visit: Payer: Self-pay | Attending: Family Medicine

## 2016-12-02 ENCOUNTER — Encounter: Payer: Self-pay | Admitting: Family Medicine

## 2016-12-02 ENCOUNTER — Ambulatory Visit (INDEPENDENT_AMBULATORY_CARE_PROVIDER_SITE_OTHER): Payer: Self-pay | Admitting: Family Medicine

## 2016-12-02 ENCOUNTER — Other Ambulatory Visit (HOSPITAL_COMMUNITY)
Admission: RE | Admit: 2016-12-02 | Discharge: 2016-12-02 | Disposition: A | Discharge status: Home / Self Care | Payer: Self-pay | Source: Ambulatory Visit | Attending: Family Medicine | Admitting: Family Medicine

## 2016-12-02 VITALS — BP 134/76 | HR 68 | Temp 98.3°F | Resp 14 | Ht 64.0 in | Wt 163.2 lb

## 2016-12-02 DIAGNOSIS — E038 Other specified hypothyroidism: Secondary | ICD-10-CM

## 2016-12-02 DIAGNOSIS — M25571 Pain in right ankle and joints of right foot: Secondary | ICD-10-CM

## 2016-12-02 DIAGNOSIS — I10 Essential (primary) hypertension: Secondary | ICD-10-CM

## 2016-12-02 DIAGNOSIS — M79672 Pain in left foot: Secondary | ICD-10-CM

## 2016-12-02 DIAGNOSIS — Z1239 Encounter for other screening for malignant neoplasm of breast: Secondary | ICD-10-CM

## 2016-12-02 DIAGNOSIS — Z01419 Encounter for gynecological examination (general) (routine) without abnormal findings: Secondary | ICD-10-CM | POA: Insufficient documentation

## 2016-12-02 DIAGNOSIS — Z862 Personal history of diseases of the blood and blood-forming organs and certain disorders involving the immune mechanism: Secondary | ICD-10-CM

## 2016-12-02 DIAGNOSIS — Z1231 Encounter for screening mammogram for malignant neoplasm of breast: Secondary | ICD-10-CM

## 2016-12-02 LAB — CBC WITH DIFFERENTIAL/PLATELET
Basophils Absolute: 10 cells/uL (ref 0–200)
Basophils Relative: 0.2 %
Eosinophils Absolute: 150 cells/uL (ref 15–500)
Eosinophils Relative: 3 %
HCT: 42.5 % (ref 35.0–45.0)
Hemoglobin: 13.5 g/dL (ref 11.7–15.5)
Lymphs Abs: 1445 cells/uL (ref 850–3900)
MCH: 29 pg (ref 27.0–33.0)
MCHC: 31.8 g/dL — ABNORMAL LOW (ref 32.0–36.0)
MCV: 91.2 fL (ref 80.0–100.0)
MPV: 10.3 fL (ref 7.5–12.5)
Monocytes Relative: 7.1 %
Neutro Abs: 3040 cells/uL (ref 1500–7800)
Neutrophils Relative %: 60.8 %
Platelets: 220 10*3/uL (ref 140–400)
RBC: 4.66 10*6/uL (ref 3.80–5.10)
RDW: 12.6 % (ref 11.0–15.0)
Total Lymphocyte: 28.9 %
WBC mixed population: 355 cells/uL (ref 200–950)
WBC: 5 10*3/uL (ref 3.8–10.8)

## 2016-12-02 LAB — COMPLETE METABOLIC PANEL WITH GFR
AG Ratio: 1.6 (calc) (ref 1.0–2.5)
ALT: 12 U/L (ref 6–29)
AST: 11 U/L (ref 10–35)
Albumin: 3.6 g/dL (ref 3.6–5.1)
Alkaline phosphatase (APISO): 53 U/L (ref 33–130)
BUN: 12 mg/dL (ref 7–25)
CO2: 20 mmol/L (ref 20–32)
Calcium: 8.9 mg/dL (ref 8.6–10.4)
Chloride: 111 mmol/L — ABNORMAL HIGH (ref 98–110)
Creat: 0.82 mg/dL (ref 0.50–1.05)
GFR, Est African American: 93 mL/min/{1.73_m2} (ref >=60)
GFR, Est Non African American: 80 mL/min/{1.73_m2} (ref >=60)
Globulin: 2.2 g/dL (calc) (ref 1.9–3.7)
Glucose, Bld: 98 mg/dL (ref 65–99)
Potassium: 4 mmol/L (ref 3.5–5.3)
Sodium: 140 mmol/L (ref 135–146)
Total Bilirubin: 0.3 mg/dL (ref 0.2–1.2)
Total Protein: 5.8 g/dL — ABNORMAL LOW (ref 6.1–8.1)

## 2016-12-02 LAB — THYROID PANEL WITH TSH
Free Thyroxine Index: 2.5 (ref 1.4–3.8)
T3 Uptake: 29 % (ref 22–35)
T4, Total: 8.7 ug/dL (ref 5.1–11.9)
TSH: 1.42 mIU/L (ref 0.40–4.50)

## 2016-12-02 LAB — POCT URINALYSIS DIP (DEVICE)
Bilirubin Urine: NEGATIVE
Glucose, UA: NEGATIVE mg/dL
Ketones, ur: NEGATIVE mg/dL
Leukocytes, UA: NEGATIVE
Nitrite: NEGATIVE
Protein, ur: NEGATIVE mg/dL
Specific Gravity, Urine: 1.025 (ref 1.005–1.030)
Urobilinogen, UA: 0.2 mg/dL (ref 0.0–1.0)
pH: 5.5 (ref 5.0–8.0)

## 2016-12-02 NOTE — Patient Instructions (Addendum)
Continue medication as prescribed. You will be notified of any abnormal lab results. I will see you back in 6 months!     Steps to Quit Smoking Smoking tobacco can be bad for your health. It can also affect almost every organ in your body. Smoking puts you and people around you at risk for many serious long-lasting (chronic) diseases. Quitting smoking is hard, but it is one of the best things that you can do for your health. It is never too late to quit. What are the benefits of quitting smoking? When you quit smoking, you lower your risk for getting serious diseases and conditions. They can include:  Lung cancer or lung disease.  Heart disease.  Stroke.  Heart attack.  Not being able to have children (infertility).  Weak bones (osteoporosis) and broken bones (fractures).  If you have coughing, wheezing, and shortness of breath, those symptoms may get better when you quit. You may also get sick less often. If you are pregnant, quitting smoking can help to lower your chances of having a baby of low birth weight. What can I do to help me quit smoking? Talk with your doctor about what can help you quit smoking. Some things you can do (strategies) include:  Quitting smoking totally, instead of slowly cutting back how much you smoke over a period of time.  Going to in-person counseling. You are more likely to quit if you go to many counseling sessions.  Using resources and support systems, such as: ? Database administrator with a Social worker. ? Phone quitlines. ? Careers information officer. ? Support groups or group counseling. ? Text messaging programs. ? Mobile phone apps or applications.  Taking medicines. Some of these medicines may have nicotine in them. If you are pregnant or breastfeeding, do not take any medicines to quit smoking unless your doctor says it is okay. Talk with your doctor about counseling or other things that can help you.  Talk with your doctor about using more than  one strategy at the same time, such as taking medicines while you are also going to in-person counseling. This can help make quitting easier. What things can I do to make it easier to quit? Quitting smoking might feel very hard at first, but there is a lot that you can do to make it easier. Take these steps:  Talk to your family and friends. Ask them to support and encourage you.  Call phone quitlines, reach out to support groups, or work with a Social worker.  Ask people who smoke to not smoke around you.  Avoid places that make you want (trigger) to smoke, such as: ? Bars. ? Parties. ? Smoke-break areas at work.  Spend time with people who do not smoke.  Lower the stress in your life. Stress can make you want to smoke. Try these things to help your stress: ? Getting regular exercise. ? Deep-breathing exercises. ? Yoga. ? Meditating. ? Doing a body scan. To do this, close your eyes, focus on one area of your body at a time from head to toe, and notice which parts of your body are tense. Try to relax the muscles in those areas.  Download or buy apps on your mobile phone or tablet that can help you stick to your quit plan. There are many free apps, such as QuitGuide from the [employer] Office manager for Disease Control and Prevention). You can find more support from smokefree.gov and other websites.  This information is not intended to replace advice  given to you by your health care provider. Make sure you discuss any questions you have with your health care provider. Document Released: 12/20/2008 Document Revised: 10/22/2015 Document Reviewed: 07/10/2014 Elsevier Interactive Patient Education  2018 [place] Risks of Smoking Smoking cigarettes is very bad for your health. Tobacco smoke has over 200 known poisons in it. It contains the poisonous gases nitrogen oxide and carbon monoxide. There are over 60 chemicals in tobacco smoke that cause cancer. Smoking is difficult to quit because a  chemical in tobacco, called nicotine, causes addiction or dependence. When you smoke and inhale, nicotine is absorbed rapidly into the bloodstream through your lungs. Both inhaled and non-inhaled nicotine may be addictive. What are the risks of cigarette smoke? Cigarette smokers have an increased risk of many serious medical problems, including:  Lung cancer.  Lung disease, such as pneumonia, bronchitis, and emphysema.  Chest pain (angina) and heart attack because the heart is not getting enough oxygen.  Heart disease and peripheral blood vessel disease.  High blood pressure (hypertension).  Stroke.  Oral cancer, including cancer of the lip, mouth, or voice box.  Bladder cancer.  Pancreatic cancer.  Cervical cancer.  Pregnancy complications, including premature birth.  Stillbirths and smaller newborn babies, birth defects, and genetic damage to sperm.  Early menopause.  Lower estrogen level for women.  Infertility.  Facial wrinkles.  Blindness.  Increased risk of broken bones (fractures).  Senile dementia.  Stomach ulcers and internal bleeding.  Delayed wound healing and increased risk of complications during surgery.  Even smoking lightly shortens your life expectancy by several years.  Because of secondhand smoke exposure, children of smokers have an increased risk of the following:  Sudden infant death syndrome (SIDS).  Respiratory infections.  Lung cancer.  Heart disease.  Ear infections.  What are the benefits of quitting? There are many health benefits of quitting smoking. Here are some of them:  Within days of quitting smoking, your risk of having a heart attack decreases, your blood flow improves, and your lung capacity improves. Blood pressure, pulse rate, and breathing patterns start returning to normal soon after quitting.  Within months, your lungs may clear up completely.  Quitting for 10 years reduces your risk of developing lung cancer  and heart disease to almost that of a nonsmoker.  People who quit may see an improvement in their overall quality of life.  How do I quit smoking? Smoking is an addiction with both physical and psychological effects, and longtime habits can be hard to change. Your health care provider can recommend:  Programs and community resources, which may include group support, education, or talk therapy.  Prescription medicines to help reduce cravings.  Nicotine replacement products, such as patches, gum, and nasal sprays. Use these products only as directed. Do not replace cigarette smoking with electronic cigarettes, which are commonly called e-cigarettes. The safety of e-cigarettes is not known, and some may contain harmful chemicals.  A combination of two or more of these methods.  Where to find more information:  American Lung Association: www.lung.org  American Cancer Society: www.cancer.org Summary  Smoking cigarettes is very bad for your health. Cigarette smokers have an increased risk of many serious medical problems, including several cancers, heart disease, and stroke.  Smoking is an addiction with both physical and psychological effects, and longtime habits can be hard to change.  By stopping right away, you can greatly reduce the risk of medical problems for you and your family.  To  help you quit smoking, your health care provider can recommend programs, community resources, prescription medicines, and nicotine replacement products such as patches, gum, and nasal sprays. This information is not intended to replace advice given to you by your health care provider. Make sure you discuss any questions you have with your health care provider. Document Released: 04/02/2004 Document Revised: 02/28/2016 Document Reviewed: 02/28/2016 Elsevier Interactive Patient Education  2017 [place] DASH stands for "Dietary Approaches to Stop Hypertension." The DASH eating plan  is a healthy eating plan that has been shown to reduce high blood pressure (hypertension). It may also reduce your risk for type 2 diabetes, heart disease, and stroke. The DASH eating plan may also help with weight loss. What are tips for following this plan? General guidelines  Avoid eating more than 2,300 mg (milligrams) of salt (sodium) a day. If you have hypertension, you may need to reduce your sodium intake to 1,500 mg a day.  Limit alcohol intake to no more than 1 drink a day for nonpregnant women and 2 drinks a day for men. One drink equals 12 oz of beer, 5 oz of wine, or 1 oz of hard liquor.  Work with your health care provider to maintain a healthy body weight or to lose weight. Ask what an ideal weight is for you.  Get at least 30 minutes of exercise that causes your heart to beat faster (aerobic exercise) most days of the week. Activities may include walking, swimming, or biking.  Work with your health care provider or diet and nutrition specialist (dietitian) to adjust your eating plan to your individual calorie needs. Reading food labels  Check food labels for the amount of sodium per serving. Choose foods with less than 5 percent of the Daily Value of sodium. Generally, foods with less than 300 mg of sodium per serving fit into this eating plan.  To find whole grains, look for the word "whole" as the first word in the ingredient list. Shopping  Buy products labeled as "low-sodium" or "no salt added."  Buy fresh foods. Avoid canned foods and premade or frozen meals. Cooking  Avoid adding salt when cooking. Use salt-free seasonings or herbs instead of table salt or sea salt. Check with your health care provider or pharmacist before using salt substitutes.  Do not fry foods. Cook foods using healthy methods such as baking, boiling, grilling, and broiling instead.  Cook with heart-healthy oils, such as olive, canola, soybean, or sunflower oil. Meal planning   Eat a  balanced diet that includes: ? 5 or more servings of fruits and vegetables each day. At each meal, try to fill half of your plate with fruits and vegetables. ? Up to 6-8 servings of whole grains each day. ? Less than 6 oz of lean meat, poultry, or fish each day. A 3-oz serving of meat is about the same size as a deck of cards. One egg equals 1 oz. ? 2 servings of low-fat dairy each day. ? A serving of nuts, seeds, or beans 5 times each week. ? Heart-healthy fats. Healthy fats called Omega-3 fatty acids are found in foods such as flaxseeds and coldwater fish, like sardines, salmon, and mackerel.  Limit how much you eat of the following: ? Canned or prepackaged foods. ? Food that is high in trans fat, such as fried foods. ? Food that is high in saturated fat, such as fatty meat. ? Sweets, desserts, sugary drinks, and other foods with added  sugar. ? Full-fat dairy products.  Do not salt foods before eating.  Try to eat at least 2 vegetarian meals each week.  Eat more home-cooked food and less restaurant, buffet, and fast food.  When eating at a restaurant, ask that your food be prepared with less salt or no salt, if possible. What foods are recommended? The items listed may not be a complete list. Talk with your dietitian about what dietary choices are best for you. Grains Whole-grain or whole-wheat bread. Whole-grain or whole-wheat pasta. Brown rice. [place]. Bulgur. Whole-grain and low-sodium cereals. Pita bread. Low-fat, low-sodium crackers. Whole-wheat flour tortillas. Vegetables Fresh or frozen vegetables (raw, steamed, roasted, or grilled). Low-sodium or reduced-sodium tomato and vegetable juice. Low-sodium or reduced-sodium tomato sauce and tomato paste. Low-sodium or reduced-sodium canned vegetables. Fruits All fresh, dried, or frozen fruit. Canned fruit in natural juice (without added sugar). Meat and other protein foods Skinless chicken or [place]. Ground chicken or  [place]. Pork with fat trimmed off. Fish and seafood. Egg whites. Dried beans, peas, or lentils. Unsalted nuts, nut butters, and seeds. Unsalted canned beans. Lean cuts of beef with fat trimmed off. Low-sodium, lean deli meat. Dairy Low-fat (1%) or fat-free (skim) milk. Fat-free, low-fat, or reduced-fat cheeses. Nonfat, low-sodium ricotta or cottage cheese. Low-fat or nonfat yogurt. Low-fat, low-sodium cheese. Fats and oils Soft margarine without trans fats. Vegetable oil. Low-fat, reduced-fat, or light mayonnaise and salad dressings (reduced-sodium). Canola, safflower, olive, soybean, and sunflower oils. Avocado. Seasoning and other foods Herbs. Spices. Seasoning mixes without salt. Unsalted popcorn and pretzels. Fat-free sweets. What foods are not recommended? The items listed may not be a complete list. Talk with your dietitian about what dietary choices are best for you. Grains Baked goods made with fat, such as croissants, muffins, or some breads. Dry pasta or rice meal packs. Vegetables Creamed or fried vegetables. Vegetables in a cheese sauce. Regular canned vegetables (not low-sodium or reduced-sodium). Regular canned tomato sauce and paste (not low-sodium or reduced-sodium). Regular tomato and vegetable juice (not low-sodium or reduced-sodium). Angie Fava. Olives. Fruits Canned fruit in a light or heavy syrup. Fried fruit. Fruit in cream or butter sauce. Meat and other protein foods Fatty cuts of meat. Ribs. Fried meat. Berniece Salines. Sausage. Bologna and other processed lunch meats. Salami. Fatback. Hotdogs. Bratwurst. Salted nuts and seeds. Canned beans with added salt. Canned or smoked fish. Whole eggs or egg yolks. Chicken or [place] with skin. Dairy Whole or 2% milk, cream, and half-and-half. Whole or full-fat cream cheese. Whole-fat or sweetened yogurt. Full-fat cheese. Nondairy creamers. Whipped toppings. Processed cheese and cheese spreads. Fats and oils Butter. Stick margarine. Lard.  Shortening. Ghee. Bacon fat. Tropical oils, such as coconut, palm kernel, or palm oil. Seasoning and other foods Salted popcorn and pretzels. Onion salt, garlic salt, seasoned salt, table salt, and sea salt. Worcestershire sauce. Tartar sauce. Barbecue sauce. Teriyaki sauce. Soy sauce, including reduced-sodium. Steak sauce. Canned and packaged gravies. Fish sauce. Oyster sauce. Cocktail sauce. Horseradish that you find on the shelf. Ketchup. Mustard. Meat flavorings and tenderizers. Bouillon cubes. Hot sauce and Tabasco sauce. Premade or packaged marinades. Premade or packaged taco seasonings. Relishes. Regular salad dressings. Where to find more information:  National Heart, Lung, and [place]: https://wilson-eaton.com/  American Heart Association: www.heart.org Summary  The DASH eating plan is a healthy eating plan that has been shown to reduce high blood pressure (hypertension). It may also reduce your risk for type 2 diabetes, heart disease, and stroke.  With the DASH eating  plan, you should limit salt (sodium) intake to 2,300 mg a day. If you have hypertension, you may need to reduce your sodium intake to 1,500 mg a day.  When on the DASH eating plan, aim to eat more fresh fruits and vegetables, whole grains, lean proteins, low-fat dairy, and heart-healthy fats.  Work with your health care provider or diet and nutrition specialist (dietitian) to adjust your eating plan to your individual calorie needs. This information is not intended to replace advice given to you by your health care provider. Make sure you discuss any questions you have with your health care provider. Document Released: 02/12/2011 Document Revised: 02/17/2016 Document Reviewed: 02/17/2016 Elsevier Interactive Patient Education  2017 [employer].

## 2016-12-02 NOTE — Progress Notes (Signed)
Patient ID: TANNA LOEFFLER, female    DOB: 09/04/1960, 56 y.o.   MRN: [id number]  PCP: Scot Jun, FNP  Chief Complaint  Patient presents with  . Annual Exam    Subjective:  HP BEYLA LONEY is a 56 y.o. female presents for evaluation of annual exam and evaluation of foot pain. Complains of chronic right ankle pain and left foot pain. Denies prior injuries. Right ankle she feels the presence of a painful knot that will not go away. She has only attempted relief with OTC analgesics with only mild improvement of symptoms. She characterized foot and ankle pain as aching. Left foot pain is located to the "ball of foot" worsened by standing and walking for prolonged period time.    Social History   Social History  . Marital status: Married    Spouse name: N/A  . Number of children: N/A  . Years of education: N/A   Occupational History  . Not on file.   Social History Main Topics  . Smoking status: Current Every Day Smoker    Packs/day: 0.50    Years: 38.00  . Smokeless tobacco: Never Used  . Alcohol use No     Comment:    . Drug use: No  . Sexual activity: Yes    Birth control/ protection: Surgical   Other Topics Concern  . Not on file   Social History Narrative  . No narrative on file    Family History  Problem Relation Age of Onset  . Heart failure Mother   . Hypertension Mother   . Breast cancer Sister   . Diabetes Sister    Review of Systems  Constitutional: Negative.   HENT: Negative.   Eyes: Negative.   Respiratory: Negative.  Negative for cough and shortness of breath.   Cardiovascular: Negative.   Gastrointestinal: Negative.   Endocrine: Negative.   Genitourinary: Negative.   Musculoskeletal: Positive for arthralgias. Negative for gait problem, neck pain and neck stiffness.       Right ankle and left foot pain  Skin: Negative.   Allergic/Immunologic: Negative.   Neurological: Negative.   Psychiatric/Behavioral: Negative.     Patient  Active Problem List   Diagnosis Date Noted  . Cervical disc disorder with radiculopathy of cervical region 01/15/2016  . Lumbar back pain with radiculopathy affecting right lower extremity 12/25/2015  . VTE (venous thromboembolism) 04/15/2012  . 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 Authorizing Provider  amLODipine (NORVASC) 5 MG tablet Take 1 tablet (5 mg total) by mouth daily. 10/12/16  Yes Scot Jun, FNP  cyclobenzaprine (FLEXERIL) 10 MG tablet Take 0.5 tablets (5 mg total) by mouth 3 (three) times daily as needed for muscle spasms. 06/01/16  Yes Scot Jun, FNP  gabapentin (NEURONTIN) 100 MG capsule Take 3 capsules (300 mg total) by mouth 4 (four) times daily as needed. 06/01/16  Yes Scot Jun, FNP  HYDROcodone-acetaminophen (NORCO/VICODIN) 5-325 MG tablet Take 1 tablet by mouth every 6 (six) hours as needed for moderate pain. 12/28/14  Yes Lawyer, Harrell Gave, PA-C  pravastatin (PRAVACHOL) 40 MG tablet Take 1 tablet (40 mg total) by mouth daily. 06/01/16  Yes Scot Jun, FNP  traMADol (ULTRAM) 50 MG tablet Take 1 tablet (50 mg total) by mouth every 6 (six) hours as needed. 06/01/16  Yes Scot Jun, FNP    Past Medical, Surgical Family and  Social History reviewed and updated.    Objective:   Today's Vitals   12/02/16 0912  BP: 134/76  Pulse: 68  Resp: 14  Temp: 98.3 F (36.8 C)  TempSrc: Oral  SpO2: 100%  Weight: 163 lb 3.2 oz (74 kg)  Height: 5\' 4"  (1.626 m)    Wt Readings from Last 3 Encounters:  12/02/16 163 lb 3.2 oz (74 kg)  10/12/16 159 lb 12.8 oz (72.5 kg)  06/01/16 173 lb (78.5 kg)    Physical Exam  Constitutional: She is oriented to person, place, and time. She appears well-developed and well-nourished.  HENT:  Head: Normocephalic and atraumatic.  Eyes: Pupils are equal, round, and reactive to light. Conjunctivae and EOM are normal. No  scleral icterus.  Neck: Normal range of motion. Neck supple. No thyromegaly present.  Cardiovascular: Normal rate, regular rhythm, normal heart sounds and intact distal pulses.   Pulmonary/Chest: Effort normal and breath sounds normal.  Abdominal: Soft. Bowel sounds are normal. She exhibits no distension and no mass. There is no tenderness. There is no rebound and no guarding.  Genitourinary:  Genitourinary Comments: Breasts are symmetric without cutaneous changes, nipple inversion or discharge. No masses or tenderness, and no axillary lymphadenopathy. Normal female external genitalia without lesion. No inguinal lymphadenopathy. Vaginal mucosa is pink and moist without lesions. Pap smear obtained.   Musculoskeletal: Normal range of motion.  Lymphadenopathy:    She has no cervical adenopathy.  Neurological: She is alert and oriented to person, place, and time. She displays normal reflexes. She exhibits normal muscle tone. Coordination normal.  Skin: Skin is warm.  Psychiatric: She has a normal mood and affect. Her behavior is normal. Judgment and thought content normal.   Assessment & Plan:  1. Essential hypertension, controlled continue current therapy regimen  2. TSH (thyroid-stimulating hormone deficiency) 3. History of anemia 4. Left foot pain, - Ambulatory referral to Podiatry, 5. Acute right ankle pain, - Ambulatory referral to Podiatry 6. Gynecologic exam normal- Cytology - PAP [facility] 7. Screening for breast cancer- MM Digital Screening  RTC: 6 months for chronic condition follow-up. Check A1C and Hypertension   Carroll Sage. Kenton Kingfisher, MSN, FNP-C [facility]  [address] [place], [place] [address] [phone]

## 2016-12-10 ENCOUNTER — Ambulatory Visit: Payer: Self-pay | Admitting: Podiatry

## 2016-12-10 ENCOUNTER — Telehealth: Payer: Self-pay | Admitting: Family Medicine

## 2016-12-10 VITALS — BP 168/84 | HR 78

## 2016-12-10 DIAGNOSIS — M79671 Pain in right foot: Secondary | ICD-10-CM

## 2016-12-10 DIAGNOSIS — M79672 Pain in left foot: Secondary | ICD-10-CM

## 2016-12-10 DIAGNOSIS — R87610 Atypical squamous cells of undetermined significance on cytologic smear of cervix (ASC-US): Secondary | ICD-10-CM

## 2016-12-10 DIAGNOSIS — L84 Corns and callosities: Secondary | ICD-10-CM

## 2016-12-10 DIAGNOSIS — M779 Enthesopathy, unspecified: Secondary | ICD-10-CM

## 2016-12-10 LAB — CYTOLOGY - PAP
Diagnosis: UNDETERMINED — AB
HPV: NOT DETECTED

## 2016-12-10 NOTE — Telephone Encounter (Signed)
Left a vm for patient to callback regarding results 

## 2016-12-10 NOTE — Telephone Encounter (Signed)
Please contact patient to advise her PAP results were abnormal for the presence of atypical cells. I am referring her to [facility] GYN clinic for further evaluation and management of recent abnormal pap results.   [name]. Kenton Kingfisher, MSN, FNP-C [facility] [place]  [address] [place], [place] [address] [phone]

## 2016-12-10 NOTE — Telephone Encounter (Signed)
Spoke with patient and will work on referral

## 2016-12-14 ENCOUNTER — Telehealth: Payer: Self-pay | Admitting: Family Medicine

## 2016-12-14 DIAGNOSIS — R87610 Atypical squamous cells of undetermined significance on cytologic smear of cervix (ASC-US): Secondary | ICD-10-CM

## 2016-12-14 NOTE — Telephone Encounter (Signed)
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 Referral should be sent to [facility]

## 2016-12-14 NOTE — Telephone Encounter (Signed)
Referral faxed

## 2017-01-05 ENCOUNTER — Telehealth: Payer: Self-pay

## 2017-01-06 NOTE — Telephone Encounter (Signed)
Left a vm for patient to callback 

## 2017-01-06 NOTE — Progress Notes (Addendum)
  Subjective:  Patient ID: [name], female    DOB: 03-28-1960,  MRN: [id number]  No chief complaint on file.  56 y.o. female returns for the above complaint.  Reports callus under the left fifth toe.  Reports knots in the lower leg and ankle that are sore and painful  Objective:   Vitals:   12/10/16 1553  BP: (!) 168/84  Pulse: 78   General AA&O x3. Normal mood and affect.  Vascular Dorsalis pedis and posterior tibial pulses  present 2+ bilaterally  Capillary refill normal to all digits. Pedal hair growth normal.  Neurologic Epicritic sensation grossly present.  Dermatologic  fifth MPJ callus left Interspaces clear of maceration. Nails well groomed and normal in appearance.  Orthopedic: MMT 5/5 in dorsiflexion, plantarflexion, inversion, and eversion. Normal joint ROM without pain or crepitus.    Assessment & Plan:  Patient was evaluated and treated and all questions answered.  L Submet 5 Lesion -Educated on self care -Courtesy debridement today -Advised that she does not meet criteria for routine care  Return if symptoms worsen or fail to improve.

## 2017-01-11 ENCOUNTER — Telehealth: Payer: Self-pay | Admitting: Family Medicine

## 2017-01-11 ENCOUNTER — Ambulatory Visit (INDEPENDENT_AMBULATORY_CARE_PROVIDER_SITE_OTHER): Payer: Self-pay | Admitting: Family Medicine

## 2017-01-11 ENCOUNTER — Ambulatory Visit (HOSPITAL_BASED_OUTPATIENT_CLINIC_OR_DEPARTMENT_OTHER)
Admission: RE | Admit: 2017-01-11 | Discharge: 2017-01-11 | Disposition: A | Discharge status: Home / Self Care | Payer: Self-pay | Source: Ambulatory Visit | Attending: Family Medicine | Admitting: Family Medicine

## 2017-01-11 ENCOUNTER — Encounter: Payer: Self-pay | Admitting: Family Medicine

## 2017-01-11 ENCOUNTER — Ambulatory Visit (HOSPITAL_COMMUNITY)
Admission: RE | Admit: 2017-01-11 | Discharge: 2017-01-11 | Disposition: A | Discharge status: Home / Self Care | Payer: Self-pay | Source: Ambulatory Visit | Attending: Family Medicine | Admitting: Family Medicine

## 2017-01-11 VITALS — BP 146/80 | HR 86 | Temp 98.2°F | Ht 64.0 in | Wt 167.0 lb

## 2017-01-11 DIAGNOSIS — R2241 Localized swelling, mass and lump, right lower limb: Secondary | ICD-10-CM | POA: Insufficient documentation

## 2017-01-11 DIAGNOSIS — L03115 Cellulitis of right lower limb: Secondary | ICD-10-CM

## 2017-01-11 DIAGNOSIS — D571 Sickle-cell disease without crisis: Secondary | ICD-10-CM | POA: Insufficient documentation

## 2017-01-11 DIAGNOSIS — R2232 Localized swelling, mass and lump, left upper limb: Secondary | ICD-10-CM

## 2017-01-11 MED ORDER — DOXYCYCLINE HYCLATE 100 MG PO CAPS: 100.0000 mg | ORAL_CAPSULE | Freq: Two times a day (BID) | ORAL | 0 refills | Status: DC

## 2017-01-11 MED ORDER — CEFTRIAXONE SODIUM 250 MG IJ SOLR
250.0000 mg | Freq: Once | INTRAMUSCULAR | Status: AC
  Administered 2017-01-11: 250 mg via INTRAMUSCULAR

## 2017-01-11 MED FILL — MELOXICAM 15 MG TABLET: 15 | 30 days supply | Qty: 30 | Fill #0

## 2017-01-11 MED FILL — ?PRAVASTATIN SODIUM 40MG TA: 40 | 30 days supply | Qty: 30 | Fill #0

## 2017-01-11 MED FILL — DOXYCYCLINE 100 MG TABLET: 100 | 10 days supply | Qty: 20 | Fill #0

## 2017-01-11 MED FILL — GABAPENTIN 100 MG CAPSULE: 100 | 10 days supply | Qty: 120 | Fill #0

## 2017-01-11 MED FILL — ?CYCLOBENZAPRINE 10 MG TABL: 10 | 20 days supply | Qty: 30 | Fill #0

## 2017-01-11 MED FILL — AMLODIPINE BESYLATE 5 MG TA: 5 | 30 days supply | Qty: 30 | Fill #1

## 2017-01-11 NOTE — Telephone Encounter (Signed)
Received a call from vascular regarding negative doppler results.  Left a voicemail for patient to proceed with antibiotic therapy.    Carroll Sage. Kenton Kingfisher, MSN, FNP-C [facility] [place] [facility]  [address] [place], [place] [address] [phone]

## 2017-01-11 NOTE — Telephone Encounter (Signed)
Please refer patient to [facility] at [facility] in [facility] for atypical squamous cells noted on Pap smear. Please send the note dated 12/02/2016.   Thanks,   Carroll Sage. Kenton Kingfisher, MSN, FNP-C [facility] [place]  [address] [place], [place] [address] [phone]

## 2017-01-11 NOTE — Progress Notes (Signed)
Patient ID: GWYNNETH FABIO, female    DOB: December 20, 1960, 56 y.o.   MRN: [id number]  PCP: Scot Jun, FNP  Chief Complaint  Patient presents with  . Leg Pain    knot on right leg    Subjective:  HPI DAKAYLA DISANTI is a 56 y.o. female presents for evaluation of "knot" on right leg. Symphani reports the present of a painful hardened mass on her right leg. Initially, the mass was mildly tender and as increased in diameter and is now painful and reddened. Pain is exacerbated by standing and walking. She has a similar "knot" like mass laterally on her left arm which is small and mildly tender. Reports a history of a DVT in right leg approximately years ago in which she was anticoagulated for an extended period of time. She denies any recent insect bite, prolonged trips, or blunt injures to right leg or left arm. Social History   Socioeconomic History  . Marital status: Married    Spouse name: Not on file  . Number of children: Not on file  . Years of education: Not on file  . Highest education level: Not on file  Social Needs  . Financial resource strain: Not on file  . Food insecurity - worry: Not on file  . Food insecurity - inability: Not on file  . Transportation needs - medical: Not on file  . Transportation needs - non-medical: Not on file  Occupational History  . Not on file  Tobacco Use  . Smoking status: Current Every Day Smoker    Packs/day: 0.50    Years: 38.00    Pack years: 19.00  . Smokeless tobacco: Never Used  Substance and Sexual Activity  . Alcohol use: No    Comment:    . Drug use: No  . Sexual activity: Yes    Birth control/protection: Surgical  Other Topics Concern  . Not on file  Social History Narrative  . Not on file    Family History  Problem Relation Age of Onset  . Heart failure Mother   . Hypertension Mother   . Breast cancer Sister   . Diabetes Sister    Review of Systems See HPI  Patient Active Problem List   Diagnosis Date  Noted  . Cervical disc disorder with radiculopathy of cervical region 01/15/2016  . Lumbar back pain with radiculopathy affecting right lower extremity 12/25/2015  . VTE (venous thromboembolism) 04/15/2012  . 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 Authorizing Provider  amLODipine (NORVASC) 5 MG tablet Take 1 tablet (5 mg total) by mouth daily. 10/12/16  Yes Scot Jun, FNP  pravastatin (PRAVACHOL) 40 MG tablet Take 1 tablet (40 mg total) by mouth daily. 06/01/16  Yes Scot Jun, FNP  traMADol (ULTRAM) 50 MG tablet Take 1 tablet (50 mg total) by mouth every 6 (six) hours as needed. 06/01/16  Yes Scot Jun, FNP  cyclobenzaprine (FLEXERIL) 10 MG tablet Take 0.5 tablets (5 mg total) by mouth 3 (three) times daily as needed for muscle spasms. Patient not taking: Reported on 01/11/2017 06/01/16   Scot Jun, FNP  gabapentin (NEURONTIN) 100 MG capsule Take 3 capsules (300 mg total) by mouth 4 (four) times daily as needed. Patient not taking: Reported on 01/11/2017 06/01/16   Scot Jun, FNP  HYDROcodone-acetaminophen (NORCO/VICODIN) 5-325 MG tablet Take 1 tablet by mouth every 6 (six)  hours as needed for moderate pain. Patient not taking: Reported on 01/11/2017 12/28/14   Dalia Heading, PA-C    Past Medical, Surgical Family and Social History reviewed and updated.    Objective:   Today's Vitals   01/11/17 0841  BP: (!) 146/80  Pulse: 86  Temp: 98.2 F (36.8 C)  TempSrc: Oral  SpO2: 98%  Weight: 167 lb (75.8 kg)  Height: 5\' 4"  (1.626 m)    Wt Readings from Last 3 Encounters:  01/11/17 167 lb (75.8 kg)  12/02/16 163 lb 3.2 oz (74 kg)  10/12/16 159 lb 12.8 oz (72.5 kg)   Physical Exam  Constitutional: She is oriented to person, place, and time. She appears well-developed and well-nourished.  HENT:  Head: Normocephalic and atraumatic.  Eyes: Conjunctivae are  normal. Pupils are equal, round, and reactive to light.  Neck: Normal range of motion. Neck supple.  Cardiovascular: Normal rate, regular rhythm, normal heart sounds and intact distal pulses.  Pulmonary/Chest: Effort normal and breath sounds normal.  Musculoskeletal: Normal range of motion.  Neurological: She is alert and oriented to person, place, and time.  Skin: Skin is warm and dry. There is erythema.  Increased erythema and tenderness note on the lateral lower right leg surrounding an enlarged nodular mass. Mass noted on left arm was negative for erythema or edema, however was tender with palpation.  Psychiatric: She has a normal mood and affect. Her behavior is normal. Judgment and thought content normal.   Assessment & Plan:  1. Cellulitis of right lower extremity,  2. Arm mass, left 3. Mass of right lower leg  On physical exam, the appearance of patient's right leg is consistent with an cellulitis infection. However,  there is a palpable, hardened, rounded, tender mass noted on exam. Patient has a history of DVT approximately 2 years prior and is not currently taking any anticoagulin or aspirin therapy. With her history and current presentation, it is prudent to obtain a venous doppler of right and leg and I will also obtain a venous doppler of left arm which I feel is less likely a DVT, however will obtain to rule out. For cellulitis infection will order  cefTRIAXone (ROCEPHIN) injection 250 mg now in office and start patient on Doxycyline 100 mg twice daily x 10 days.     RTC: 1 week for follow-up to evaluate skin infection.   Carroll Sage. Kenton Kingfisher, MSN, FNP-C [facility]  [address] [place], [place] [address] [phone]

## 2017-01-11 NOTE — Progress Notes (Signed)
RLE and LUE venous duplex prelim: no DVT or SVT.  Called results to Coralie Keens, RDMS, RVT

## 2017-01-12 NOTE — Telephone Encounter (Signed)
Referral faxed to [facility] on 01/12/2017

## 2017-01-18 ENCOUNTER — Encounter: Payer: Self-pay | Admitting: Family Medicine

## 2017-01-18 ENCOUNTER — Ambulatory Visit (INDEPENDENT_AMBULATORY_CARE_PROVIDER_SITE_OTHER): Payer: Self-pay | Admitting: Family Medicine

## 2017-01-18 VITALS — BP 114/86 | HR 74 | Temp 98.0°F | Ht 64.0 in | Wt 168.0 lb

## 2017-01-18 DIAGNOSIS — N951 Menopausal and female climacteric states: Secondary | ICD-10-CM

## 2017-01-18 DIAGNOSIS — L52 Erythema nodosum: Secondary | ICD-10-CM

## 2017-01-18 DIAGNOSIS — L03115 Cellulitis of right lower limb: Secondary | ICD-10-CM

## 2017-01-18 LAB — THYROID PANEL WITH TSH
Free Thyroxine Index: 2.6 (ref 1.4–3.8)
T3 Uptake: 31 % (ref 22–35)
T4, Total: 8.5 ug/dL (ref 5.1–11.9)
TSH: 1.46 mIU/L (ref 0.40–4.50)

## 2017-01-18 MED ORDER — VENLAFAXINE HCL ER 75 MG PO CP24: 75.0000 mg | ORAL_CAPSULE | Freq: Every day | ORAL | 0 refills | Status: DC

## 2017-01-18 MED ORDER — DOXYCYCLINE HYCLATE 100 MG PO CAPS: 100.0000 mg | ORAL_CAPSULE | Freq: Two times a day (BID) | ORAL | 0 refills | Status: DC

## 2017-01-18 MED FILL — DOXYCYCLINE 100 MG TABLET: 100 | 10 days supply | Qty: 20 | Fill #0

## 2017-01-18 MED FILL — VENLAFAXINE HCL ER 75 MG CA: 75 | 30 days supply | Qty: 30 | Fill #0

## 2017-01-18 NOTE — Progress Notes (Signed)
Patient ID: [name], female    DOB: 05/08/1960, 56 y.o.   MRN: [id number]  PCP: Scot Jun, FNP  Chief Complaint  Patient presents with  . Follow-up    Subjective:  HPI [name] is a 56 y.o. female presents to follow-up right leg infection. Today, ily reports that the"knot" on right leg has increased in diameter and has since developed knots above her right ankle and bilateral hips. The redness has resolved, although the knots she reports are extremely tender to touch. Left arm knot has resolved. At present, she has two days of doxycyline remaining. She denies any past history of similar skin eruptions. Arena reports a history of tuberculosis as a child for which she was treated several years in the past. She has no history of lupus, RA, streptococcal infection, or cancer history.  Also complains of hot flashes which have recently become disruptive for sleep.  Hysterectomy about 2 years ago and reports that since that time she has had intermittent hot flashes.  Reports profuse sweating there is a cold.  She  desires to start some medication today that will help her symptoms. Social History   Socioeconomic History  . Marital status: Married    Spouse name: Not on file  . Number of children: Not on file  . Years of education: Not on file  . Highest education level: Not on file  Social Needs  . Financial resource strain: Not on file  . Food insecurity - worry: Not on file  . Food insecurity - inability: Not on file  . Transportation needs - medical: Not on file  . Transportation needs - non-medical: Not on file  Occupational History  . Not on file  Tobacco Use  . Smoking status: Current Every Day Smoker    Packs/day: 0.50    Years: 38.00    Pack years: 19.00  . Smokeless tobacco: Never Used  Substance and Sexual Activity  . Alcohol use: No    Comment:    . Drug use: No  . Sexual activity: Yes    Birth control/protection: Surgical  Other Topics  Concern  . Not on file  Social History Narrative  . Not on file    Family History  Problem Relation Age of Onset  . Heart failure Mother   . Hypertension Mother   . Breast cancer Sister   . Diabetes Sister    Review of Systems See HPI Patient Active Problem List   Diagnosis Date Noted  . Cervical disc disorder with radiculopathy of cervical region 01/15/2016  . Lumbar back pain with radiculopathy affecting right lower extremity 12/25/2015  . VTE (venous thromboembolism) 04/15/2012  . 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 Authorizing Provider  amLODipine (NORVASC) 5 MG tablet Take 1 tablet (5 mg total) by mouth daily. 10/12/16  Yes Scot Jun, FNP  cyclobenzaprine (FLEXERIL) 10 MG tablet Take 0.5 tablets (5 mg total) by mouth 3 (three) times daily as needed for muscle spasms. 06/01/16  Yes Scot Jun, FNP  doxycycline (VIBRAMYCIN) 100 MG capsule Take 1 capsule (100 mg total) 2 (two) times daily by mouth. 01/11/17  Yes Scot Jun, FNP  gabapentin (NEURONTIN) 100 MG capsule Take 3 capsules (300 mg total) by mouth 4 (four) times daily as needed. 06/01/16  Yes Scot Jun, FNP  pravastatin (PRAVACHOL) 40 MG tablet Take 1 tablet (40 mg  total) by mouth daily. 06/01/16  Yes Scot Jun, FNP  traMADol (ULTRAM) 50 MG tablet Take 1 tablet (50 mg total) by mouth every 6 (six) hours as needed. 06/01/16  Yes Scot Jun, FNP  HYDROcodone-acetaminophen (NORCO/VICODIN) 5-325 MG tablet Take 1 tablet by mouth every 6 (six) hours as needed for moderate pain. Patient not taking: Reported on 01/18/2017 12/28/14   Dalia Heading, PA-C    Past Medical, Surgical Family and Social History reviewed and updated.    Objective:   Today's Vitals   01/18/17 0808  BP: 114/86  Pulse: 74  Temp: 98 F (36.7 C)  TempSrc: Oral  SpO2: 99%  Weight: 168 lb (76.2 kg)  Height: 5\' 4"   (1.626 m)    Wt Readings from Last 3 Encounters:  01/18/17 168 lb (76.2 kg)  01/11/17 167 lb (75.8 kg)  12/02/16 163 lb 3.2 oz (74 kg)    Physical Exam  Constitutional: She is oriented to person, place, and time. She appears well-developed and well-nourished.  HENT:  Head: Normocephalic and atraumatic.  Eyes: Conjunctivae and EOM are normal. Pupils are equal, round, and reactive to light.  Neck: Normal range of motion. Neck supple.  Cardiovascular: Normal rate, regular rhythm, normal heart sounds and intact distal pulses.  Pulmonary/Chest: Effort normal and breath sounds normal.  Musculoskeletal: Normal range of motion.       Legs: Lymphadenopathy:    She has no cervical adenopathy.  Neurological: She is alert and oriented to person, place, and time.  Skin: Skin is warm and dry.  Psychiatric: She has a normal mood and affect. Her behavior is normal. Judgment and thought content normal.   Assessment & Plan:  1. Erythema nodosum 2. Cellulitis of right lower extremity  Symptoms are of painful, tender nodule erupting on bilateral extremities is consistent with the presentation of erythema nodosum. She has remained afebrile, negative for sore throat,or  chills. Erythema of the right leg has resolved with doxycyline and appearance was consistent with a cellulitis infection. Additional nodules have erupted since starting doxycyline, however, are non erythematous. Manifestation of erythema nodosum occurs associated with chronic conditions including sarcoidosis, cancer, lupus, streptococcal infections, thyroiditis, and can occur without cause.  Extending antibiotic therapy in efforts to resolve any underlying skin infection.   3. Hot flashes due to menopause, will trial Venlafaxine 75 mg once daily to improve heat intolerance.  Meds ordered this encounter  Medications  . doxycycline (VIBRAMYCIN) 100 MG capsule    Sig: Take 1 capsule (100 mg total) 2 (two) times daily by mouth.     Dispense:  20 capsule    Refill:  0    Order Specific Question:   Supervising Provider    Answer:   Tresa Garter [id number]  . venlafaxine XR (EFFEXOR XR) 75 MG 24 hr capsule    Sig: Take 1 capsule (75 mg total) daily with breakfast by mouth.    Dispense:  90 capsule    Refill:  0    Order Specific Question:   Supervising Provider    Answer:   Tresa Garter [[id number]]    Return to care in 3 weeks to follow-up on leg nodules and venlafaxine effectiveness for management of hot flashes.  Carroll Sage. Kenton Kingfisher, MSN, FNP-C [facility]  [address] [place], [place] [address] [phone]

## 2017-01-18 NOTE — Patient Instructions (Addendum)
I am extending her doxycycline antibiotic therapy for an additional 10 days.  If you develop fever or chills return for care sooner. I am running a battery of lab tests to try and determine the underlying cause for what I suspect is I am right here.   For hot flashes I am starting you on venlafaxine 75 mg once daily.   He will return to office in 3 weeks to follow-up on venlafaxine therapy as well as to evaluate the nodules on your legs and thighs.  I have attached information regarding venlafaxine as well as erythema nodosum.   Erythema Nodosum Erythema nodosum is a skin condition in which patches of fat under the skin of the lower legs become inflamed. This causes painful bumps (nodules) to form. What are the causes? Common causes of this condition include:  Infections.  Certain medicines, especially birth control pills, penicillin, and sulfa medicines.  Other causes include:  Pregnancy.  Certain inflammatory conditions, including Lupus, Crohn's disease, and thyroid conditions.  In some cases, the cause may not be known. What increases the risk? This condition is more likely to develop in young adult women. What are the signs or symptoms? The main symptom of this condition is large nodules that look like raised bruises and are tender to the touch. These nodules usually appear on the shins, but they may also appear on the arms or the trunk. They gradually change in color from pink to brown, and they leave a dark mark that clears up in several months. Other symptoms include:  Fever.  Fatigue.  Joint pain.  Itchiness.  How is this diagnosed? This condition is diagnosed based on symptoms. To find the underlying condition that caused the erythema nodosum, your health care provider may also do a physical exam, X-rays, and blood tests. How is this treated? Treatment for this condition depends on the cause. The nodules usually go away with treatment of the underlying condition.  Any pain or discomfort may be treated with:  Anti-inflammatory medicines.  Bed rest.  Raising (elevating) the affected area.  Cool compresses.  In some cases, steroids and potassium iodide tablets may be given. Follow these instructions at home:  Take medicines only as directed by your health care provider.  Stay in bed for as long as directed by your health care provider.  Until your symptoms go away, limit any exercising that makes you breathe harder and faster (vigorous).  Elevate the affected leg as directed by your health care provider.  Apply cool compresses to the affected area as directed by your health care provider. Contact a health care provider if:  Your symptoms are not improving.  You have a fever that does not go away. Get help right away if:  Your condition gets worse.  Your pain gets worse.  You have a sore throat.  You vomit repeatedly. This information is not intended to replace advice given to you by your health care provider. Make sure you discuss any questions you have with your health care provider. Document Released: 04/02/2004 Document Revised: 08/01/2015 Document Reviewed: 01/31/2014 Elsevier Interactive Patient Education  2018 [employer]. Menopause Menopause is the normal time of life when menstrual periods stop completely. Menopause is complete when you have missed 12 consecutive menstrual periods. It usually occurs between the ages of 56 years and 93 years. Very rarely does a woman develop menopause before the age of 67 years. At menopause, your ovaries stop producing the female hormones estrogen and progesterone. This can cause  undesirable symptoms and also affect your health. Sometimes the symptoms may occur 4-5 years before the menopause begins. There is no relationship between menopause and:  Oral contraceptives.  Number of children you had.  Race.  The age your menstrual periods started (menarche).  Heavy smokers and very thin  women may develop menopause earlier in life. What are the causes?  The ovaries stop producing the female hormones estrogen and progesterone. Other causes include:  Surgery to remove both ovaries.  The ovaries stop functioning for no known reason.  Tumors of the pituitary gland in the brain.  Medical disease that affects the ovaries and hormone production.  Radiation treatment to the abdomen or pelvis.  Chemotherapy that affects the ovaries.  What are the signs or symptoms?  Hot flashes.  Night sweats.  Decrease in sex drive.  Vaginal dryness and thinning of the vagina causing painful intercourse.  Dryness of the skin and developing wrinkles.  Headaches.  Tiredness.  Irritability.  Memory problems.  Weight gain.  Bladder infections.  Hair growth of the face and chest.  Infertility. More serious symptoms include:  Loss of bone (osteoporosis) causing breaks (fractures).  Depression.  Hardening and narrowing of the arteries (atherosclerosis) causing heart attacks and strokes.  How is this diagnosed?  When the menstrual periods have stopped for 12 straight months.  Physical exam.  Hormone studies of the blood. How is this treated? There are many treatment choices and nearly as many questions about them. The decisions to treat or not to treat menopausal changes is an individual choice made with your health care provider. Your health care provider can discuss the treatments with you. Together, you can decide which treatment will work best for you. Your treatment choices may include:  Hormone therapy (estrogen and progesterone).  Non-hormonal medicines.  Treating the individual symptoms with medicine (for example antidepressants for depression).  Herbal medicines that may help specific symptoms.  Counseling by a psychiatrist or psychologist.  Group therapy.  Lifestyle changes including: ? Eating healthy. ? Regular exercise. ? Limiting caffeine and  alcohol. ? Stress management and meditation.  No treatment.  Follow these instructions at home:  Take the medicine your health care provider gives you as directed.  Get plenty of sleep and rest.  Exercise regularly.  Eat a diet that contains calcium (good for the bones) and soy products (acts like estrogen hormone).  Avoid alcoholic beverages.  Do not smoke.  If you have hot flashes, dress in layers.  Take supplements, calcium, and vitamin D to strengthen bones.  You can use over-the-counter lubricants or moisturizers for vaginal dryness.  Group therapy is sometimes very helpful.  Acupuncture may be helpful in some cases. Contact a health care provider if:  You are not sure you are in menopause.  You are having menopausal symptoms and need advice and treatment.  You are still having menstrual periods after age 36 years.  You have pain with intercourse.  Menopause is complete (no menstrual period for 12 months) and you develop vaginal bleeding.  You need a referral to a specialist (gynecologist, psychiatrist, or psychologist) for treatment. Get help right away if:  You have severe depression.  You have excessive vaginal bleeding.  You fell and think you have a broken bone.  You have pain when you urinate.  You develop leg or chest pain.  You have a fast pounding heart beat (palpitations).  You have severe headaches.  You develop vision problems.  You feel a lump in your  breast.  You have abdominal pain or severe indigestion. This information is not intended to replace advice given to you by your health care provider. Make sure you discuss any questions you have with your health care provider. Document Released: 05/16/2003 Document Revised: 08/01/2015 Document Reviewed: 09/22/2012 Elsevier Interactive Patient Education  2017 [place]. Venlafaxine tablets What is this medicine? VENLAFAXINE (VEN la fax een) is used to treat depression, anxiety and  panic disorder. This medicine may be used for other purposes; ask your health care provider or pharmacist if you have questions. COMMON BRAND NAME(S): Effexor What should I tell my health care provider before I take this medicine? They need to know if you have any of these conditions: -bleeding disorders -glaucoma -heart disease -high blood pressure -high cholesterol -kidney disease -liver disease -low levels of sodium in the blood -mania or bipolar disorder -seizures -suicidal thoughts, plans, or attempt; a previous suicide attempt by you or a family -take medicines that treat or prevent blood clots -thyroid disease -an unusual or allergic reaction to venlafaxine, desvenlafaxine, other medicines, foods, dyes, or preservatives -pregnant or trying to get pregnant -breast-feeding How should I use this medicine? Take this medicine by mouth with a glass of water. Follow the directions on the prescription label. Take it with food. Take your medicine at regular intervals. Do not take your medicine more often than directed. Do not stop taking this medicine suddenly except upon the advice of your doctor. Stopping this medicine too quickly may cause serious side effects or your condition may worsen. A special MedGuide will be given to you by the pharmacist with each prescription and refill. Be sure to read this information carefully each time. Talk to your pediatrician regarding the use of this medicine in children. Special care may be needed. Overdosage: If you think you have taken too much of this medicine contact a poison control center or emergency room at once. NOTE: This medicine is only for you. Do not share this medicine with others. What if I miss a dose? If you miss a dose, take it as soon as you can. If it is almost time for your next dose, take only that dose. Do not take double or extra doses. What may interact with this medicine? Do not take this medicine with any of the following  medications: -certain medicines for fungal infections like fluconazole, itraconazole, ketoconazole, posaconazole, voriconazole -cisapride -desvenlafaxine -dofetilide -dronedarone -duloxetine -levomilnacipran -linezolid -MAOIs like Carbex, Eldepryl, Marplan, Nardil, and Parnate -methylene blue (injected into a vein) -milnacipran -pimozide -thioridazine -ziprasidone This medicine may also interact with the following medications: -amphetamines -aspirin and aspirin-like medicines -certain medicines for depression, anxiety, or psychotic disturbances -certain medicines for migraine headaches like almotriptan, eletriptan, frovatriptan, naratriptan, rizatriptan, sumatriptan, zolmitriptan -certain medicines for sleep -certain medicines that treat or prevent blood clots like dalteparin, enoxaparin, warfarin -cimetidine -clozapine -diuretics -fentanyl -furazolidone -indinavir -isoniazid -lithium -metoprolol -NSAIDS, medicines for pain and inflammation, like ibuprofen or naproxen -other medicines that prolong the QT interval (cause an abnormal heart rhythm) -procarbazine -rasagiline -supplements like St. John's wort, kava kava, valerian -tramadol -tryptophan This list may not describe all possible interactions. Give your health care provider a list of all the medicines, herbs, non-prescription drugs, or dietary supplements you use. Also tell them if you smoke, drink alcohol, or use illegal drugs. Some items may interact with your medicine. What should I watch for while using this medicine? Tell your doctor if your symptoms do not get better or if they get worse. Visit  your doctor or health care professional for regular checks on your progress. Because it may take several weeks to see the full effects of this medicine, it is important to continue your treatment as prescribed by your doctor. Patients and their families should watch out for new or worsening thoughts of suicide or  depression. Also watch out for sudden changes in feelings such as feeling anxious, agitated, panicky, irritable, hostile, aggressive, impulsive, severely restless, overly excited and hyperactive, or not being able to sleep. If this happens, especially at the beginning of treatment or after a change in dose, call your health care professional. This medicine can cause an increase in blood pressure. Check with your doctor for instructions on monitoring your blood pressure while taking this medicine. You may get drowsy or dizzy. Do not drive, use machinery, or do anything that needs mental alertness until you know how this medicine affects you. Do not stand or sit up quickly, especially if you are an older patient. This reduces the risk of dizzy or fainting spells. Alcohol may interfere with the effect of this medicine. Avoid alcoholic drinks. Your mouth may get dry. Chewing sugarless gum, sucking hard candy and drinking plenty of water will help. Contact your doctor if the problem does not go away or is severe. What side effects may I notice from receiving this medicine? Side effects that you should report to your doctor or health care professional as soon as possible: -allergic reactions like skin rash, itching or hives, swelling of the face, lips, or tongue -anxious -breathing problems -confusion -changes in vision -chest pain -confusion -elevated mood, decreased need for sleep, racing thoughts, impulsive behavior -eye pain -fast, irregular heartbeat -feeling faint or lightheaded, falls -feeling agitated, angry, or irritable -hallucination, loss of contact with reality -high blood pressure -loss of balance or coordination -palpitations -redness, blistering, peeling or loosening of the skin, including inside the mouth -restlessness, pacing, inability to keep still -seizures -stiff muscles -suicidal thoughts or other mood changes -trouble passing urine or change in the amount of  urine -trouble sleeping -unusual bleeding or bruising -unusually weak or tired -vomiting Side effects that usually do not require medical attention (report to your doctor or health care professional if they continue or are bothersome): -change in sex drive or performance -change in appetite or weight -constipation -dizziness -dry mouth -headache -increased sweating -nausea -tired This list may not describe all possible side effects. Call your doctor for medical advice about side effects. You may report side effects to FDA at 1-800-FDA-1088. Where should I keep my medicine? Keep out of the reach of children. Store at a controlled temperature between 20 and 25 degrees C (68 and 77 degrees F), in a dry place. Throw away any unused medicine after the expiration date. NOTE: This sheet is a summary. It may not cover all possible information. If you have questions about this medicine, talk to your doctor, pharmacist, or health care provider.  2018 Elsevier/Gold Standard (2015-07-25 18:42:26)

## 2017-01-25 LAB — CBC WITH DIFFERENTIAL/PLATELET
Basophils Absolute: 10 cells/uL (ref 0–200)
Basophils Relative: 0.4 %
Eosinophils Absolute: 70 cells/uL (ref 15–500)
Eosinophils Relative: 2.9 %
HCT: 56.3 % — ABNORMAL HIGH (ref 35.0–45.0)
Hemoglobin: 18.8 g/dL — ABNORMAL HIGH (ref 11.7–15.5)
Lymphs Abs: 924 cells/uL (ref 850–3900)
MCH: 30.1 pg (ref 27.0–33.0)
MCHC: 33.4 g/dL (ref 32.0–36.0)
MCV: 90.1 fL (ref 80.0–100.0)
MPV: 10.3 fL (ref 7.5–12.5)
Monocytes Relative: 5.7 %
Neutro Abs: 1260 cells/uL — ABNORMAL LOW (ref 1500–7800)
Neutrophils Relative %: 52.5 %
Platelets: 129 10*3/uL — ABNORMAL LOW (ref 140–400)
RBC: 6.25 10*6/uL — ABNORMAL HIGH (ref 3.80–5.10)
RDW: 12.5 % (ref 11.0–15.0)
Total Lymphocyte: 38.5 %
WBC mixed population: 137 cells/uL — ABNORMAL LOW (ref 200–950)
WBC: 2.4 10*3/uL — ABNORMAL LOW (ref 3.8–10.8)

## 2017-01-25 LAB — BASIC METABOLIC PANEL
BUN: 13 mg/dL (ref 7–25)
CO2: 19 mmol/L — ABNORMAL LOW (ref 20–32)
Calcium: 9.1 mg/dL (ref 8.6–10.4)
Chloride: 108 mmol/L (ref 98–110)
Creat: 0.77 mg/dL (ref 0.50–1.05)
Glucose, Bld: 104 mg/dL — ABNORMAL HIGH (ref 65–99)
Potassium: 4.3 mmol/L (ref 3.5–5.3)
Sodium: 140 mmol/L (ref 135–146)

## 2017-01-25 LAB — C3 AND C4
C3 Complement: 143 mg/dL (ref 83–193)
C4 Complement: 30 mg/dL (ref 15–57)

## 2017-01-25 LAB — TIQ-NTM

## 2017-01-25 LAB — QUANTIFERON TB GOLD ASSAY (BLOOD)

## 2017-01-25 LAB — ANGIOTENSIN CONVERTING ENZYME: Angiotensin-Converting Enzyme: 17 U/L (ref 9–67)

## 2017-01-25 LAB — SEDIMENTATION RATE: Sed Rate: 14 mm/h (ref 0–30)

## 2017-01-31 NOTE — Progress Notes (Signed)
Abnormal CBC-repeat at this office visit. Send to [facility]oc if remains abnormal

## 2017-02-08 ENCOUNTER — Ambulatory Visit (INDEPENDENT_AMBULATORY_CARE_PROVIDER_SITE_OTHER): Payer: Self-pay | Admitting: Family Medicine

## 2017-02-08 ENCOUNTER — Encounter: Payer: Self-pay | Admitting: Family Medicine

## 2017-02-08 VITALS — BP 140/76 | HR 65 | Temp 98.3°F | Resp 16 | Ht 64.0 in | Wt 162.3 lb

## 2017-02-08 DIAGNOSIS — R229 Localized swelling, mass and lump, unspecified: Secondary | ICD-10-CM

## 2017-02-08 DIAGNOSIS — R232 Flushing: Secondary | ICD-10-CM

## 2017-02-08 DIAGNOSIS — D709 Neutropenia, unspecified: Secondary | ICD-10-CM

## 2017-02-08 MED ORDER — VENLAFAXINE HCL ER 75 MG PO CP24: 150.0000 mg | ORAL_CAPSULE | Freq: Every day | ORAL | 2 refills | Status: DC

## 2017-02-08 NOTE — Progress Notes (Signed)
Patient ID: [name], female    DOB: 12-06-1960, 56 y.o.   MRN: [id number]  PCP: Scot Jun, FNP  Chief Complaint  Patient presents with  . Follow-up    hot flashes and nodule on leg    Subjective:  HPI [name] is a 56 y.o. female presents for evaluation of hot flashes and leg nodules. Dessirae has persistent tender, nodules of the lower right lower extremity for over the course of 2 months. She has been treated with antibiotic  therapy and a course of prednisone without complete resolution. Compliment , sed rate, ACE levels were all within normal range. Prior CBC was significant for leuckopenia, thrombocytopenia, with decreased neutrophils. Patient has no known cancer history, although recent PAP results were abnormal and she is being currently evaluated to rule cervical malignancy. Denies episodic fevers, fatigue, hair thinning, new onset rashes, joints or muscle aches. Cannon was recently prescribed venlafaxine for management of hot flashes. Reports improvement of intensity and occurrences of hot flashes, although episodes continue to occur. She denies any adverse changes in mood or sleep pattern since starting medication.  Social History   Socioeconomic History  . Marital status: Married    Spouse name: Not on file  . Number of children: Not on file  . Years of education: Not on file  . Highest education level: Not on file  Social Needs  . Financial resource strain: Not on file  . Food insecurity - worry: Not on file  . Food insecurity - inability: Not on file  . Transportation needs - medical: Not on file  . Transportation needs - non-medical: Not on file  Occupational History  . Not on file  Tobacco Use  . Smoking status: Current Every Day Smoker    Packs/day: 0.50    Years: 38.00    Pack years: 19.00  . Smokeless tobacco: Never Used  Substance and Sexual Activity  . Alcohol use: No    Comment:    . Drug use: No  . Sexual activity: Yes    Birth  control/protection: Surgical  Other Topics Concern  . Not on file  Social History Narrative  . Not on file    Family History  Problem Relation Age of Onset  . Heart failure Mother   . Hypertension Mother   . Breast cancer Sister   . Diabetes Sister    Review of Systems See HPI Patient Active Problem List   Diagnosis Date Noted  . Cervical disc disorder with radiculopathy of cervical region 01/15/2016  . Lumbar back pain with radiculopathy affecting right lower extremity 12/25/2015  . VTE (venous thromboembolism) 04/15/2012  . 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 Authorizing Provider  amLODipine (NORVASC) 5 MG tablet Take 1 tablet (5 mg total) by mouth daily. 10/12/16  Yes Scot Jun, FNP  cyclobenzaprine (FLEXERIL) 10 MG tablet Take 0.5 tablets (5 mg total) by mouth 3 (three) times daily as needed for muscle spasms. 06/01/16  Yes Scot Jun, FNP  doxycycline (VIBRAMYCIN) 100 MG capsule Take 1 capsule (100 mg total) 2 (two) times daily by mouth. 01/18/17  Yes Scot Jun, FNP  gabapentin (NEURONTIN) 100 MG capsule Take 3 capsules (300 mg total) by mouth 4 (four) times daily as needed. 06/01/16  Yes Scot Jun, FNP  HYDROcodone-acetaminophen (NORCO/VICODIN) 5-325 MG tablet Take 1 tablet by mouth every 6 (six) hours as  needed for moderate pain. 12/28/14  Yes Lawyer, Harrell Gave, PA-C  pravastatin (PRAVACHOL) 40 MG tablet Take 1 tablet (40 mg total) by mouth daily. 06/01/16  Yes Scot Jun, FNP  traMADol (ULTRAM) 50 MG tablet Take 1 tablet (50 mg total) by mouth every 6 (six) hours as needed. 06/01/16  Yes Scot Jun, FNP  venlafaxine XR (EFFEXOR XR) 75 MG 24 hr capsule Take 1 capsule (75 mg total) daily with breakfast by mouth. 01/18/17  Yes Scot Jun, FNP    Past Medical, Surgical Family and Social History reviewed and updated.    Objective:    Today's Vitals   02/08/17 0910 02/08/17 0937  BP: (!) 150/82 140/76  Pulse: 65   Resp: 16   Temp: 98.3 F (36.8 C)   TempSrc: Oral   SpO2: 100%   Weight: 162 lb 4.8 oz (73.6 kg)   Height: 5\' 4"  (1.626 m)     Wt Readings from Last 3 Encounters:  02/08/17 162 lb 4.8 oz (73.6 kg)  01/18/17 168 lb (76.2 kg)  01/11/17 167 lb (75.8 kg)    Physical Exam  Constitutional: She is oriented to person, place, and time. She appears well-developed and well-nourished.  HENT:  Head: Normocephalic and atraumatic.  Eyes: Conjunctivae are normal. Pupils are equal, round, and reactive to light.  Neck: Normal range of motion. Neck supple.  Cardiovascular: Normal rate, regular rhythm, normal heart sounds and intact distal pulses.  Pulmonary/Chest: Effort normal and breath sounds normal.  Musculoskeletal: She exhibits tenderness.  Right lower extremity lateral leg and lower right ankle   Lymphadenopathy:    She has no cervical adenopathy.  Neurological: She is alert and oriented to person, place, and time.  Skin: Skin is warm and dry.     Psychiatric: She has a normal mood and affect. Her behavior is normal. Judgment and thought content normal.   Assessment & Plan:  1. Localized skin mass, lump, or swelling, during previous visit patient was previously worked up for an autoimmune disorder which workup was all negative.  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  As masses remain tender patient was placed on extended course of doxycycline which she is currently completing.  I am referring her to dermatology for further workup and evaluation and/or possible biopsy if needed.   2. Neutropenia, unspecified type ([place]), prior CBC was significant was for leucopenia, thrombocytopenia, and neutropenia.  We will repeat a CBC with differential today.  If remains abnormal will refer to hematology for further evaluation and workup.   3. Hot flashes, improved, although symptoms  remain present. Will increase venlafaxine 150 mg once daily.   -RTC: 3 months for chronic condition. Reminded to follow-up with [facility] for follow-up abnormal PAP.   Carroll Sage. Kenton Kingfisher, MSN, FNP-C [facility]  [address] [place], [place] [address] [phone]

## 2017-02-08 NOTE — Patient Instructions (Signed)
Leukopenia Leukopenia is a condition in which you have a low number of white blood cells. White blood cells help the body to fight infections. The number of white blood cells in the body varies from person to person. There are five types of white blood cells. Two types (lymphocytes and neutrophils) make up most of the white blood cell count. When lymphocytes are low, the condition is called lymphocytopenia. When neutrophils are low, it is called neutropenia. Neutropenia is the most dangerous type of leukopenia because it can lead to dangerous infections. What are the causes? This condition is commonly caused by damage to soft tissue inside of the bones (bone marrow), which is where most white blood cells are made. Bone marrow can get damaged by:  Medicine or X-ray treatments for cancer (chemotherapy or radiation therapy).  Serious infections.  Cancer of the white blood cells (leukemia, lymphoma, or myeloma).  Medicines, including: ? Certain antibiotics. ? Certain heart medicines. ? Steroids. ? Certain medicines used to treat diseases of the immune system (autoimmune diseases), like rheumatoid arthritis.  Leukopenia also happens when white blood cells are destroyed after leaving the bone marrow, which may result from:  Liver disease.  Autoimmune disease.  Vitamin B deficiencies.  What are the signs or symptoms? One of the most common signs of leukopenia, especially severe neutropenia, is having a lot of bacterial infections. Different infections have different symptoms. An infection in your lungs may cause coughing. A urinary tract infection may cause frequent urination and a burning sensation. You may also get infections of the blood, skin, rectum, throat, sinuses, or ears. Some people have no symptoms. If you do have symptoms, they may include:  Fever.  Fatigue.  Swollen glands (lymph nodes).  Painful mouth ulcers.  Gum disease.  How is this diagnosed? This condition may be  diagnosed based on:  Your medical history.  A physical exam to check for swollen lymph nodes and an enlarged spleen. Your spleen is an organ on the left side of your body that stores white blood cells.  Tests, such as: ? A complete blood count. This blood test counts each type of white cell. ? Bone marrow aspiration. Some bone marrow is removed to be checked under a microscope. ? Lymph node biopsy. Some lymph node tissue is removed to be checked under a microscope. ? Other types of blood tests or imaging tests.  How is this treated? Treatment of leukopenia depends on the cause. Some common treatments include:  Antibiotic medicine to treat bacterial infections.  Stopping medicines that may cause leukopenia.  Medicines to stimulate neutrophil production (hematopoietic growth factors), to treat neutropenia.  Follow these instructions at home:  Take over-the-counter and prescription medicines only as told by your health care provider. This includes supplements and vitamins.  If you were prescribed an antibiotic medicine, take it as told by your health care provider. Do not stop taking the antibiotic even if you start to feel better.  Preventing infection is important if you have leukopenia. To prevent infection: ? Avoid close contact with sick people. ? Wash your hands frequently with soap and water. If soap and water are not available, use hand sanitizer. ? Do not&nbsp;eat uncooked or undercooked meats. ? Wash fruits and vegetables before eating them. ? Do not eat or drink unpasteurized dairy products. ? Get regular dental care, and maintain good dental hygiene. You should visit the dentist at least once every 6 months.  Keep all follow-up visits as told by your health care   provider. This is important. Contact a health care provider if:  You have chills or a fever.  You have symptoms of an infection. Get help right away if:  You have a fever that lasts for more than 2-3  days.  You have symptoms that last for more than 2-3 days.  You have trouble breathing.  You have chest pain. This information is not intended to replace advice given to you by your health care provider. Make sure you discuss any questions you have with your health care provider. Document Released: 02/28/2013 Document Revised: 01/14/2016 Document Reviewed: 01/14/2016 Elsevier Interactive Patient Education  2018 Elsevier Inc.  

## 2017-02-09 LAB — CBC WITH DIFFERENTIAL/PLATELET
Basophils Absolute: 0 10*3/uL (ref 0.0–0.2)
Basos: 0 %
EOS (ABSOLUTE): 0 10*3/uL (ref 0.0–0.4)
Eos: 1 %
Hematocrit: 42.3 % (ref 34.0–46.6)
Hemoglobin: 13.4 g/dL (ref 11.1–15.9)
Immature Grans (Abs): 0 10*3/uL (ref 0.0–0.1)
Immature Granulocytes: 0 %
Lymphocytes Absolute: 1.5 10*3/uL (ref 0.7–3.1)
Lymphs: 31 %
MCH: 30 pg (ref 26.6–33.0)
MCHC: 31.7 g/dL (ref 31.5–35.7)
MCV: 95 fL (ref 79–97)
Monocytes Absolute: 0.3 10*3/uL (ref 0.1–0.9)
Monocytes: 5 %
Neutrophils Absolute: 3.1 10*3/uL (ref 1.4–7.0)
Neutrophils: 63 %
Platelets: 227 10*3/uL (ref 150–379)
RBC: 4.46 x10E6/uL (ref 3.77–5.28)
RDW: 13.8 % (ref 12.3–15.4)
WBC: 4.9 10*3/uL (ref 3.4–10.8)

## 2017-02-11 ENCOUNTER — Telehealth: Payer: Self-pay | Admitting: Family Medicine

## 2017-02-11 NOTE — Telephone Encounter (Signed)
Referral was sent to [facility] because they accept [place] assistance and [facility] dermatology only takes [place] card.

## 2017-02-11 NOTE — Telephone Encounter (Signed)
Complete referral for dermatology to [facility]. Patient has [employer].

## 2017-05-31 ENCOUNTER — Ambulatory Visit: Payer: Self-pay | Attending: Family Medicine

## 2017-06-01 ENCOUNTER — Ambulatory Visit: Payer: Self-pay | Admitting: Family Medicine

## 2017-06-07 ENCOUNTER — Ambulatory Visit (INDEPENDENT_AMBULATORY_CARE_PROVIDER_SITE_OTHER): Payer: Self-pay | Admitting: Family Medicine

## 2017-06-07 ENCOUNTER — Encounter: Payer: Self-pay | Admitting: Family Medicine

## 2017-06-07 VITALS — BP 140/86 | HR 68 | Temp 98.0°F | Ht 64.0 in | Wt 160.0 lb

## 2017-06-07 DIAGNOSIS — M5010 Cervical disc disorder with radiculopathy, unspecified cervical region: Secondary | ICD-10-CM

## 2017-06-07 DIAGNOSIS — E782 Mixed hyperlipidemia: Secondary | ICD-10-CM

## 2017-06-07 DIAGNOSIS — M5416 Radiculopathy, lumbar region: Secondary | ICD-10-CM

## 2017-06-07 DIAGNOSIS — I10 Essential (primary) hypertension: Secondary | ICD-10-CM

## 2017-06-07 DIAGNOSIS — Z131 Encounter for screening for diabetes mellitus: Secondary | ICD-10-CM

## 2017-06-07 LAB — POCT URINALYSIS DIP (DEVICE)
Bilirubin Urine: NEGATIVE
Glucose, UA: NEGATIVE mg/dL
Ketones, ur: NEGATIVE mg/dL
Leukocytes, UA: NEGATIVE
Nitrite: NEGATIVE
Protein, ur: NEGATIVE mg/dL
Specific Gravity, Urine: >=1.03 (ref 1.005–1.030)
Urobilinogen, UA: 1 mg/dL (ref 0.0–1.0)
pH: 6 (ref 5.0–8.0)

## 2017-06-07 LAB — POCT GLYCOSYLATED HEMOGLOBIN (HGB A1C): Hemoglobin A1C: 5.1

## 2017-06-07 MED ORDER — GABAPENTIN 300 MG PO CAPS: 300.0000 mg | ORAL_CAPSULE | Freq: Four times a day (QID) | ORAL | 3 refills | Status: DC

## 2017-06-07 MED ORDER — CYCLOBENZAPRINE HCL 10 MG PO TABS: 10.0000 mg | ORAL_TABLET | Freq: Three times a day (TID) | ORAL | 0 refills | Status: DC | PRN

## 2017-06-07 MED ORDER — AMLODIPINE BESYLATE 5 MG PO TABS: 5.0000 mg | ORAL_TABLET | Freq: Every day | ORAL | 3 refills | Status: DC

## 2017-06-07 MED ORDER — TRAMADOL HCL 50 MG PO TABS: 50.0000 mg | ORAL_TABLET | Freq: Four times a day (QID) | ORAL | 0 refills | Status: DC | PRN

## 2017-06-07 MED FILL — AMLODIPINE BESYLATE 5 MG TA: 5 | 30 days supply | Qty: 30 | Fill #0

## 2017-06-07 MED FILL — GABAPENTIN 300 MG CAPSULE: 300 | 22 days supply | Qty: 90 | Fill #0

## 2017-06-07 MED FILL — CYCLOBENZAPRINE 10 MG TAB: 10 | 30 days supply | Qty: 90 | Fill #0

## 2017-06-07 NOTE — Progress Notes (Signed)
Patient ID: [name], female    DOB: Nov 30, 1960, 57 y.o.   MRN: [id number]  PCP: Krista Jun, FNP  Chief Complaint  Patient presents with  . Follow-up    3 month on chronic condition    Subjective:  HPI [name] is a 57 y.o. female with hypertension, hx of abnormal PAP, chronic cervical and lumbar radiculopathy,  presents for chronic condition follow-up. [name] complains of worsening chronic back pain. She was last seen by orthopedics over 1 year ago and she hasn't returned as her financial assistance expired. Financial assistance has since renewed. She is prescribed tramadol and gabapentin for pain which she inconsistently takes the Gabapentin. Tramadol is not working. She suffers from hypertension and has not taken her medication today. She denies shortness of breath, chest pain, weakness, or dizziness. She complains of occasional edema which resolves with rest. She continues to smoke, although notes 1 pack of cigarettes will last up to 2 weeks. She is fasting today and will obtain a lipid panel. Social History   Socioeconomic History  . Marital status: Married    Spouse name: Not on file  . Number of children: Not on file  . Years of education: Not on file  . Highest education level: Not on file  Occupational History  . Not on file  Social Needs  . Financial resource strain: Not on file  . Food insecurity:    Worry: Not on file    Inability: Not on file  . Transportation needs:    Medical: Not on file    Non-medical: Not on file  Tobacco Use  . Smoking status: Current Every Day Smoker    Packs/day: 0.50    Years: 38.00    Pack years: 19.00  . Smokeless tobacco: Never Used  Substance and Sexual Activity  . Alcohol use: No    Comment:    . Drug use: No  . Sexual activity: Yes    Birth control/protection: Surgical  Lifestyle  . Physical activity:    Days per week: Not on file    Minutes per session: Not on file  . Stress: Not on file  Relationships   . Social connections:    Talks on phone: Not on file    Gets together: Not on file    Attends religious service: Not on file    Active member of club or organization: Not on file    Attends meetings of clubs or organizations: Not on file    Relationship status: Not on file  . Intimate partner violence:    Fear of current or ex partner: Not on file    Emotionally abused: Not on file    Physically abused: Not on file    Forced sexual activity: Not on file  Other Topics Concern  . Not on file  Social History Narrative  . Not on file    Family History  Problem Relation Age of Onset  . Heart failure Mother   . Hypertension Mother   . Breast cancer Sister   . Diabetes Sister    Review of Systems  Pertinent negatives listed in HPI   Patient Active Problem List   Diagnosis Date Noted  . Cervical disc disorder with radiculopathy of cervical region 01/15/2016  . Lumbar back pain with radiculopathy affecting right lower extremity 12/25/2015  . VTE (venous thromboembolism) 04/15/2012  . Leiomyoma 04/08/2012    No Known Allergies  Prior to Admission medications   Medication Sig Start Date  End Date Taking? Authorizing Provider  amLODipine (NORVASC) 5 MG tablet Take 1 tablet (5 mg total) by mouth daily. 10/12/16  Yes Krista Jun, FNP  cyclobenzaprine (FLEXERIL) 10 MG tablet Take 0.5 tablets (5 mg total) by mouth 3 (three) times daily as needed for muscle spasms. 06/01/16  Yes Krista Jun, FNP  gabapentin (NEURONTIN) 100 MG capsule Take 3 capsules (300 mg total) by mouth 4 (four) times daily as needed. 06/01/16  Yes Krista Jun, FNP  HYDROcodone-acetaminophen (NORCO/VICODIN) 5-325 MG tablet Take 1 tablet by mouth every 6 (six) hours as needed for moderate pain. 12/28/14  Yes Lawyer, Harrell Gave, PA-C  pravastatin (PRAVACHOL) 40 MG tablet Take 1 tablet (40 mg total) by mouth daily. 06/01/16  Yes Krista Jun, FNP  traMADol (ULTRAM) 50 MG tablet Take 1 tablet (50 mg  total) by mouth every 6 (six) hours as needed. 06/01/16  Yes Krista Jun, FNP  venlafaxine XR (EFFEXOR XR) 75 MG 24 hr capsule Take 2 capsules (150 mg total) by mouth daily with breakfast. 02/08/17  Yes Krista Jun, FNP   Past Medical, Surgical Family and Social History reviewed and updated.   Objective:   Today's Vitals   06/07/17 0839  BP: 140/86  Pulse: 68  Temp: 98 F (36.7 C)  TempSrc: Oral  SpO2: 100%  Weight: 160 lb (72.6 kg)  Height: 5\' 4"  (1.626 m)    Wt Readings from Last 3 Encounters:  06/07/17 160 lb (72.6 kg)  02/08/17 162 lb 4.8 oz (73.6 kg)  01/18/17 168 lb (76.2 kg)   Physical Exam Constitutional: Patient appears well-developed and well-nourished. No distress. HENT: Normocephalic, atraumatic, External right and left ear normal. Oropharynx is clear and moist.  Eyes: Conjunctivae and EOM are normal. PERRLA, no scleral icterus. Neck: Normal ROM. Neck supple. No JVD. No tracheal deviation. No thyromegaly. CVS: RRR, S1/S2 +, no murmurs, no gallops, no carotid bruit.  Pulmonary: Effort and breath sounds normal, no stridor, rhonchi, wheezes, rales.  Abdominal: Soft. BS +, no distension, tenderness, rebound or guarding.  Musculoskeletal: Normal range of motion. No edema and no tenderness.  Lymphadenopathy: No lymphadenopathy noted, cervical, inguinal or axillary Neuro: Alert. Normal reflexes, muscle tone coordination. No cranial nerve deficit. Skin: Skin is warm and dry. No rash noted. Not diaphoretic. No erythema. No pallor. Psychiatric: Normal mood and affect. Behavior, judgment, thought content normal.   Assessment & Plan:  1. Lumbar back pain with radiculopathy affecting right lower extremity, Chronic, not new. 2. Cervical disc disorder with radiculopathy of cervical region Referring patient back to [facility] [employer] [facility] for second opinion. Patient counseled that her condition is chronic and treatment at this point is palliative until she obtains  insurance to be evaluated by neurosurgeon for possible surgery. Patient verbalized understanding. I am increasing traMADol (ULTRAM) 50 MG tablet; Take 2 tablets (100 mg total) by mouth every 6 (six) hours as needed for moderate pain or severe pain and Gabapentin (NEURONTIN) 300 MG capsule; Take 1 capsule (300 mg total) by mouth 4 (four) times daily. Cyclobenzaprine 10 mg up to 3 times daily as needed. Ambulatory referral to Orthopedic Surgery and Vitamin D 1,25 dihydroxy  3. Screening for diabetes mellitus, A1C 5.1, normal. Recheck in 12 months.   4. Hypertension, elevated today, although she has not taken medication. Return in 2 weeks for a BP check. We have discussed target BP range and blood pressure goal. I have advised patient to check BP regularly and to call us back or report  to clinic if the numbers are consistently higher than 140/90. We discussed the importance of compliance with medical therapy and DASH diet recommended, consequences of uncontrolled hypertension discussed.cContinue current BP medications  5. Hyperlipidemia, continue Pravastatin 40 mg once daily for now and increase dose to Pravastatin 80 mg once daily if lipid panel remains abnormal. Encouraged smoking cessation. The 10-year ASCVD risk score Mikey Bussing DC Brooke Bonito., et al., 2013) is: 18.5%   Values used to calculate the score:     Age: 69 years     Sex: Female     Is Non-Hispanic African American: Yes     Diabetic: No     Tobacco smoker: Yes     Systolic Blood Pressure: 629 mmHg     Is BP treated: Yes     HDL Cholesterol: 33 mg/dL     Total Cholesterol: 171 mg/dL   Meds ordered this encounter  Medications  . gabapentin (NEURONTIN) 300 MG capsule    Sig: Take 1 capsule (300 mg total) by mouth 4 (four) times daily.    Dispense:  90 capsule    Refill:  3  . amLODipine (NORVASC) 5 MG tablet    Sig: Take 1 tablet (5 mg total) by mouth daily.    Dispense:  90 tablet    Refill:  3    Order Specific Question:   Supervising  Provider    Answer:   Tresa Garter [id number]  . cyclobenzaprine (FLEXERIL) 10 MG tablet    Sig: Take 1 tablet (10 mg total) by mouth 3 (three) times daily as needed for muscle spasms.    Dispense:  90 tablet    Refill:  0    Order Specific Question:   Supervising Provider    Answer:   Tresa Garter [id number]  . traMADol (ULTRAM) 50 MG tablet    Sig: Take 1-2 tablets (50-100 mg total) by mouth every 6 (six) hours as needed for moderate pain.    Dispense:  90 tablet    Refill:  0    Order Specific Question:   Supervising Provider    Answer:   Tresa Garter [id number]   Orders Placed This Encounter  Procedures  . Vitamin D 1,25 dihydroxy  . Lipid panel  . Ambulatory referral to Orthopedic Surgery  . POCT urinalysis dip (device)  . HgB A1c    RTC: 6 months for chronic conditions and 2 week BP check   Carroll Sage. Kenton Kingfisher, MSN, FNP-C [facility]  [address] [place], [place] [address] [phone]

## 2017-06-07 NOTE — Patient Instructions (Addendum)
I am increasing your tramadol to 100 mg every 6hrs as needed for pain, I want you to also take 500 mg tylenol when you take the tramadol  For muscle spasm , I have increased cyclobenzaprine 10 mg up to 3 times daily as needed for muscle spasms.    I am increasing your gabapentin to 300mg  4 times daily.   I am referring you back to orthopedic surgery for increasing pain.  Lumbosacral Radiculopathy Lumbosacral radiculopathy is a condition that involves the spinal nerves and nerve roots in the low back and bottom of the spine. The condition develops when these nerves and nerve roots move out of place or become inflamed and cause symptoms. What are the causes? This condition may be caused by:  Pressure from a disk that bulges out of place (herniated disk). A disk is a plate of cartilage that separates bones in the spine.  Disk degeneration.  A narrowing of the bones of the lower back (spinal stenosis).  A tumor.  An infection.  An injury that places sudden pressure on the disks that cushion the bones of your lower spine.  What increases the risk? This condition is more likely to develop in:  Males aged 57-50 years.  Females aged 40-60 years.  People who lift improperly.  People who are overweight or live a sedentary lifestyle.  People who smoke.  People who perform repetitive activities that strain the spine.  What are the signs or symptoms? Symptoms of this condition include:  Pain that goes down from the back into the legs (sciatica). This is the most common symptom. The pain may be worse with sitting, coughing, or sneezing.  Pain and numbness in the arms and legs.  Muscle weakness.  Tingling.  Loss of bladder control or bowel control.  How is this diagnosed? This condition is diagnosed with a physical exam and medical history. If the pain is lasting, you may have tests, such as:  MRI scan.  X-ray.  CT scan.  Myelogram.  Nerve conduction study.  How  is this treated? This condition is often treated with:  Hot packs and ice applied to affected areas.  Stretches to improve flexibility.  Exercises to strengthen back muscles.  Physical therapy.  Pain medicine.  A steroid injection in the spine.  In some cases, no treatment is needed. If the condition is long-lasting (chronic), or if symptoms are severe, treatment may involve surgery or lifestyle changes, such as following a weight loss plan. Follow these instructions at home: Medicines  Take medicines only as directed by your health care provider.  Do not drive or operate heavy machinery while taking pain medicine. Injury care  Apply a heat pack to the injured area as directed by your health care provider.  Apply ice to the affected area: ? Put ice in a plastic bag. ? Place a towel between your skin and the bag. ? Leave the ice on for 20-30 minutes, every 2 hours while you are awake or as needed. Or, leave the ice on for as long as directed by your health care provider. Other Instructions  If you were shown how to do any exercises or stretches, do them as directed by your health care provider.  If your health care provider prescribed a diet or exercise program, follow it as directed.  Keep all follow-up visits as directed by your health care provider. This is important. Contact a health care provider if:  Your pain does not improve over time even when  taking pain medicines. Get help right away if:  Your develop severe pain.  Your pain suddenly gets worse.  You develop increasing weakness in your legs.  You lose the ability to control your bladder or bowel.  You have difficulty walking or balancing.  You have a fever. This information is not intended to replace advice given to you by your health care provider. Make sure you discuss any questions you have with your health care provider. Document Released: 02/23/2005 Document Revised: 08/01/2015 Document Reviewed:  02/19/2014 Elsevier Interactive Patient Education  2018 [employer].  Cervical Radiculopathy Cervical radiculopathy means that a nerve in the neck is pinched or bruised. This can cause pain or loss of feeling (numbness) that runs from your neck to your arm and fingers. Follow these instructions at home: Managing pain  Take over-the-counter and prescription medicines only as told by your doctor.  If directed, put ice on the injured or painful area. ? Put ice in a plastic bag. ? Place a towel between your skin and the bag. ? Leave the ice on for 20 minutes, 2-3 times per day.  If ice does not help, you can try using heat. Take a warm shower or warm bath, or use a heat pack as told by your doctor.  You may try a gentle neck and shoulder massage. Activity  Rest as needed. Follow instructions from your doctor about any activities to avoid.  Do exercises as told by your doctor or physical therapist. General instructions  If you were given a soft collar, wear it as told by your doctor.  Use a flat pillow when you sleep.  Keep all follow-up visits as told by your doctor. This is important. Contact a doctor if:  Your condition does not improve with treatment. Get help right away if:  Your pain gets worse and is not controlled with medicine.  You lose feeling or feel weak in your hand, arm, face, or leg.  You have a fever.  You have a stiff neck.  You cannot control when you poop or pee (have incontinence).  You have trouble with walking, balance, or talking. This information is not intended to replace advice given to you by your health care provider. Make sure you discuss any questions you have with your health care provider. Document Released: 02/12/2011 Document Revised: 08/01/2015 Document Reviewed: 04/19/2014 Elsevier Interactive Patient Education  [employer].

## 2017-06-13 LAB — VITAMIN D 1,25 DIHYDROXY
Vitamin D 1, 25 (OH)2 Total: 54 pg/mL
Vitamin D2 1, 25 (OH)2: <10 pg/mL
Vitamin D3 1, 25 (OH)2: 53 pg/mL

## 2017-06-13 LAB — LIPID PANEL
Chol/HDL Ratio: 4.9 ratio — ABNORMAL HIGH (ref 0.0–4.4)
Cholesterol, Total: 190 mg/dL (ref 100–199)
HDL: 39 mg/dL — ABNORMAL LOW (ref >39)
LDL Calculated: 126 mg/dL — ABNORMAL HIGH (ref 0–99)
Triglycerides: 124 mg/dL (ref 0–149)
VLDL Cholesterol Cal: 25 mg/dL (ref 5–40)

## 2017-06-17 ENCOUNTER — Telehealth: Payer: Self-pay | Admitting: Family Medicine

## 2017-06-17 MED ORDER — PRAVASTATIN SODIUM 80 MG PO TABS: 80.0000 mg | ORAL_TABLET | Freq: Every day | ORAL | 11 refills | Status: DC

## 2017-06-17 NOTE — Telephone Encounter (Signed)
Recent labs indicate normal vitamin D level. Cholesterol -LDL (bad cholesterol) remains not at goal of less than 100. I am increasing pravastatin 80 mg daily at 6:00 pm. Lipid will need to be rechecked fasting at next follow-up visit.  Carroll Sage. Kenton Kingfisher, MSN, FNP-C [facility]  [address] [place], [place] [address] [phone]

## 2017-06-18 NOTE — Telephone Encounter (Signed)
Patient notified

## 2017-06-23 ENCOUNTER — Ambulatory Visit (INDEPENDENT_AMBULATORY_CARE_PROVIDER_SITE_OTHER): Payer: Self-pay | Admitting: Orthopaedic Surgery

## 2017-06-30 ENCOUNTER — Ambulatory Visit (INDEPENDENT_AMBULATORY_CARE_PROVIDER_SITE_OTHER): Payer: Self-pay | Admitting: Orthopaedic Surgery

## 2017-07-08 ENCOUNTER — Telehealth: Payer: Self-pay

## 2017-07-08 NOTE — Telephone Encounter (Signed)
Called and advised that dental practice is undergoing renovations and we will send when this is complete.

## 2017-07-14 ENCOUNTER — Other Ambulatory Visit: Payer: Self-pay | Admitting: Family Medicine

## 2017-07-14 MED FILL — CYCLOBENZAPRINE 10 MG TAB: 10 | 30 days supply | Qty: 90 | Fill #0

## 2017-07-14 MED FILL — AMLODIPINE BESYLATE 5 MG TA: 5 | 30 days supply | Qty: 30 | Fill #1

## 2017-07-20 ENCOUNTER — Encounter (INDEPENDENT_AMBULATORY_CARE_PROVIDER_SITE_OTHER): Payer: Self-pay | Admitting: Orthopaedic Surgery

## 2017-07-20 ENCOUNTER — Ambulatory Visit (INDEPENDENT_AMBULATORY_CARE_PROVIDER_SITE_OTHER): Payer: Self-pay

## 2017-07-20 ENCOUNTER — Ambulatory Visit (INDEPENDENT_AMBULATORY_CARE_PROVIDER_SITE_OTHER): Payer: Self-pay | Admitting: Orthopaedic Surgery

## 2017-07-20 ENCOUNTER — Other Ambulatory Visit (INDEPENDENT_AMBULATORY_CARE_PROVIDER_SITE_OTHER): Payer: Self-pay

## 2017-07-20 VITALS — BP 157/85 | HR 66 | Ht 64.0 in | Wt 155.0 lb

## 2017-07-20 DIAGNOSIS — G8929 Other chronic pain: Secondary | ICD-10-CM

## 2017-07-20 DIAGNOSIS — M5441 Lumbago with sciatica, right side: Secondary | ICD-10-CM

## 2017-07-20 DIAGNOSIS — M4807 Spinal stenosis, lumbosacral region: Secondary | ICD-10-CM

## 2017-07-20 NOTE — Progress Notes (Signed)
Office Visit Note/orthopedic consultation requested by [facility]   Patient: [name]           Date of Birth: 06-16-1960           MRN: [id number] Visit Date: 07/20/2017              Requested by: Scot Jun, [place], [place] [address] PCP: Scot Jun, FNP   Assessment & Plan: Visit Diagnoses:  1. Chronic right-sided low back pain with right-sided sciatica     Plan: MRI scan 2 years ago showed degenerative changes L5-S1 with endplate edema.  She had some narrowing.  Anterolisthesis at L4-5 with foraminal narrowing.  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  Will obtain a new MRI scan follow-up after the scan.  Thank you for the opportunity to see her in consultation.  Follow-Up Instructions: No follow-ups on file.   Orders:  Orders Placed This Encounter  Procedures  . XR Lumbar Spine 2-3 Views   No orders of the defined types were placed in this encounter.     Procedures: No procedures performed   Clinical Data: No additional findings.   Subjective: Chief Complaint  Patient presents with  . Lower Back - Pain    HPI 57 year old female returns with ongoing chronic back pain.  She states is been worse the last few months but present for more than 2 years.  She has not worked in more than 2 years she worked for [employer] for 30+ years.  Pain radiates down the right leg more than left leg.  She is taken Neurontin and prednisone pack without relief.  No chills or fever.  No associated bowel or bladder symptoms.  Patient is applied for disability turndown x2 and has an attorney helping.  Review of Systems positive for DVT left popliteal vein 2014 with normal Doppler studies in 2016, 17 and 2018.  History of cervical disc degeneration.  Patient's half pack per day smoker.   Objective: Vital Signs: BP (!) 157/85   Pulse 66   Ht 5\' 4"  (1.626 m)   Wt 155 lb (70.3 kg)   LMP 06/13/2011   BMI 26.61 kg/m   Physical Exam    Constitutional: She is oriented to person, place, and time. She appears well-developed.  HENT:  Head: Normocephalic.  Right Ear: External ear normal.  Left Ear: External ear normal.  Patient is missing some teeth  Eyes: Pupils are equal, round, and reactive to light.  Neck: No tracheal deviation present. No thyromegaly present.  Cardiovascular: Normal rate.  Pulmonary/Chest: Effort normal.  Abdominal: Soft.  Neurological: She is alert and oriented to person, place, and time.  Skin: Skin is warm and dry.  Psychiatric: She has a normal mood and affect. Her behavior is normal.    Ortho Exam patient complains of pain with static palpation.  Pain with straight leg raising 80 degrees on the right negative on the left.  Anterior tib gastrocsoleus is active.  She ambulates with a cane.  Negative [place]oman.  No lower extremity swelling distal pulses are palpable.  Anterior tib gastrocsoleus is strong.  No sciatic notch tenderness on the left.  Trochanteric tenderness more on the right than left.  No sensory deficit.  Specialty Comments:  No specialty comments available.  Imaging:  On: 01/28/2016 09:05    PMFS History: Patient Active Problem List   Diagnosis Date Noted  . Cervical disc disorder with radiculopathy of cervical region 01/15/2016  . Lumbar  back pain with radiculopathy affecting right lower extremity 12/25/2015  . VTE (venous thromboembolism) 04/15/2012  . Leiomyoma 04/08/2012   Past Medical History:  Diagnosis Date  . DVT (deep venous thrombosis) ([place])   . Headache(784.0)    otc meds prn   . SVD (spontaneous vaginal delivery)    x 5    Family History  Problem Relation Age of Onset  . Heart failure Mother   . Hypertension Mother   . Breast cancer Sister   . Diabetes Sister     Past Surgical History:  Procedure Laterality Date  . ABDOMINAL HYSTERECTOMY  04/06/2012   Procedure: HYSTERECTOMY ABDOMINAL;  Surgeon: Margarette Asal, MD;  Location: [facility];  Service:  Gynecology;  Laterality: N/A;  . BREAST SURGERY     benign turmor removed  . SALPINGOOPHORECTOMY  04/06/2012   Procedure: SALPINGO OOPHORECTOMY;  Surgeon: Margarette Asal, MD;  Location: [facility];  Service: Gynecology;  Laterality: Bilateral;  . TUBAL LIGATION     Social History   Occupational History  . Not on file  Tobacco Use  . Smoking status: Current Every Day Smoker    Packs/day: 0.50    Years: 38.00    Pack years: 19.00  . Smokeless tobacco: Never Used  Substance and Sexual Activity  . Alcohol use: No    Comment:    . Drug use: No  . Sexual activity: Yes    Birth control/protection: Surgical

## 2017-07-21 ENCOUNTER — Encounter (INDEPENDENT_AMBULATORY_CARE_PROVIDER_SITE_OTHER): Payer: Self-pay | Admitting: Orthopaedic Surgery

## 2017-07-26 ENCOUNTER — Telehealth (INDEPENDENT_AMBULATORY_CARE_PROVIDER_SITE_OTHER): Payer: Self-pay | Admitting: *Deleted

## 2017-07-26 NOTE — Telephone Encounter (Signed)
Pt is scheduled for MRI on Friday May 24 at 12:00p with arrival at 11:30am at [facility], IC left message on vm for pt to return call for appt information. Pt will also need to schedule follow up with [facility]

## 2017-07-28 NOTE — Telephone Encounter (Signed)
  IC LEFT MESSAGE OVM TO RETURN CALL FOR APPT INFORMATION

## 2017-07-29 NOTE — Telephone Encounter (Signed)
Pt called back aware of appt 

## 2017-07-30 ENCOUNTER — Ambulatory Visit (HOSPITAL_COMMUNITY)
Admission: RE | Admit: 2017-07-30 | Discharge: 2017-07-30 | Disposition: A | Discharge status: Home / Self Care | Payer: Self-pay | Source: Ambulatory Visit | Attending: Orthopaedic Surgery | Admitting: Orthopaedic Surgery

## 2017-07-30 DIAGNOSIS — M4316 Spondylolisthesis, lumbar region: Secondary | ICD-10-CM | POA: Insufficient documentation

## 2017-07-30 DIAGNOSIS — M4807 Spinal stenosis, lumbosacral region: Secondary | ICD-10-CM | POA: Insufficient documentation

## 2017-07-30 DIAGNOSIS — M2578 Osteophyte, vertebrae: Secondary | ICD-10-CM | POA: Insufficient documentation

## 2017-07-30 DIAGNOSIS — M48061 Spinal stenosis, lumbar region without neurogenic claudication: Secondary | ICD-10-CM | POA: Insufficient documentation

## 2017-08-09 ENCOUNTER — Ambulatory Visit: Payer: Self-pay

## 2017-08-09 ENCOUNTER — Other Ambulatory Visit: Payer: Self-pay | Admitting: Family Medicine

## 2017-08-09 VITALS — BP 150/82

## 2017-08-09 DIAGNOSIS — I10 Essential (primary) hypertension: Secondary | ICD-10-CM

## 2017-08-09 MED ORDER — AMLODIPINE BESYLATE 10 MG PO TABS: 10.0000 mg | ORAL_TABLET | Freq: Every day | ORAL | 5 refills | Status: DC

## 2017-08-09 NOTE — Progress Notes (Signed)
Meds ordered this encounter  Medications  . amLODipine (NORVASC) 10 MG tablet    Sig: Take 1 tablet (10 mg total) by mouth daily.    Dispense:  30 tablet    Refill:  [place]  MSN, FNP-C Patient [place] [address] [place],  [address] [phone]

## 2017-08-10 ENCOUNTER — Ambulatory Visit (INDEPENDENT_AMBULATORY_CARE_PROVIDER_SITE_OTHER): Payer: Self-pay | Admitting: Orthopaedic Surgery

## 2017-08-10 ENCOUNTER — Encounter (INDEPENDENT_AMBULATORY_CARE_PROVIDER_SITE_OTHER): Payer: Self-pay | Admitting: Orthopaedic Surgery

## 2017-08-10 VITALS — BP 152/90 | HR 66 | Ht 65.0 in | Wt 159.0 lb

## 2017-08-10 DIAGNOSIS — M4807 Spinal stenosis, lumbosacral region: Secondary | ICD-10-CM

## 2017-08-10 NOTE — Progress Notes (Signed)
Office Visit Note   Patient: [name]           Date of Birth: [birth year]           MRN: [id number] Visit Date: 08/10/2017              Requested by: Scot Jun, [place], [place] [address] PCP: Azzie Glatter, FNP   Assessment & Plan: Visit Diagnoses: Lumbar degenerative disc with some right foraminal narrowing L5-S1.  L4-5 grade 1 anterolisthesis, stable.  Plan: MRI images and report are reviewed with patient I gave her a copy of the report.  She has degenerative changes at L5-S1 with some foraminal narrowing which is stable comparison to previous MRI scan.  Grade 1 anterolisthesis at L4-5.  We will set her up for a trial of an epidural injection her right leg symptoms in office follow-up in 2 months.  Pathophysiology discussed.  Follow-Up Instructions: No follow-ups on file.   Orders:  No orders of the defined types were placed in this encounter.  No orders of the defined types were placed in this encounter.     Procedures: No procedures performed   Clinical Data: No additional findings.   Subjective: Chief Complaint  Patient presents with  . Lower Back - Pain, Follow-up    MRI review    HPI 58 year old female returns with ongoing chronic back pain.  She has pain that radiates down the right side.  She has not worked in several years she is been on prednisone in the past she still takes Neurontin.  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  She has attorney helping her with the disability application.  MRI scan has been obtained due to her ongoing back pain is available for review.  She denies bowel or bladder symptoms she has increased symptoms with prolonged standing and walking in her right leg.  Review of Systems 14 point update unchanged from 07/20/2017.  Of note his past history of popliteal DVT.  Negative studies 2016, 17 and 18.  Cervical disc degeneration.  1/2 pack/day smoker.   Objective: Vital Signs: BP (!) 152/90   Pulse 66    Ht 5\' 5"  (1.651 m)   Wt 159 lb (72.1 kg)   LMP 06/13/2011   BMI 26.46 kg/m   Physical Exam  Constitutional: She is oriented to person, place, and time. She appears well-developed.  HENT:  Head: Normocephalic.  Right Ear: External ear normal.  Left Ear: External ear normal.  Eyes: Pupils are equal, round, and reactive to light.  Neck: No tracheal deviation present. No thyromegaly present.  Cardiovascular: Normal rate.  Pulmonary/Chest: Effort normal.  Abdominal: Soft.  Neurological: She is alert and oriented to person, place, and time.  Skin: Skin is warm and dry.  Psychiatric: She has a normal mood and affect. Her behavior is normal.    Ortho Exam patient ambulate anterior tib gastrocsoleus is strong she has palpable pulses.  She has pain with straight leg raising on the right at 80 degrees.  Negative straight leg raising on the left.  Negative popliteal compression test.  Normal hip range of motion.  Corky Sox test is negative.  She has some tenderness of the greater trochanter on the right. Specialty Comments:  No specialty comments available.  Imaging:CLINICAL DATA:  57 y/o F; chronic lower back pain with radiculopathy down the right leg. Rule out spinal stenosis.  EXAM: MRI LUMBAR SPINE WITHOUT CONTRAST  TECHNIQUE: Multiplanar, multisequence MR  imaging of the lumbar spine was performed. No intravenous contrast was administered.  COMPARISON:  07/20/2017 lumbar spine radiographs. 01/28/2016 lumbar spine MRI.  FINDINGS: Segmentation:  Standard.  Alignment:  Stable grade 1 L4-5 anterolisthesis.  Vertebrae:  No fracture, evidence of discitis, or bone lesion.  Conus medullaris and cauda equina: Conus extends to the L1-2 level. Conus and cauda equina appear normal.  Paraspinal and other soft tissues: Negative.  Disc levels:  L1-2: No significant disc displacement, foraminal stenosis, or canal stenosis.  L2-3: No significant disc displacement, foraminal  stenosis, or canal stenosis.  L3-4: Stable mild disc bulge. No significant foraminal or canal stenosis.  L4-5: Stable T1 anterolisthesis, small uncovered disc bulge, central annular fissure, moderate facet, and mild ligamentum flavum hypertrophy. Moderate right and mild left foraminal stenosis. Mild canal stenosis.  L5-S1: Stable small disc bulge, endplate marginal osteophytes, and facet hypertrophy. Moderate bilateral foraminal stenosis. Disc osteophyte contact on the exiting L5 nerve roots in the extraforaminal zones, greater on the right.  IMPRESSION: 1. Stable lumbar spine degenerative changes greatest at the L5-S1 level were disc and facet disease results in moderate bilateral foraminal stenosis and there is disc osteophyte contact on exiting L5 nerve roots in right-greater-than-left extraforaminal zones. 2. Stable L4-5 grade 1 anterolisthesis. 3. No acute osseous abnormality identified.   Electronically Signed   By: Kristine Garbe M.D.   On: 07/30/2017 13:46    PMFS History: Patient Active Problem List   Diagnosis Date Noted  . Cervical disc disorder with radiculopathy of cervical region 01/15/2016  . Lumbar back pain with radiculopathy affecting right lower extremity 12/25/2015  . VTE (venous thromboembolism) 04/15/2012  . Leiomyoma 04/08/2012   Past Medical History:  Diagnosis Date  . DVT (deep venous thrombosis) ([place])   . Headache(784.0)    otc meds prn   . SVD (spontaneous vaginal delivery)    x 5    Family History  Problem Relation Age of Onset  . Heart failure Mother   . Hypertension Mother   . Breast cancer Sister   . Diabetes Sister     Past Surgical History:  Procedure Laterality Date  . ABDOMINAL HYSTERECTOMY  04/06/2012   Procedure: HYSTERECTOMY ABDOMINAL;  Surgeon: Margarette Asal, MD;  Location: [facility];  Service: Gynecology;  Laterality: N/A;  . BREAST SURGERY     benign turmor removed  . SALPINGOOPHORECTOMY  04/06/2012    Procedure: SALPINGO OOPHORECTOMY;  Surgeon: Margarette Asal, MD;  Location: [facility];  Service: Gynecology;  Laterality: Bilateral;  . TUBAL LIGATION     Social History   Occupational History  . Not on file  Tobacco Use  . Smoking status: Current Every Day Smoker    Packs/day: 0.50    Years: 38.00    Pack years: 19.00  . Smokeless tobacco: Never Used  Substance and Sexual Activity  . Alcohol use: No    Comment:    . Drug use: No  . Sexual activity: Yes    Birth control/protection: Surgical

## 2017-08-11 ENCOUNTER — Encounter (INDEPENDENT_AMBULATORY_CARE_PROVIDER_SITE_OTHER): Payer: Self-pay | Admitting: Orthopaedic Surgery

## 2017-10-08 ENCOUNTER — Ambulatory Visit (INDEPENDENT_AMBULATORY_CARE_PROVIDER_SITE_OTHER): Payer: Self-pay | Admitting: Orthopaedic Surgery

## 2017-10-26 ENCOUNTER — Ambulatory Visit (INDEPENDENT_AMBULATORY_CARE_PROVIDER_SITE_OTHER): Payer: Self-pay | Admitting: Orthopaedic Surgery

## 2017-10-26 ENCOUNTER — Encounter (INDEPENDENT_AMBULATORY_CARE_PROVIDER_SITE_OTHER): Payer: Self-pay | Admitting: Orthopaedic Surgery

## 2017-10-26 VITALS — BP 133/83 | HR 75 | Ht 65.0 in | Wt 153.0 lb

## 2017-10-26 DIAGNOSIS — M545 Low back pain, unspecified: Secondary | ICD-10-CM

## 2017-10-26 NOTE — Progress Notes (Signed)
Office Visit Note   Patient: [name]           Date of Birth: 1960-11-21           MRN: [id number] Visit Date: 10/26/2017              Requested by: Azzie Glatter, [place], [place] [address] PCP: Azzie Glatter, FNP   Assessment & Plan: Visit Diagnoses:  1. Low back pain without sciatica, unspecified back pain laterality, unspecified chronicity     Plan: We discussed a walking program core strengthening, smoking sensation.  If she develops increased radicular symptoms she can return.  Follow-Up Instructions: No follow-ups on file.   Orders:  No orders of the defined types were placed in this encounter.  No orders of the defined types were placed in this encounter.     Procedures: No procedures performed   Clinical Data: No additional findings.   Subjective: Chief Complaint  Patient presents with  . Lower Back - Pain, Follow-up    HPI 57 year old female returns for follow-up of low back pain.  MRI scan 07/30/2017 lumbar showed some degenerative changes at L5-S1 with moderate foraminal narrowing right greater than left.  Stable grade 1 anterolisthesis without change from previous MRI.  No central stenosis.  We recommended epidural but she decided she did not want to have the injection.  She is applied for disability and has a hearing coming up.  She has an attorney helping her with her application.  She has not worked in several years.  She is been treated with Neurontin also prednisone.  She worked as a Quarry manager for 30 years.  No fever chills no bowel bladder symptoms.  Review of Systems 14 point review of systems updated unchanged from 08/10/2017 office visit other than as mentioned above.  Previous surgeries as listed below.   Objective: Vital Signs: BP 133/83   Pulse 75   Ht 5\' 5"  (1.651 m)   Wt 153 lb (69.4 kg)   LMP 06/13/2011   BMI 25.46 kg/m   Physical Exam  Constitutional: She is oriented to person, place, and time. She  appears well-developed.  HENT:  Head: Normocephalic.  Right Ear: External ear normal.  Left Ear: External ear normal.  Eyes: Pupils are equal, round, and reactive to light.  Neck: No tracheal deviation present. No thyromegaly present.  Cardiovascular: Normal rate.  Pulmonary/Chest: Effort normal.  Abdominal: Soft.  Neurological: She is alert and oriented to person, place, and time.  Skin: Skin is warm and dry.  Psychiatric: She has a normal mood and affect. Her behavior is normal.    Ortho Exam patient has normal pulses, she is able to heel and toe walk.  She has some discomfort with straight leg raising at 80 degrees on the right negative on the left.  No venous stasis changes no calf tenderness.  Mild tenderness over the greater trochanter on the right.  Specialty Comments:  No specialty comments available.  Imaging: No results found.   PMFS History: Patient Active Problem List   Diagnosis Date Noted  . Cervical disc disorder with radiculopathy of cervical region 01/15/2016  . Low back pain 12/25/2015  . VTE (venous thromboembolism) 04/15/2012  . Leiomyoma 04/08/2012   Past Medical History:  Diagnosis Date  . DVT (deep venous thrombosis) ([facility])   . Headache(784.0)    otc meds prn   . SVD (spontaneous vaginal delivery)    x 5  Family History  Problem Relation Age of Onset  . Heart failure Mother   . Hypertension Mother   . Breast cancer Sister   . Diabetes Sister     Past Surgical History:  Procedure Laterality Date  . ABDOMINAL HYSTERECTOMY  04/06/2012   Procedure: HYSTERECTOMY ABDOMINAL;  Surgeon: Margarette Asal, MD;  Location: [facility];  Service: Gynecology;  Laterality: N/A;  . BREAST SURGERY     benign turmor removed  . SALPINGOOPHORECTOMY  04/06/2012   Procedure: SALPINGO OOPHORECTOMY;  Surgeon: Margarette Asal, MD;  Location: [facility];  Service: Gynecology;  Laterality: Bilateral;  . TUBAL LIGATION     Social History   Occupational History  . Not on  file  Tobacco Use  . Smoking status: Current Every Day Smoker    Packs/day: 0.50    Years: 38.00    Pack years: 19.00  . Smokeless tobacco: Never Used  Substance and Sexual Activity  . Alcohol use: No    Comment:    . Drug use: No  . Sexual activity: Yes    Birth control/protection: Surgical

## 2017-10-27 ENCOUNTER — Encounter (INDEPENDENT_AMBULATORY_CARE_PROVIDER_SITE_OTHER): Payer: Self-pay | Admitting: Orthopaedic Surgery

## 2017-12-07 ENCOUNTER — Ambulatory Visit: Payer: Self-pay | Admitting: Family Medicine

## 2018-04-01 ENCOUNTER — Ambulatory Visit: Payer: Self-pay | Admitting: Family Medicine

## 2018-09-07 DIAGNOSIS — E559 Vitamin D deficiency, unspecified: Secondary | ICD-10-CM

## 2018-09-07 DIAGNOSIS — I10 Essential (primary) hypertension: Secondary | ICD-10-CM

## 2018-09-07 HISTORY — DX: Vitamin D deficiency, unspecified: E55.9

## 2018-09-07 HISTORY — DX: Essential (primary) hypertension: I10

## 2018-09-14 ENCOUNTER — Encounter: Payer: Self-pay | Admitting: Family Medicine

## 2018-09-14 ENCOUNTER — Ambulatory Visit (HOSPITAL_BASED_OUTPATIENT_CLINIC_OR_DEPARTMENT_OTHER)
Admission: RE | Admit: 2018-09-14 | Discharge: 2018-09-14 | Disposition: A | Discharge status: Home / Self Care | Payer: 59 | Source: Ambulatory Visit | Attending: Family Medicine | Admitting: Family Medicine

## 2018-09-14 ENCOUNTER — Other Ambulatory Visit: Payer: Self-pay

## 2018-09-14 ENCOUNTER — Ambulatory Visit (INDEPENDENT_AMBULATORY_CARE_PROVIDER_SITE_OTHER): Payer: 59 | Admitting: Family Medicine

## 2018-09-14 ENCOUNTER — Ambulatory Visit (HOSPITAL_COMMUNITY)
Admission: RE | Admit: 2018-09-14 | Discharge: 2018-09-14 | Disposition: A | Discharge status: Home / Self Care | Payer: 59 | Source: Ambulatory Visit | Attending: Family Medicine | Admitting: Family Medicine

## 2018-09-14 VITALS — BP 152/84 | HR 66 | Ht 65.0 in | Wt 160.6 lb

## 2018-09-14 DIAGNOSIS — M79601 Pain in right arm: Secondary | ICD-10-CM | POA: Diagnosis not present

## 2018-09-14 DIAGNOSIS — M62838 Other muscle spasm: Secondary | ICD-10-CM | POA: Insufficient documentation

## 2018-09-14 DIAGNOSIS — Z86718 Personal history of other venous thrombosis and embolism: Secondary | ICD-10-CM | POA: Insufficient documentation

## 2018-09-14 DIAGNOSIS — M79604 Pain in right leg: Secondary | ICD-10-CM

## 2018-09-14 DIAGNOSIS — R232 Flushing: Secondary | ICD-10-CM

## 2018-09-14 DIAGNOSIS — M5416 Radiculopathy, lumbar region: Secondary | ICD-10-CM

## 2018-09-14 DIAGNOSIS — Z0000 Encounter for general adult medical examination without abnormal findings: Secondary | ICD-10-CM

## 2018-09-14 DIAGNOSIS — Z131 Encounter for screening for diabetes mellitus: Secondary | ICD-10-CM | POA: Diagnosis not present

## 2018-09-14 DIAGNOSIS — R8290 Unspecified abnormal findings in urine: Secondary | ICD-10-CM

## 2018-09-14 DIAGNOSIS — Z09 Encounter for follow-up examination after completed treatment for conditions other than malignant neoplasm: Secondary | ICD-10-CM

## 2018-09-14 DIAGNOSIS — I10 Essential (primary) hypertension: Secondary | ICD-10-CM | POA: Diagnosis not present

## 2018-09-14 LAB — POCT URINALYSIS DIP (MANUAL ENTRY)
Bilirubin, UA: NEGATIVE
Glucose, UA: NEGATIVE mg/dL
Ketones, POC UA: NEGATIVE mg/dL
Leukocytes, UA: NEGATIVE
Nitrite, UA: NEGATIVE
Protein Ur, POC: 100 mg/dL — AB
Spec Grav, UA: 1.025 (ref 1.010–1.025)
Urobilinogen, UA: 1 E.U./dL
pH, UA: 6 (ref 5.0–8.0)

## 2018-09-14 LAB — POCT GLYCOSYLATED HEMOGLOBIN (HGB A1C): Hemoglobin A1C: 5.4 % (ref 4.0–5.6)

## 2018-09-14 MED ORDER — AMLODIPINE BESYLATE 10 MG PO TABS: 10.0000 mg | ORAL_TABLET | Freq: Every day | ORAL | 5 refills | Status: DC

## 2018-09-14 MED ORDER — CYCLOBENZAPRINE HCL 10 MG PO TABS: ORAL_TABLET | ORAL | 0 refills | Status: DC

## 2018-09-14 NOTE — Progress Notes (Signed)
Patient [facility]   Established Patient Office Visit  Subjective:  Patient ID: [name], female    DOB: 16-Apr-1960  Age: 58 y.o. MRN: [id number]  CC:  Chief Complaint  Patient presents with   Follow-up    HTN, chronic condition     HPI [name] is a 58 year old female who presents for follow up today.   Past Medical History:  Diagnosis Date   DVT (deep venous thrombosis) ([facility])    Headache(784.0)    otc meds prn    History of hysterectomy    Hypertension 09/2018   SVD (spontaneous vaginal delivery)    x 5   Current Status: This will be my initial visit with with Ms. Thorson. She was a previous patient of [place] Hollis, NP in our clinic. She has been lost to follow up because of insurance issues. Since her last office visit, she is doing well with no complaints. She denies visual changes, chest pain, cough, shortness of breath, heart palpitations, and falls. She has occasional headaches and dizziness with position changes. Denies severe headaches, confusion, seizures, double vision, and blurred vision, nausea and vomiting. Patient states that she has recently been approved for Disability Benefits. She denies visual changes, chest pain, cough, shortness of breath, heart palpitations, and falls. She has occasional headaches and dizziness with position changes. Denies severe headaches, confusion, seizures, double vision, and blurred vision, nausea and vomiting. Her anxiety is mild today. She denies suicidal ideations, homicidal ideations, or auditory hallucinations. She reports chronic right arm pain X 6 months. She has a history of frequent Plasma Donations. She has discontinued donations because of increase right arm pain. She also reports pain in right lower extremity X 2 weeks now. She reports swelling, and bruising in lower leg. She has right foot pain. Also chronic back and neck pain. She has a history of DVT in right lower  extremity in right lower extremity. She discontinued blood thinners on her own because of increased cost.   She denies fevers, chills, fatigue, recent infections, weight loss, and night sweats. No reports of GI problems such as diarrhea, and constipation. She has no reports of blood in stools, dysuria and hematuria. No depression or anxiety, and  She denies pain today.   Past Surgical History:  Procedure Laterality Date   ABDOMINAL HYSTERECTOMY  04/06/2012   Procedure: HYSTERECTOMY ABDOMINAL;  Surgeon: Margarette Asal, MD;  Location: [facility];  Service: Gynecology;  Laterality: N/A;   BREAST SURGERY     benign turmor removed   SALPINGOOPHORECTOMY  04/06/2012   Procedure: SALPINGO OOPHORECTOMY;  Surgeon: Margarette Asal, MD;  Location: [facility];  Service: Gynecology;  Laterality: Bilateral;   TUBAL LIGATION      Family History  Problem Relation Age of Onset   Heart failure Mother    Hypertension Mother    Breast cancer Sister    Diabetes Sister     Social History   Socioeconomic History   Marital status: Married    Spouse name: Not on file   Number of children: Not on file   Years of education: Not on file   Highest education level: Not on file  Occupational History   Not on file  Social Needs   Financial resource strain: Not on file   Food insecurity    Worry: Not on file    Inability: Not on file   Transportation needs    Medical: Not  on file    Non-medical: Not on file  Tobacco Use   Smoking status: Current Every Day Smoker    Packs/day: 0.50    Years: 38.00    Pack years: 19.00   Smokeless tobacco: Never Used  Substance and Sexual Activity   Alcohol use: No    Comment:     Drug use: No   Sexual activity: Yes    Birth control/protection: Surgical  Lifestyle   Physical activity    Days per week: Not on file    Minutes per session: Not on file   Stress: Not on file  Relationships   Social connections    Talks on phone: Not on file     Gets together: Not on file    Attends religious service: Not on file    Active member of club or organization: Not on file    Attends meetings of clubs or organizations: Not on file    Relationship status: Not on file   Intimate partner violence    Fear of current or ex partner: Not on file    Emotionally abused: Not on file    Physically abused: Not on file    Forced sexual activity: Not on file  Other Topics Concern   Not on file  Social History Narrative   Not on file    Outpatient Medications Prior to Visit  Medication Sig Dispense Refill   gabapentin (NEURONTIN) 300 MG capsule Take 1 capsule (300 mg total) by mouth 4 (four) times daily. 90 capsule 3   cyclobenzaprine (FLEXERIL) 10 MG tablet TAKE 1 TABLET BY MOUTH 3 (THREE) TIMES DAILY AS NEEDED FOR MUSCLE SPASMS. 90 tablet 0   pravastatin (PRAVACHOL) 80 MG tablet Take 1 tablet (80 mg total) by mouth daily. (Patient not taking: Reported on 09/14/2018) 30 tablet 11   amLODipine (NORVASC) 10 MG tablet Take 1 tablet (10 mg total) by mouth daily. (Patient not taking: Reported on 09/14/2018) 30 tablet 5   gabapentin (NEURONTIN) 100 MG capsule Take 3 capsules (300 mg total) by mouth 4 (four) times daily as needed. (Patient not taking: Reported on 09/14/2018) 120 capsule 2   HYDROcodone-acetaminophen (NORCO/VICODIN) 5-325 MG tablet Take 1 tablet by mouth every 6 (six) hours as needed for moderate pain. (Patient not taking: Reported on 09/14/2018) 15 tablet 0   traMADol (ULTRAM) 50 MG tablet Take 1-2 tablets (50-100 mg total) by mouth every 6 (six) hours as needed for moderate pain. (Patient not taking: Reported on 09/14/2018) 90 tablet 0   No facility-administered medications prior to visit.     No Known Allergies  ROS Review of Systems  Constitutional: Negative.   HENT: Negative.   Eyes: Negative.   Respiratory: Negative.   Cardiovascular: Positive for leg swelling (right leg).  Gastrointestinal: Negative.   Endocrine: Negative.    Genitourinary: Negative.   Musculoskeletal: Positive for arthralgias (right arm and right lower extremity pain), back pain (chronic) and neck pain (chronic).  Skin: Negative.   Allergic/Immunologic: Negative.   Neurological: Positive for dizziness (occasional ) and headaches (occasional).  Hematological: Negative.   Psychiatric/Behavioral: Negative.    Objective:    Physical Exam  Constitutional: She is oriented to person, place, and time. She appears well-developed and well-nourished.  HENT:  Head: Normocephalic and atraumatic.  Eyes: Conjunctivae are normal.  Neck: Normal range of motion. Neck supple.  Cardiovascular: Normal rate, regular rhythm, normal heart sounds and intact distal pulses.  Pulmonary/Chest: Effort normal and breath sounds normal.  Abdominal: Soft.  Bowel sounds are normal.  Musculoskeletal: Normal range of motion.  Neurological: She is alert and oriented to person, place, and time. She has normal reflexes.  Skin: Skin is warm and dry.  Psychiatric: She has a normal mood and affect. Her behavior is normal. Judgment and thought content normal.    BP (!) 152/84    Pulse 66    Ht 5\' 5"  (1.651 m)    Wt 160 lb 9.6 oz (72.8 kg)    LMP 06/13/2011    SpO2 100%    BMI 26.73 kg/m  Wt Readings from Last 3 Encounters:  09/14/18 160 lb 9.6 oz (72.8 kg)  10/26/17 153 lb (69.4 kg)  08/10/17 159 lb (72.1 kg)    Health Maintenance Due  Topic Date Due   COLONOSCOPY  03/26/2010   MAMMOGRAM  12/19/2017    There are no preventive care reminders to display for this patient.  Lab Results  Component Value Date   TSH 1.46 01/18/2017   Lab Results  Component Value Date   WBC 4.9 02/08/2017   HGB 13.4 02/08/2017   HCT 42.3 02/08/2017   MCV 95 02/08/2017   PLT 227 02/08/2017   Lab Results  Component Value Date   NA 140 01/18/2017   K 4.3 01/18/2017   CO2 19 (L) 01/18/2017   GLUCOSE 104 (H) 01/18/2017   BUN 13 01/18/2017   CREATININE 0.77 01/18/2017   BILITOT  0.3 12/02/2016   ALKPHOS 58 06/01/2016   AST 11 12/02/2016   ALT 12 12/02/2016   PROT 5.8 (L) 12/02/2016   ALBUMIN 2.9 (L) 06/01/2016   CALCIUM 9.1 01/18/2017   ANIONGAP 6 09/02/2015   Lab Results  Component Value Date   CHOL 190 06/07/2017   Lab Results  Component Value Date   HDL 39 (L) 06/07/2017   Lab Results  Component Value Date   LDLCALC 126 (H) 06/07/2017   Lab Results  Component Value Date   TRIG 124 06/07/2017   Lab Results  Component Value Date   CHOLHDL 4.9 (H) 06/07/2017   Lab Results  Component Value Date   HGBA1C 5.4 09/14/2018   Assessment & Plan:   1. Essential hypertension The current medical regimen is effective; blood pressure is stable at 152/84 today; continue present plan and medications as prescribed. She will continue to decrease high sodium intake, excessive alcohol intake, increase potassium intake, smoking cessation, and increase physical activity of at least 30 minutes of cardio activity daily. She will continue to follow Heart Healthy or DASH diet. - POCT urinalysis dipstick - amLODipine (NORVASC) 10 MG tablet; Take 1 tablet (10 mg total) by mouth daily.  Dispense: 30 tablet; Refill: 5  2. Lower extremity pain, right - VAS [place] LOWER EXTREMITY VENOUS (DVT); Future  3. Pain of right upper extremity - VAS [place] UPPER EXTREMITY VENOUS DUPLEX; Future  4. History of DVT (deep vein thrombosis)  5. Lumbar back pain with radiculopathy affecting right lower extremity Stable today. Not worsening. She will continue Gabapentin as prescribed.   6. Muscle spasms of both lower extremities - cyclobenzaprine (FLEXERIL) 10 MG tablet; TAKE 1 TABLET BY MOUTH 3 (THREE) TIMES DAILY AS NEEDED FOR MUSCLE SPASMS.  Dispense: 90 tablet; Refill: 0  7. Hot flashes Stable.   8. Screening for diabetes mellitus Hgb A1c is stable today at 5.4. She will continue to decrease foods/beverages high in sugars and carbs and follow Heart Healthy or DASH diet. Increase  physical activity to at least 30 minutes cardio  exercise daily.  - POCT glycosylated hemoglobin (Hb A1C)  9. Abnormal urinalysis Results are pending.  - Urine Culture  10. Healthcare maintenance - CBC with Differential - Comprehensive metabolic panel - Lipid Panel - TSH - Vitamin D, 25-hydroxy - Vitamin B12  11. Follow up She will follow up in 1 month.   Meds ordered this encounter  Medications   amLODipine (NORVASC) 10 MG tablet    Sig: Take 1 tablet (10 mg total) by mouth daily.    Dispense:  30 tablet    Refill:  5   cyclobenzaprine (FLEXERIL) 10 MG tablet    Sig: TAKE 1 TABLET BY MOUTH 3 (THREE) TIMES DAILY AS NEEDED FOR MUSCLE SPASMS.    Dispense:  90 tablet    Refill:  0    Orders Placed This Encounter  Procedures   Urine Culture   CBC with Differential   Comprehensive metabolic panel   Lipid Panel   TSH   Vitamin D, 25-hydroxy   Vitamin B12   POCT glycosylated hemoglobin (Hb A1C)   POCT urinalysis dipstick    Referral Orders  No referral(s) requested today    Kathe Becton,  MSN, FNP-BC Patient [place], [place] [phone]   Problem List Items Addressed This Visit    None    Visit Diagnoses    Essential hypertension    -  Primary   Relevant Medications   amLODipine (NORVASC) 10 MG tablet   Other Relevant Orders   POCT urinalysis dipstick (Completed)   Lower extremity pain, right       Relevant Orders   VAS [place] LOWER EXTREMITY VENOUS (DVT)   Pain of right upper extremity       Relevant Orders   VAS [place] UPPER EXTREMITY VENOUS DUPLEX   History of DVT (deep vein thrombosis)       Lumbar back pain with radiculopathy affecting right lower extremity       Relevant Medications   cyclobenzaprine (FLEXERIL) 10 MG tablet   Muscle spasms of both lower extremities       Relevant Medications   cyclobenzaprine (FLEXERIL) 10 MG tablet   Hot flashes       Relevant  Medications   amLODipine (NORVASC) 10 MG tablet   Screening for diabetes mellitus       Relevant Orders   POCT glycosylated hemoglobin (Hb A1C) (Completed)   Abnormal urinalysis       Relevant Orders   Urine Culture   Healthcare maintenance       Relevant Orders   CBC with Differential   Comprehensive metabolic panel   Lipid Panel   TSH   Vitamin D, 25-hydroxy   Vitamin B12   Follow up          Meds ordered this encounter  Medications   amLODipine (NORVASC) 10 MG tablet    Sig: Take 1 tablet (10 mg total) by mouth daily.    Dispense:  30 tablet    Refill:  5   cyclobenzaprine (FLEXERIL) 10 MG tablet    Sig: TAKE 1 TABLET BY MOUTH 3 (THREE) TIMES DAILY AS NEEDED FOR MUSCLE SPASMS.    Dispense:  90 tablet    Refill:  0    Follow-up: Return in about 1 month (around 10/15/2018).    Azzie Glatter, FNP

## 2018-09-14 NOTE — Progress Notes (Signed)
Right lower extremity venous duplex completed. Preliminary results in Chart review CV Proc. [facility], [place] 09/14/2018, 11:09 AM

## 2018-09-14 NOTE — Progress Notes (Signed)
Right upper extremity venous duplex completed. Preliminary results in Chart review CV Proc. [facility], [place] 09/14/2018, 11:07 am

## 2018-09-15 ENCOUNTER — Other Ambulatory Visit: Payer: Self-pay | Admitting: Family Medicine

## 2018-09-15 ENCOUNTER — Encounter: Payer: Self-pay | Admitting: Family Medicine

## 2018-09-15 DIAGNOSIS — E782 Mixed hyperlipidemia: Secondary | ICD-10-CM

## 2018-09-15 DIAGNOSIS — E559 Vitamin D deficiency, unspecified: Secondary | ICD-10-CM

## 2018-09-15 LAB — COMPREHENSIVE METABOLIC PANEL
ALT: 14 IU/L (ref 0–32)
AST: 14 IU/L (ref 0–40)
Albumin/Globulin Ratio: 1.6 (ref 1.2–2.2)
Albumin: 3.9 g/dL (ref 3.8–4.9)
Alkaline Phosphatase: 85 IU/L (ref 39–117)
BUN/Creatinine Ratio: 14 (ref 9–23)
BUN: 12 mg/dL (ref 6–24)
Bilirubin Total: 0.4 mg/dL (ref 0.0–1.2)
CO2: 23 mmol/L (ref 20–29)
Calcium: 9.3 mg/dL (ref 8.7–10.2)
Chloride: 106 mmol/L (ref 96–106)
Creatinine, Ser: 0.87 mg/dL (ref 0.57–1.00)
GFR calc Af Amer: 85 mL/min/{1.73_m2} (ref >59)
GFR calc non Af Amer: 74 mL/min/{1.73_m2} (ref >59)
Globulin, Total: 2.4 g/dL (ref 1.5–4.5)
Glucose: 87 mg/dL (ref 65–99)
Potassium: 3.7 mmol/L (ref 3.5–5.2)
Sodium: 142 mmol/L (ref 134–144)
Total Protein: 6.3 g/dL (ref 6.0–8.5)

## 2018-09-15 LAB — LIPID PANEL
Chol/HDL Ratio: 5 ratio — ABNORMAL HIGH (ref 0.0–4.4)
Cholesterol, Total: 211 mg/dL — ABNORMAL HIGH (ref 100–199)
HDL: 42 mg/dL (ref >39)
LDL Calculated: 154 mg/dL — ABNORMAL HIGH (ref 0–99)
Triglycerides: 75 mg/dL (ref 0–149)
VLDL Cholesterol Cal: 15 mg/dL (ref 5–40)

## 2018-09-15 LAB — CBC WITH DIFFERENTIAL/PLATELET
Basophils Absolute: 0 10*3/uL (ref 0.0–0.2)
Basos: 1 %
EOS (ABSOLUTE): 0.1 10*3/uL (ref 0.0–0.4)
Eos: 1 %
Hematocrit: 43.6 % (ref 34.0–46.6)
Hemoglobin: 13.9 g/dL (ref 11.1–15.9)
Immature Grans (Abs): 0 10*3/uL (ref 0.0–0.1)
Immature Granulocytes: 0 %
Lymphocytes Absolute: 1.1 10*3/uL (ref 0.7–3.1)
Lymphs: 24 %
MCH: 30.2 pg (ref 26.6–33.0)
MCHC: 31.9 g/dL (ref 31.5–35.7)
MCV: 95 fL (ref 79–97)
Monocytes Absolute: 0.2 10*3/uL (ref 0.1–0.9)
Monocytes: 6 %
Neutrophils Absolute: 3 10*3/uL (ref 1.4–7.0)
Neutrophils: 68 %
Platelets: 285 10*3/uL (ref 150–450)
RBC: 4.61 x10E6/uL (ref 3.77–5.28)
RDW: 13.4 % (ref 11.7–15.4)
WBC: 4.4 10*3/uL (ref 3.4–10.8)

## 2018-09-15 LAB — VITAMIN D 25 HYDROXY (VIT D DEFICIENCY, FRACTURES): Vit D, 25-Hydroxy: 16.6 ng/mL — ABNORMAL LOW (ref 30.0–100.0)

## 2018-09-15 LAB — TSH: TSH: 1.03 u[IU]/mL (ref 0.450–4.500)

## 2018-09-15 LAB — VITAMIN B12: Vitamin B-12: 435 pg/mL (ref 232–1245)

## 2018-09-15 MED ORDER — VITAMIN D (ERGOCALCIFEROL) 1.25 MG (50000 UNIT) PO CAPS: 50000.0000 [IU] | ORAL_CAPSULE | ORAL | 3 refills | Status: DC

## 2018-09-15 MED ORDER — PRAVASTATIN SODIUM 80 MG PO TABS: 80.0000 mg | ORAL_TABLET | Freq: Every day | ORAL | 3 refills | Status: DC

## 2018-09-16 LAB — URINE CULTURE

## 2018-09-19 ENCOUNTER — Telehealth: Payer: Self-pay

## 2018-09-20 NOTE — Telephone Encounter (Signed)
Patient states that she was able to get her medications

## 2018-10-10 ENCOUNTER — Other Ambulatory Visit: Payer: Self-pay

## 2018-10-10 DIAGNOSIS — I10 Essential (primary) hypertension: Secondary | ICD-10-CM

## 2018-10-10 DIAGNOSIS — M62838 Other muscle spasm: Secondary | ICD-10-CM

## 2018-10-10 DIAGNOSIS — E782 Mixed hyperlipidemia: Secondary | ICD-10-CM

## 2018-10-10 MED ORDER — CYCLOBENZAPRINE HCL 10 MG PO TABS: ORAL_TABLET | ORAL | 0 refills | Status: DC

## 2018-10-10 MED ORDER — PRAVASTATIN SODIUM 80 MG PO TABS: 80.0000 mg | ORAL_TABLET | Freq: Every day | ORAL | 3 refills | Status: DC

## 2018-10-10 MED ORDER — AMLODIPINE BESYLATE 10 MG PO TABS: 10.0000 mg | ORAL_TABLET | Freq: Every day | ORAL | 5 refills | Status: DC

## 2018-10-10 NOTE — Telephone Encounter (Signed)
Medication sent to pharmacy  

## 2018-10-17 ENCOUNTER — Ambulatory Visit: Payer: 59 | Admitting: Family Medicine

## 2018-10-21 ENCOUNTER — Telehealth: Payer: Self-pay | Admitting: Family Medicine

## 2018-10-21 NOTE — Telephone Encounter (Signed)
Multiple attempts to contact patient at both numbers listed in her chart. Unable to leave voice message. We will continue to try to reach her.

## 2018-10-24 NOTE — Telephone Encounter (Signed)
Sent to np

## 2018-10-25 ENCOUNTER — Encounter: Payer: Self-pay | Admitting: Family Medicine

## 2018-10-25 ENCOUNTER — Ambulatory Visit (INDEPENDENT_AMBULATORY_CARE_PROVIDER_SITE_OTHER): Payer: 59 | Admitting: Family Medicine

## 2018-10-25 ENCOUNTER — Other Ambulatory Visit: Payer: Self-pay

## 2018-10-25 VITALS — BP 142/75 | HR 86 | Temp 98.7°F | Resp 16 | Ht 65.0 in | Wt 164.0 lb

## 2018-10-25 DIAGNOSIS — I10 Essential (primary) hypertension: Secondary | ICD-10-CM | POA: Diagnosis not present

## 2018-10-25 DIAGNOSIS — Z86718 Personal history of other venous thrombosis and embolism: Secondary | ICD-10-CM | POA: Diagnosis not present

## 2018-10-25 DIAGNOSIS — M62838 Other muscle spasm: Secondary | ICD-10-CM | POA: Diagnosis not present

## 2018-10-25 DIAGNOSIS — Z09 Encounter for follow-up examination after completed treatment for conditions other than malignant neoplasm: Secondary | ICD-10-CM

## 2018-10-25 DIAGNOSIS — Z1239 Encounter for other screening for malignant neoplasm of breast: Secondary | ICD-10-CM

## 2018-10-25 DIAGNOSIS — M25511 Pain in right shoulder: Secondary | ICD-10-CM

## 2018-10-25 DIAGNOSIS — G8929 Other chronic pain: Secondary | ICD-10-CM

## 2018-10-25 DIAGNOSIS — R232 Flushing: Secondary | ICD-10-CM

## 2018-10-25 DIAGNOSIS — Z0000 Encounter for general adult medical examination without abnormal findings: Secondary | ICD-10-CM

## 2018-10-25 MED ORDER — TRAMADOL HCL 50 MG PO TABS: 50.0000 mg | ORAL_TABLET | Freq: Four times a day (QID) | ORAL | 0 refills | Status: AC | PRN

## 2018-10-25 NOTE — Progress Notes (Signed)
Patient [facility]  Annual Physical  Subjective:  Patient ID: [name], female    DOB: 1960/08/03  Age: 58 y.o. MRN: [id number]  CC:  Chief Complaint  Patient presents with  . Follow-up    1 month follow up on bp     HPI [name] is a 58 year old female who presents for her Annual Physical today.   Past Medical History:  Diagnosis Date  . DVT (deep venous thrombosis) ([place])   . Headache(784.0)    otc meds prn   . History of hysterectomy 2014  . Hyperlipidemia   . Hypertension 09/2018  . SVD (spontaneous vaginal delivery)    x 5  . Vitamin D deficiency 09/2018   Current Status: Since her last office visit, she is doing well with no complaints. She has appointment for dental surgery on 11/10/2018. She denies visual changes, chest pain, cough, shortness of breath, heart palpitations, and falls. She has occasional headaches and dizziness with position changes. Denies severe headaches, confusion, seizures, double vision, and blurred vision, nausea and vomiting.  She denies pain, swelling, erythema, warmth, and tenderness in lower extremities. Denies sudden onset of dyspnea and coughing. Denies Productive frothy, pink-tinged sputum. Denies tachycardia, pallor, feelings of impending doom. Denies symptoms of DVT, history of A-fib, any recent surgeries, pregnancy, long-bone fractures, and prolonged inactivity (sedentary, long distant traveling).   She denies fevers, chills, fatigue, recent infections, weight loss, and night sweats. She has not had any headaches, visual changes, dizziness, and falls. No chest pain, heart palpitations, cough and shortness of breath reported. No reports of GI problems such as nausea, vomiting, diarrhea, and constipation. She has no reports of blood in stools, dysuria and hematuria. No depression or anxiety, and denies suicidal ideations, homicidal ideations, or auditory hallucinations. She denies pain today.    Past Surgical History:  Procedure Laterality Date  . ABDOMINAL HYSTERECTOMY  04/06/2012   Procedure: HYSTERECTOMY ABDOMINAL;  Surgeon: Margarette Asal, MD;  Location: [facility];  Service: Gynecology;  Laterality: N/A;  . BREAST SURGERY     benign turmor removed  . SALPINGOOPHORECTOMY  04/06/2012   Procedure: SALPINGO OOPHORECTOMY;  Surgeon: Margarette Asal, MD;  Location: [facility];  Service: Gynecology;  Laterality: Bilateral;  . TUBAL LIGATION      Family History  Problem Relation Age of Onset  . Heart failure Mother   . Hypertension Mother   . Breast cancer Sister   . Diabetes Sister     Social History   Socioeconomic History  . Marital status: Married    Spouse name: Not on file  . Number of children: Not on file  . Years of education: Not on file  . Highest education level: Not on file  Occupational History  . Not on file  Social Needs  . Financial resource strain: Not on file  . Food insecurity    Worry: Not on file    Inability: Not on file  . Transportation needs    Medical: Not on file    Non-medical: Not on file  Tobacco Use  . Smoking status: Current Every Day Smoker    Packs/day: 0.50    Years: 38.00    Pack years: 19.00  . Smokeless tobacco: Never Used  Substance and Sexual Activity  . Alcohol use: No    Comment:    . Drug use: No  . Sexual activity: Yes    Birth control/protection: Surgical  Lifestyle  .  Physical activity    Days per week: Not on file    Minutes per session: Not on file  . Stress: Not on file  Relationships  . Social Herbalist on phone: Not on file    Gets together: Not on file    Attends religious service: Not on file    Active member of club or organization: Not on file    Attends meetings of clubs or organizations: Not on file    Relationship status: Not on file  . Intimate partner violence    Fear of current or ex partner: Not on file    Emotionally abused: Not on file    Physically abused: Not on file     Forced sexual activity: Not on file  Other Topics Concern  . Not on file  Social History Narrative  . Not on file    Outpatient Medications Prior to Visit  Medication Sig Dispense Refill  . amLODipine (NORVASC) 10 MG tablet Take 1 tablet (10 mg total) by mouth daily. 30 tablet 5  . cyclobenzaprine (FLEXERIL) 10 MG tablet TAKE 1 TABLET BY MOUTH 3 (THREE) TIMES DAILY AS NEEDED FOR MUSCLE SPASMS. 90 tablet 0  . gabapentin (NEURONTIN) 300 MG capsule Take 1 capsule (300 mg total) by mouth 4 (four) times daily. 90 capsule 3  . pravastatin (PRAVACHOL) 80 MG tablet Take 1 tablet (80 mg total) by mouth daily. 30 tablet 3  . Vitamin D, Ergocalciferol, (DRISDOL) 1.25 MG (50000 UT) CAPS capsule Take 1 capsule (50,000 Units total) by mouth every 7 (seven) days. 5 capsule 3   No facility-administered medications prior to visit.     No Known Allergies  ROS Review of Systems  Constitutional: Negative.   HENT: Positive for dental problem.   Eyes: Negative.   Respiratory: Negative.   Cardiovascular: Negative.   Gastrointestinal: Negative.   Endocrine: Negative.   Genitourinary: Negative.   Skin: Negative.   Allergic/Immunologic: Negative.   Neurological: Positive for dizziness (occasional ) and headaches (occasional ).  Hematological: Negative.   Psychiatric/Behavioral: Negative.       Objective:    Physical Exam  Constitutional: She is oriented to person, place, and time. She appears well-developed and well-nourished.  HENT:  Head: Normocephalic and atraumatic.  Eyes: Conjunctivae are normal.  Neck: Normal range of motion. Neck supple.  Cardiovascular: Normal rate, regular rhythm, normal heart sounds and intact distal pulses.  Pulmonary/Chest: Effort normal and breath sounds normal.  Abdominal: Soft. Bowel sounds are normal.  Musculoskeletal: Normal range of motion.  Neurological: She is alert and oriented to person, place, and time. She has normal reflexes.  Skin: Skin is warm and  dry.  Psychiatric: She has a normal mood and affect. Her behavior is normal. Judgment and thought content normal.  Nursing note and vitals reviewed.   BP (!) 142/75 (BP Location: Left Arm, Patient Position: Sitting, Cuff Size: Normal)   Pulse 86   Temp 98.7 F (37.1 C) (Oral)   Resp 16   Ht 5\' 5"  (1.651 m)   Wt 164 lb (74.4 kg)   LMP 06/13/2011   SpO2 100%   BMI 27.29 kg/m  Wt Readings from Last 3 Encounters:  10/25/18 164 lb (74.4 kg)  09/14/18 160 lb 9.6 oz (72.8 kg)  10/26/17 153 lb (69.4 kg)     Health Maintenance Due  Topic Date Due  . COLONOSCOPY  03/26/2010  . MAMMOGRAM  12/19/2017  . INFLUENZA VACCINE  10/08/2018  There are no preventive care reminders to display for this patient.  Lab Results  Component Value Date   TSH 1.030 09/14/2018   Lab Results  Component Value Date   WBC 4.4 09/14/2018   HGB 13.9 09/14/2018   HCT 43.6 09/14/2018   MCV 95 09/14/2018   PLT 285 09/14/2018   Lab Results  Component Value Date   NA 142 09/14/2018   K 3.7 09/14/2018   CO2 23 09/14/2018   GLUCOSE 87 09/14/2018   BUN 12 09/14/2018   CREATININE 0.87 09/14/2018   BILITOT 0.4 09/14/2018   ALKPHOS 85 09/14/2018   AST 14 09/14/2018   ALT 14 09/14/2018   PROT 6.3 09/14/2018   ALBUMIN 3.9 09/14/2018   CALCIUM 9.3 09/14/2018   ANIONGAP 6 09/02/2015   Lab Results  Component Value Date   CHOL 211 (H) 09/14/2018   Lab Results  Component Value Date   HDL 42 09/14/2018   Lab Results  Component Value Date   LDLCALC 154 (H) 09/14/2018   Lab Results  Component Value Date   TRIG 75 09/14/2018   Lab Results  Component Value Date   CHOLHDL 5.0 (H) 09/14/2018   Lab Results  Component Value Date   HGBA1C 5.4 09/14/2018   Assessment & Plan:   1. Annual physical exam Normal physical examination today.   2. Essential hypertension The current medical regimen is effective; blood pressure is stable at 142/75 today; continue present plan and medications as  prescribed. She will continue to decrease high sodium intake, excessive alcohol intake, increase potassium intake, smoking cessation, and increase physical activity of at least 30 minutes of cardio activity daily. She will continue to follow Heart Healthy or DASH diet.  3. History of DVT (deep vein thrombosis) No signs or symptoms of recurrence noted or reported.   4. Muscle spasms of both lower extremities  5. Hot flashes Stable. Not worsening.   6. Follow up She will follow up in 3 months.    Meds ordered this encounter  Medications  . traMADol (ULTRAM) 50 MG tablet    Sig: Take 1-2 tablets (50-100 mg total) by mouth every 6 (six) hours as needed for moderate pain.    Dispense:  60 tablet    Refill:  0    Order Specific Question:   Supervising Provider    Answer:   Tresa Garter [id number]    No orders of the defined types were placed in this encounter.   Referral Orders  No referral(s) requested today    Kathe Becton,  MSN, FNP-BC [place] [address] [place], [place] [address] [phone] fax  Problem List Items Addressed This Visit      Cardiovascular and Mediastinum   Essential hypertension     Other   History of DVT (deep vein thrombosis)   Muscle spasms of both lower extremities    Other Visit Diagnoses    Annual physical exam    -  Primary   Hot flashes       Screening for breast cancer       Follow up       Chronic right shoulder pain       Relevant Medications   traMADol (ULTRAM) 50 MG tablet      Meds ordered this encounter  Medications  . traMADol (ULTRAM) 50 MG tablet    Sig: Take 1-2 tablets (50-100 mg total) by mouth every 6 (  six) hours as needed for moderate pain.    Dispense:  60 tablet    Refill:  0    Order Specific Question:   Supervising Provider    Answer:   Tresa Garter [id number]    Follow-up: No follow-ups on file.     Azzie Glatter, FNP

## 2018-10-29 DIAGNOSIS — R232 Flushing: Secondary | ICD-10-CM | POA: Insufficient documentation

## 2019-01-22 ENCOUNTER — Encounter (HOSPITAL_COMMUNITY): Payer: Self-pay

## 2019-01-22 ENCOUNTER — Emergency Department (HOSPITAL_COMMUNITY)
Admission: EM | Admit: 2019-01-22 | Discharge: 2019-01-22 | Disposition: A | Discharge status: Home / Self Care | Payer: 59 | Attending: Emergency Medicine | Admitting: Emergency Medicine

## 2019-01-22 ENCOUNTER — Emergency Department (HOSPITAL_COMMUNITY): Payer: 59

## 2019-01-22 ENCOUNTER — Other Ambulatory Visit: Payer: Self-pay

## 2019-01-22 DIAGNOSIS — S161XXA Strain of muscle, fascia and tendon at neck level, initial encounter: Secondary | ICD-10-CM | POA: Insufficient documentation

## 2019-01-22 DIAGNOSIS — I10 Essential (primary) hypertension: Secondary | ICD-10-CM | POA: Diagnosis not present

## 2019-01-22 DIAGNOSIS — M25561 Pain in right knee: Secondary | ICD-10-CM | POA: Diagnosis present

## 2019-01-22 DIAGNOSIS — Z79899 Other long term (current) drug therapy: Secondary | ICD-10-CM | POA: Diagnosis not present

## 2019-01-22 DIAGNOSIS — Y92410 Unspecified street and highway as the place of occurrence of the external cause: Secondary | ICD-10-CM | POA: Insufficient documentation

## 2019-01-22 DIAGNOSIS — Y999 Unspecified external cause status: Secondary | ICD-10-CM | POA: Diagnosis not present

## 2019-01-22 DIAGNOSIS — F17200 Nicotine dependence, unspecified, uncomplicated: Secondary | ICD-10-CM | POA: Diagnosis not present

## 2019-01-22 DIAGNOSIS — Y939 Activity, unspecified: Secondary | ICD-10-CM | POA: Diagnosis not present

## 2019-01-22 MED ORDER — IBUPROFEN 800 MG PO TABS
800.0000 mg | ORAL_TABLET | Freq: Once | ORAL | Status: AC
  Administered 2019-01-22: 800 mg via ORAL
  Filled 2019-01-22: qty 1

## 2019-01-22 MED ORDER — IBUPROFEN 600 MG PO TABS: 600.0000 mg | ORAL_TABLET | Freq: Four times a day (QID) | ORAL | 0 refills | Status: DC | PRN

## 2019-01-22 MED ORDER — CYCLOBENZAPRINE HCL 10 MG PO TABS: 10.0000 mg | ORAL_TABLET | Freq: Two times a day (BID) | ORAL | 0 refills | Status: DC | PRN
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
## 2019-01-22 NOTE — ED Notes (Addendum)
An After Visit Summary was printed and given to the patient. Discharge instructions given and no further questions at this time. Pt states her daughter is taking her home.

## 2019-01-25 ENCOUNTER — Ambulatory Visit: Payer: 59 | Admitting: Family Medicine

## 2019-01-26 ENCOUNTER — Telehealth: Payer: Self-pay | Admitting: Family Medicine

## 2019-01-26 NOTE — Telephone Encounter (Signed)
Called Pt I dint leave a Programmer, systems never came thru

## 2019-02-24 ENCOUNTER — Encounter: Payer: Self-pay | Admitting: Family Medicine

## 2019-02-24 ENCOUNTER — Ambulatory Visit (INDEPENDENT_AMBULATORY_CARE_PROVIDER_SITE_OTHER): Payer: 59 | Admitting: Family Medicine

## 2019-02-24 ENCOUNTER — Other Ambulatory Visit: Payer: Self-pay

## 2019-02-24 VITALS — BP 150/90 | HR 80 | Temp 98.0°F | Ht 66.0 in | Wt 163.8 lb

## 2019-02-24 DIAGNOSIS — R109 Unspecified abdominal pain: Secondary | ICD-10-CM

## 2019-02-24 DIAGNOSIS — R232 Flushing: Secondary | ICD-10-CM

## 2019-02-24 DIAGNOSIS — Z09 Encounter for follow-up examination after completed treatment for conditions other than malignant neoplasm: Secondary | ICD-10-CM

## 2019-02-24 DIAGNOSIS — R8290 Unspecified abnormal findings in urine: Secondary | ICD-10-CM | POA: Diagnosis not present

## 2019-02-24 DIAGNOSIS — R10A1 Flank pain, right side: Secondary | ICD-10-CM

## 2019-02-24 DIAGNOSIS — I10 Essential (primary) hypertension: Secondary | ICD-10-CM

## 2019-02-24 LAB — POCT URINALYSIS DIPSTICK
Bilirubin, UA: NEGATIVE
Glucose, UA: NEGATIVE
Ketones, UA: NEGATIVE
Nitrite, UA: NEGATIVE
Protein, UA: POSITIVE — AB
Spec Grav, UA: 1.025 (ref 1.010–1.025)
Urobilinogen, UA: 1 U/dL
pH, UA: 6.5 (ref 5.0–8.0)

## 2019-02-24 MED ORDER — OXYCODONE-ACETAMINOPHEN 10-325 MG PO TABS: 1.0000 | ORAL_TABLET | Freq: Three times a day (TID) | ORAL | 0 refills | Status: AC | PRN

## 2019-02-24 NOTE — Patient Instructions (Signed)
Muscle Strain A muscle strain is an injury that happens when a muscle is stretched longer than normal. This can happen during a fall, sports, or lifting. This can tear some muscle fibers. Usually, recovery from muscle strain takes 1-2 weeks. Complete healing normally takes 5-6 weeks. This condition is first treated with PRICE therapy. This involves:  Protecting your muscle from being injured again.  Resting your injured muscle.  Icing your injured muscle.  Applying pressure (compression) to your injured muscle. This may be done with a splint or elastic bandage.  Raising (elevating) your injured muscle. Your doctor may also recommend medicine for pain. Follow these instructions at home: If you have a splint:  Wear the splint as told by your doctor. Take it off only as told by your doctor.  Loosen the splint if your fingers or toes tingle, get numb, or turn cold and blue.  Keep the splint clean.  If the splint is not waterproof: ? Do not let it get wet. ? Cover it with a watertight covering when you take a bath or a shower. Managing pain, stiffness, and swelling   If directed, put ice on your injured area. ? If you have a removable splint, take it off as told by your doctor. ? Put ice in a plastic bag. ? Place a towel between your skin and the bag. ? Leave the ice on for 20 minutes, 2-3 times a day.  Move your fingers or toes often. This helps to avoid stiffness and lessen swelling.  Raise your injured area above the level of your heart while you are sitting or lying down.  Wear an elastic bandage as told by your doctor. Make sure it is not too tight. General instructions  Take over-the-counter and prescription medicines only as told by your doctor.  Limit your activity. Rest your injured muscle as told by your doctor. Your doctor may say that gentle movements are okay.  If physical therapy was prescribed, do exercises as told by your doctor.  Do not put pressure on any  part of the splint until it is fully hardened. This may take many hours.  Do not use any products that contain nicotine or tobacco, such as cigarettes and e-cigarettes. These can delay bone healing. If you need help quitting, ask your doctor.  Warm up before you exercise. This helps to prevent more muscle strains.  Ask your doctor when it is safe to drive if you have a splint.  Keep all follow-up visits as told by your doctor. This is important. Contact a doctor if:  You have more pain or swelling in your injured area. Get help right away if:  You have any of these problems in your injured area: ? You have numbness. ? You have tingling. ? You lose a lot of strength. Summary  A muscle strain is an injury that happens when a muscle is stretched longer than normal.  This condition is first treated with PRICE therapy. This includes protecting, resting, icing, adding pressure, and raising your injury.  Limit your activity. Rest your injured muscle as told by your doctor. Your doctor may say that gentle movements are okay.  Warm up before you exercise. This helps to prevent more muscle strains. This information is not intended to replace advice given to you by your health care provider. Make sure you discuss any questions you have with your health care provider. Document Released: 12/03/2007 Document Revised: 04/21/2018 Document Reviewed: 04/01/2016 Elsevier Patient Education  2020 [employer].  Acetaminophen; Oxycodone tablets What is this medicine? ACETAMINOPHEN; OXYCODONE (a set a MEE noe fen; ox i KOE done) is a pain reliever. It is used to treat moderate to severe pain. This medicine may be used for other purposes; ask your health care provider or pharmacist if you have questions. COMMON BRAND NAME(S): Endocet, Magnacet, Nalocet, Narvox, Percocet, Perloxx, Primalev, Primlev, Roxicet, Xolox What should I tell my health care provider before I take this medicine? They need to know if  you have any of these conditions:  brain tumor  Crohn's disease, inflammatory bowel disease, or ulcerative colitis  drug abuse or addiction  head injury  heart or circulation problems  if you often drink alcohol  kidney disease or problems going to the bathroom  liver disease  lung disease, asthma, or breathing problems  an unusual or allergic reaction to acetaminophen, oxycodone, other opioid analgesics, other medicines, foods, dyes, or preservatives  pregnant or trying to get pregnant  breast-feeding How should I use this medicine? Take this medicine by mouth with a full glass of water. Follow the directions on the prescription label. You can take it with or without food. If it upsets your stomach, take it with food. Take your medicine at regular intervals. Do not take it more often than directed. A special MedGuide will be given to you by the pharmacist with each prescription and refill. Be sure to read this information carefully each time. Talk to your pediatrician regarding the use of this medicine in children. Special care may be needed. Overdosage: If you think you have taken too much of this medicine contact a poison control center or emergency room at once. NOTE: This medicine is only for you. Do not share this medicine with others. What if I miss a dose? If you miss a dose, take it as soon as you can. If it is almost time for your next dose, take only that dose. Do not take double or extra doses. What may interact with this medicine? This medicine may interact with the following medications:  alcohol  antihistamines for allergy, cough and cold  antiviral medicines for HIV or AIDS  atropine  certain antibiotics like clarithromycin, erythromycin, linezolid, rifampin  certain medicines for anxiety or sleep  certain medicines for bladder problems like oxybutynin, tolterodine  certain medicines for depression like amitriptyline, fluoxetine, sertraline  certain  medicines for fungal infections like ketoconazole, itraconazole, voriconazole  certain medicines for migraine headache like almotriptan, eletriptan, frovatriptan, naratriptan, rizatriptan, sumatriptan, zolmitriptan  certain medicines for nausea or vomiting like dolasetron, ondansetron, palonosetron  certain medicines for Parkinson's disease like benztropine, trihexyphenidyl  certain medicines for seizures like phenobarbital, phenytoin, primidone  certain medicines for stomach problems like dicyclomine, hyoscyamine  certain medicines for travel sickness like scopolamine  diuretics  general anesthetics like halothane, isoflurane, methoxyflurane, propofol  ipratropium  local anesthetics like lidocaine, pramoxine, tetracaine  MAOIs like Carbex, Eldepryl, Marplan, Nardil, and Parnate  medicines that relax muscles for surgery  methylene blue  nilotinib  other medicines with acetaminophen  other narcotic medicines for pain or cough  phenothiazines like chlorpromazine, mesoridazine, prochlorperazine, thioridazine This list may not describe all possible interactions. Give your health care provider a list of all the medicines, herbs, non-prescription drugs, or dietary supplements you use. Also tell them if you smoke, drink alcohol, or use illegal drugs. Some items may interact with your medicine. What should I watch for while using this medicine? Tell your doctor or health care professional if your pain does not  go away, if it gets worse, or if you have new or a different type of pain. You may develop tolerance to the medicine. Tolerance means that you will need a higher dose of the medication for pain relief. Tolerance is normal and is expected if you take this medicine for a long time. Do not suddenly stop taking your medicine because you may develop a severe reaction. Your body becomes used to the medicine. This does NOT mean you are addicted. Addiction is a behavior related to getting  and using a drug for a non-medical reason. If you have pain, you have a medical reason to take pain medicine. Your doctor will tell you how much medicine to take. If your doctor wants you to stop the medicine, the dose will be slowly lowered over time to avoid any side effects. There are different types of narcotic medicines (opiates). If you take more than one type at the same time or if you are taking another medicine that also causes drowsiness, you may have more side effects. Give your health care provider a list of all medicines you use. Your doctor will tell you how much medicine to take. Do not take more medicine than directed. Call emergency for help if you have problems breathing or unusual sleepiness. Do not take other medicines that contain acetaminophen with this medicine. Always read labels carefully. If you have questions, ask your doctor or pharmacist. If you take too much acetaminophen get medical help right away. Too much acetaminophen can be very dangerous and cause liver damage. Even if you do not have symptoms, it is important to get help right away. You may get drowsy or dizzy. Do not drive, use machinery, or do anything that needs mental alertness until you know how this medicine affects you. Do not stand or sit up quickly, especially if you are an older patient. This reduces the risk of dizzy or fainting spells. Alcohol may interfere with the effect of this medicine. Avoid alcoholic drinks. The medicine will cause constipation. Try to have a bowel movement at least every 2 to 3 days. If you do not have a bowel movement for 3 days, call your doctor or health care professional. Your mouth may get dry. Chewing sugarless gum or sucking hard candy, and drinking plenty or water may help. Contact your doctor if the problem does not go away or is severe. What side effects may I notice from receiving this medicine? Side effects that you should report to your doctor or health care professional  as soon as possible:  allergic reactions like skin rash, itching or hives, swelling of the face, lips, or tongue  breathing problems  confusion  redness, blistering, peeling or loosening of the skin, including inside the mouth  signs and symptoms of liver injury like dark yellow or brown urine; general ill feeling or flu-like symptoms; light-colored stools; loss of appetite; nausea; right upper belly pain; unusually weak or tired; yellowing of the eyes or skin  signs and symptoms of low blood pressure like dizziness; feeling faint or lightheaded, falls; unusually weak or tired  trouble passing urine or change in the amount of urine Side effects that usually do not require medical attention (report to your doctor or health care professional if they continue or are bothersome):  constipation  dry mouth  nausea, vomiting  tiredness This list may not describe all possible side effects. Call your doctor for medical advice about side effects. You may report side effects to FDA at  1-800-FDA-1088. Where should I keep my medicine? Keep out of the reach of children. This medicine can be abused. Keep your medicine in a safe place to protect it from theft. Do not share this medicine with anyone. Selling or giving away this medicine is dangerous and against the law. Store at room temperature between 20 and 25 degrees C (68 and 77 degrees F). This medicine may cause harm and death if it is taken by other adults, children, or pets. Return medicine that has not been used to an official disposal site. Contact the DEA at 937 009 3520 or your city/county government to find a site. If you cannot return the medicine, flush it down the toilet. Do not use the medicine after the expiration date. NOTE: This sheet is a summary. It may not cover all possible information. If you have questions about this medicine, talk to your doctor, pharmacist, or health care provider.  2020 Elsevier/Gold Standard (2016-06-30  15:46:38)
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
## 2019-02-26 LAB — URINE CULTURE

## 2019-03-15 ENCOUNTER — Telehealth: Payer: Self-pay

## 2019-03-15 ENCOUNTER — Other Ambulatory Visit: Payer: Self-pay | Admitting: Family Medicine

## 2019-03-15 DIAGNOSIS — R10A1 Flank pain, right side: Secondary | ICD-10-CM

## 2019-03-15 DIAGNOSIS — R109 Unspecified abdominal pain: Secondary | ICD-10-CM

## 2019-03-15 NOTE — Telephone Encounter (Signed)
Patient called requesting a refill for Percocet 10/325mg . Please advise.

## 2019-03-16 NOTE — Telephone Encounter (Signed)
Called, no answer. Voicemail was not set up. Unable to leave a message.

## 2019-03-17 ENCOUNTER — Telehealth: Payer: Self-pay | Admitting: Family Medicine

## 2019-03-17 NOTE — Telephone Encounter (Signed)
Called, no answer. Voicemail not set up, unable to leave a message.

## 2019-03-17 NOTE — Telephone Encounter (Signed)
[name] was only given #21 Percocet pills at the pharmacy. I called  [facility] to confirm the number given. Per [facility] only 21 pills were given. There a few reason as to why, one is her insurance.   [name] has requested the additional pills to be sent to [facility](#9).   Please advise.

## 2019-03-29 ENCOUNTER — Telehealth: Payer: Self-pay | Admitting: Family Medicine

## 2019-03-30 NOTE — Telephone Encounter (Signed)
[name] is now ready for the rest of her pain medicine to be released. Please send to [facility] on [address].  Please see note on 03/17/2019 about the matter.   Thank you.

## 2019-03-30 NOTE — Telephone Encounter (Signed)
Call returned. Patient not available I am not able to leave a voice message.

## 2019-03-31 ENCOUNTER — Other Ambulatory Visit: Payer: Self-pay | Admitting: Family Medicine

## 2019-03-31 DIAGNOSIS — E782 Mixed hyperlipidemia: Secondary | ICD-10-CM

## 2019-03-31 DIAGNOSIS — M5010 Cervical disc disorder with radiculopathy, unspecified cervical region: Secondary | ICD-10-CM

## 2019-03-31 DIAGNOSIS — E559 Vitamin D deficiency, unspecified: Secondary | ICD-10-CM

## 2019-03-31 DIAGNOSIS — M5416 Radiculopathy, lumbar region: Secondary | ICD-10-CM

## 2019-03-31 MED ORDER — IBUPROFEN 600 MG PO TABS: 600.0000 mg | ORAL_TABLET | Freq: Four times a day (QID) | ORAL | 3 refills | Status: DC | PRN

## 2019-03-31 MED ORDER — PRAVASTATIN SODIUM 80 MG PO TABS: 80.0000 mg | ORAL_TABLET | Freq: Every day | ORAL | 3 refills | Status: DC

## 2019-03-31 MED ORDER — VITAMIN D (ERGOCALCIFEROL) 1.25 MG (50000 UNIT) PO CAPS: 50000.0000 [IU] | ORAL_CAPSULE | ORAL | 3 refills | Status: DC

## 2019-03-31 MED ORDER — CYCLOBENZAPRINE HCL 10 MG PO TABS: 10.0000 mg | ORAL_TABLET | Freq: Two times a day (BID) | ORAL | 3 refills | Status: DC | PRN

## 2019-03-31 MED ORDER — CYCLOBENZAPRINE HCL 10 MG PO TABS: 10.0000 mg | ORAL_TABLET | Freq: Two times a day (BID) | ORAL | 0 refills | Status: DC | PRN

## 2019-03-31 MED ORDER — IBUPROFEN 600 MG PO TABS: 600.0000 mg | ORAL_TABLET | Freq: Four times a day (QID) | ORAL | 0 refills | Status: DC | PRN

## 2019-03-31 MED ORDER — GABAPENTIN 300 MG PO CAPS: 300.0000 mg | ORAL_CAPSULE | Freq: Four times a day (QID) | ORAL | 3 refills | Status: DC

## 2019-03-31 NOTE — Telephone Encounter (Signed)
Will you be sending the rest of Mrs. [name]'s Rx?

## 2019-04-10 ENCOUNTER — Telehealth: Payer: Self-pay | Admitting: Family Medicine

## 2019-04-10 NOTE — Telephone Encounter (Signed)
[name] stated the ibuprofen is not helping her back pain.

## 2019-04-11 ENCOUNTER — Other Ambulatory Visit: Payer: Self-pay | Admitting: Family Medicine

## 2019-04-11 DIAGNOSIS — G8921 Chronic pain due to trauma: Secondary | ICD-10-CM

## 2019-04-11 DIAGNOSIS — M5416 Radiculopathy, lumbar region: Secondary | ICD-10-CM

## 2019-04-11 DIAGNOSIS — M5010 Cervical disc disorder with radiculopathy, unspecified cervical region: Secondary | ICD-10-CM

## 2019-08-08 DIAGNOSIS — B070 Plantar wart: Secondary | ICD-10-CM

## 2019-08-08 HISTORY — DX: Plantar wart: B07.0

## 2019-08-25 ENCOUNTER — Ambulatory Visit: Payer: 59 | Admitting: Family Medicine

## 2019-09-05 ENCOUNTER — Encounter: Payer: Self-pay | Admitting: Family Medicine

## 2019-09-05 ENCOUNTER — Other Ambulatory Visit: Payer: Self-pay

## 2019-09-05 ENCOUNTER — Ambulatory Visit (INDEPENDENT_AMBULATORY_CARE_PROVIDER_SITE_OTHER): Payer: 59 | Admitting: Family Medicine

## 2019-09-05 VITALS — BP 144/79 | HR 82 | Temp 97.9°F | Ht 66.0 in | Wt 165.0 lb

## 2019-09-05 DIAGNOSIS — B070 Plantar wart: Secondary | ICD-10-CM

## 2019-09-05 DIAGNOSIS — M5416 Radiculopathy, lumbar region: Secondary | ICD-10-CM

## 2019-09-05 DIAGNOSIS — Z1231 Encounter for screening mammogram for malignant neoplasm of breast: Secondary | ICD-10-CM

## 2019-09-05 DIAGNOSIS — R232 Flushing: Secondary | ICD-10-CM | POA: Diagnosis not present

## 2019-09-05 DIAGNOSIS — Z09 Encounter for follow-up examination after completed treatment for conditions other than malignant neoplasm: Secondary | ICD-10-CM

## 2019-09-05 DIAGNOSIS — I10 Essential (primary) hypertension: Secondary | ICD-10-CM | POA: Diagnosis not present

## 2019-09-05 LAB — POCT URINALYSIS DIPSTICK
Bilirubin, UA: NEGATIVE
Blood, UA: NEGATIVE
Glucose, UA: NEGATIVE
Ketones, UA: NEGATIVE
Leukocytes, UA: NEGATIVE
Nitrite, UA: NEGATIVE
Protein, UA: NEGATIVE
Spec Grav, UA: 1.02 (ref 1.010–1.025)
Urobilinogen, UA: 0.2 E.U./dL
pH, UA: 7 (ref 5.0–8.0)

## 2019-09-05 NOTE — Progress Notes (Signed)
Patient [facility]    Established Patient Office Visit  Subjective:  Patient ID: [name], female    DOB: 10/20/1960  Age: 59 y.o. MRN: [id number]  CC:  Chief Complaint  Patient presents with  . Follow-up    6 month follow up; no concerns    HPI [name] is a 59 year old female who presents for Follow Up today.    Patient Active Problem List   Diagnosis Date Noted  . Hot flashes 10/29/2018  . Essential hypertension 09/14/2018  . Lower extremity pain, right 09/14/2018  . Pain of right upper extremity 09/14/2018  . History of DVT (deep vein thrombosis) 09/14/2018  . Muscle spasms of both lower extremities 09/14/2018  . Cervical disc disorder with radiculopathy of cervical region 01/15/2016  . Low back pain 12/25/2015  . VTE (venous thromboembolism) 04/15/2012  . Leiomyoma 04/08/2012    Past Medical History:  Diagnosis Date  . DVT (deep venous thrombosis) ([place])   . Headache(784.0)    otc meds prn   . History of hysterectomy 2014  . Hyperlipidemia   . Hypertension 09/2018  . SVD (spontaneous vaginal delivery)    x 5  . Vitamin D deficiency 09/2018    Current Status: Since her last office visit, she has c/o left foot plantar wart, which is causing her increased pain with walking. She had tried OTC remedies which have not been effective. She denies visual changes, chest pain, cough, shortness of breath, heart palpitations, and falls. She has occasional headaches and dizziness with position changes. Denies severe headaches, confusion, seizures, double vision, and blurred vision, nausea and vomiting. She denies fevers, chills, fatigue, recent infections, weight loss, and night sweats. Denies GI problems such as nausea, vomiting, diarrhea, and constipation. She has no reports of blood in stools, dysuria and hematuria. No depression or anxiety, and denies suicidal ideations, homicidal ideations, or auditory hallucinations.  She is taking all medications as prescribed. She denies pain today.   Past Surgical History:  Procedure Laterality Date  . ABDOMINAL HYSTERECTOMY  04/06/2012   Procedure: HYSTERECTOMY ABDOMINAL;  Surgeon: Margarette Asal, MD;  Location: [facility];  Service: Gynecology;  Laterality: N/A;  . BREAST SURGERY     benign turmor removed  . SALPINGOOPHORECTOMY  04/06/2012   Procedure: SALPINGO OOPHORECTOMY;  Surgeon: Margarette Asal, MD;  Location: [facility];  Service: Gynecology;  Laterality: Bilateral;  . TUBAL LIGATION      Family History  Problem Relation Age of Onset  . Heart failure Mother   . Hypertension Mother   . Breast cancer Sister   . Diabetes Sister     Social History   Socioeconomic History  . Marital status: Married    Spouse name: Not on file  . Number of children: Not on file  . Years of education: Not on file  . Highest education level: Not on file  Occupational History  . Not on file  Tobacco Use  . Smoking status: Current Every Day Smoker    Packs/day: 0.25    Years: 38.00    Pack years: 9.50    Types: Cigarettes  . Smokeless tobacco: Never Used  . Tobacco comment: 3 per day.   Vaping Use  . Vaping Use: Never used  Substance and Sexual Activity  . Alcohol use: No    Comment:    . Drug use: No  . Sexual activity: Yes    Birth control/protection: Surgical  Other  Topics Concern  . Not on file  Social History Narrative  . Not on file   Social Determinants of Health   Financial Resource Strain:   . Difficulty of Paying Living Expenses:   Food Insecurity:   . Worried About Charity fundraiser in the Last Year:   . Arboriculturist in the Last Year:   Transportation Needs:   . Film/video editor (Medical):   Marland Kitchen Lack of Transportation (Non-Medical):   Physical Activity:   . Days of Exercise per Week:   . Minutes of Exercise per Session:   Stress:   . Feeling of Stress :   Social Connections:   . Frequency of Communication with Friends and Family:    . Frequency of Social Gatherings with Friends and Family:   . Attends Religious Services:   . Active Member of Clubs or Organizations:   . Attends Archivist Meetings:   Marland Kitchen Marital Status:   Intimate Partner Violence:   . Fear of Current or Ex-Partner:   . Emotionally Abused:   Marland Kitchen Physically Abused:   . Sexually Abused:     Outpatient Medications Prior to Visit  Medication Sig Dispense Refill  . amLODipine (NORVASC) 10 MG tablet Take 1 tablet (10 mg total) by mouth daily. 30 tablet 5  . cyclobenzaprine (FLEXERIL) 10 MG tablet Take 1 tablet (10 mg total) by mouth 2 (two) times daily as needed for muscle spasms. 60 tablet 3  . gabapentin (NEURONTIN) 300 MG capsule Take 1 capsule (300 mg total) by mouth 4 (four) times daily. 90 capsule 3  . pravastatin (PRAVACHOL) 80 MG tablet Take 1 tablet (80 mg total) by mouth daily. 30 tablet 3  . Vitamin D, Ergocalciferol, (DRISDOL) 1.25 MG (50000 UNIT) CAPS capsule Take 1 capsule (50,000 Units total) by mouth every 7 (seven) days. 5 capsule 3  . baclofen (LIORESAL) 10 MG tablet Take 10 mg by mouth 2 (two) times daily as needed.    . DULoxetine (CYMBALTA) 30 MG capsule Take 30 mg by mouth 2 (two) times daily.    Marland Kitchen ibuprofen (ADVIL) 600 MG tablet Take 1 tablet (600 mg total) by mouth every 6 (six) hours as needed. (Patient not taking: Reported on 09/05/2019) 30 tablet 3  . ondansetron (ZOFRAN-ODT) 8 MG disintegrating tablet Take 8 mg by mouth 2 (two) times daily as needed.    Marland Kitchen oxyCODONE-acetaminophen (PERCOCET) 10-325 MG tablet Take 1 tablet by mouth 3 (three) times daily as needed.     No facility-administered medications prior to visit.    No Known Allergies  ROS Review of Systems  Constitutional: Negative.   HENT: Negative.   Eyes: Negative.   Respiratory: Negative.   Cardiovascular: Negative.   Gastrointestinal: Negative.   Endocrine: Negative.   Genitourinary: Negative.   Musculoskeletal: Positive for arthralgias (genalized  joint pain ).  Skin: Negative.   Allergic/Immunologic: Negative.   Neurological: Positive for dizziness (occasional ) and headaches (occasional ).  Hematological: Negative.   Psychiatric/Behavioral: Negative.       Objective:    Physical Exam Vitals and nursing note reviewed.  Constitutional:      Appearance: Normal appearance.  HENT:     Head: Normocephalic and atraumatic.     Nose: Nose normal.     Mouth/Throat:     Mouth: Mucous membranes are moist.     Pharynx: Oropharynx is clear.  Cardiovascular:     Rate and Rhythm: Normal rate and regular rhythm.  Pulses: Normal pulses.     Heart sounds: Normal heart sounds.  Pulmonary:     Effort: Pulmonary effort is normal.     Breath sounds: Normal breath sounds.  Abdominal:     General: Bowel sounds are normal.     Palpations: Abdomen is soft.  Musculoskeletal:        General: Normal range of motion.     Cervical back: Normal range of motion and neck supple.       Feet:  Skin:    General: Skin is warm.  Neurological:     General: No focal deficit present.     Mental Status: She is alert.  Psychiatric:        Mood and Affect: Mood normal.        Behavior: Behavior normal.        Thought Content: Thought content normal.        Judgment: Judgment normal.     BP (!) 144/79 (BP Location: Left Arm, Patient Position: Sitting, Cuff Size: Small)   Pulse 82   Temp 97.9 F (36.6 C)   Ht 5\' 6"  (1.676 m)   Wt 165 lb 0.2 oz (74.8 kg)   LMP 06/13/2011   SpO2 99%   BMI 26.63 kg/m  Wt Readings from Last 3 Encounters:  09/05/19 165 lb 0.2 oz (74.8 kg)  02/24/19 163 lb 12.8 oz (74.3 kg)  01/22/19 158 lb 3.2 oz (71.8 kg)     Health Maintenance Due  Topic Date Due  . COVID-19 Vaccine (1) Never done  . COLONOSCOPY  Never done  . MAMMOGRAM  12/19/2017    There are no preventive care reminders to display for this patient.  Lab Results  Component Value Date   TSH 1.030 09/14/2018   Lab Results  Component Value  Date   WBC 4.4 09/14/2018   HGB 13.9 09/14/2018   HCT 43.6 09/14/2018   MCV 95 09/14/2018   PLT 285 09/14/2018   Lab Results  Component Value Date   NA 142 09/14/2018   K 3.7 09/14/2018   CO2 23 09/14/2018   GLUCOSE 87 09/14/2018   BUN 12 09/14/2018   CREATININE 0.87 09/14/2018   BILITOT 0.4 09/14/2018   ALKPHOS 85 09/14/2018   AST 14 09/14/2018   ALT 14 09/14/2018   PROT 6.3 09/14/2018   ALBUMIN 3.9 09/14/2018   CALCIUM 9.3 09/14/2018   ANIONGAP 6 09/02/2015   Lab Results  Component Value Date   CHOL 211 (H) 09/14/2018   Lab Results  Component Value Date   HDL 42 09/14/2018   Lab Results  Component Value Date   LDLCALC 154 (H) 09/14/2018   Lab Results  Component Value Date   TRIG 75 09/14/2018   Lab Results  Component Value Date   CHOLHDL 5.0 (H) 09/14/2018   Lab Results  Component Value Date   HGBA1C 5.4 09/14/2018   Assessment & Plan:   1. Essential hypertension The current medical regimen is effective; blood pressure is stable at 144/79 today; continue present plan and medications as prescribed. She will continue to take medications as prescribed, to decrease high sodium intake, excessive alcohol intake, increase potassium intake, smoking cessation, and increase physical activity of at least 30 minutes of cardio activity daily. She will continue to follow Heart Healthy or DASH diet. - Urinalysis Dipstick  2. Plantar warts - Ambulatory referral to Podiatry  3. Encounter for screening mammogram for malignant neoplasm of breast - MM DIGITAL SCREENING BILATERAL  4.  Hot flashes Stable today. Not worsening.   5. Lumbar back pain with radiculopathy affecting right lower extremity  6. Follow up She will follow up in 6 months.  No orders of the defined types were placed in this encounter.  Orders Placed This Encounter  Procedures  . MM DIGITAL SCREENING BILATERAL  . Ambulatory referral to Podiatry  . Urinalysis Dipstick     Referral Orders      Ambulatory referral to [place],  MSN, FNP-BC [facility] [address] [place], [place] [address] [phone] fax  Problem List Items Addressed This Visit      Cardiovascular and Mediastinum   Essential hypertension - Primary   Relevant Orders   Urinalysis Dipstick (Completed)   Hot flashes    Other Visit Diagnoses    Plantar warts       Relevant Orders   Ambulatory referral to Podiatry   Encounter for screening mammogram for malignant neoplasm of breast       Relevant Orders   MM DIGITAL SCREENING BILATERAL   Lumbar back pain with radiculopathy affecting right lower extremity       Relevant Medications   baclofen (LIORESAL) 10 MG tablet   DULoxetine (CYMBALTA) 30 MG capsule   oxyCODONE-acetaminophen (PERCOCET) 10-325 MG tablet   Follow up          No orders of the defined types were placed in this encounter.   Follow-up: Return in about 6 months (around 03/06/2020).    Azzie Glatter, FNP

## 2019-09-10 ENCOUNTER — Encounter: Payer: Self-pay | Admitting: Family Medicine

## 2019-09-10 DIAGNOSIS — B070 Plantar wart: Secondary | ICD-10-CM | POA: Insufficient documentation

## 2019-09-21 ENCOUNTER — Other Ambulatory Visit: Payer: Self-pay | Admitting: Podiatry

## 2019-09-21 ENCOUNTER — Ambulatory Visit (INDEPENDENT_AMBULATORY_CARE_PROVIDER_SITE_OTHER): Payer: 59

## 2019-09-21 ENCOUNTER — Other Ambulatory Visit: Payer: Self-pay

## 2019-09-21 ENCOUNTER — Ambulatory Visit (INDEPENDENT_AMBULATORY_CARE_PROVIDER_SITE_OTHER): Payer: 59 | Admitting: Podiatry

## 2019-09-21 VITALS — Temp 96.9°F

## 2019-09-21 DIAGNOSIS — M7752 Other enthesopathy of left foot: Secondary | ICD-10-CM | POA: Diagnosis not present

## 2019-09-21 DIAGNOSIS — M21962 Unspecified acquired deformity of left lower leg: Secondary | ICD-10-CM

## 2019-09-21 DIAGNOSIS — Z86718 Personal history of other venous thrombosis and embolism: Secondary | ICD-10-CM | POA: Diagnosis not present

## 2019-09-21 DIAGNOSIS — M778 Other enthesopathies, not elsewhere classified: Secondary | ICD-10-CM

## 2019-09-21 DIAGNOSIS — M25572 Pain in left ankle and joints of left foot: Secondary | ICD-10-CM

## 2019-09-21 NOTE — Progress Notes (Signed)
  Subjective:  Patient ID: [name], female    DOB: [birth year],  MRN: [id number]  Chief Complaint  Patient presents with  . Skin Problem    Painful lesion - L plantar forefoot submet 5. Pt stated, "It never improved. 10/10 burning pain a few times per day. The injection that I had a few years ago didn't help".    59 y.o. female presents with the above complaint. History confirmed with patient.   Objective:  Physical Exam: warm, good capillary refill, no trophic changes or ulcerative lesions, normal DP and PT pulses and normal sensory exam. Left Foot: POP left 5th MPJ with prominent 5th met head, hyperkeratosis   No images are attached to the encounter.  Radiographs: X-ray of the left foot: no fracture, dislocation, swelling or degenerative changes noted Assessment:   1. Capsulitis of metatarsophalangeal (MTP) joint of left foot   2. Metatarsal deformity, left   3. History of DVT (deep vein thrombosis)   4. Pain in joint of left foot    Plan:  Patient was evaluated and treated and all questions answered.  Capsulitis -Lesion pared, courtesy -Discussed excision of the 5th metatarsal head for relief of the lesion.  -Patient has failed all conservative therapy and wishes to proceed with surgical intervention. All risks, benefits, and alternatives discussed with patient. No guarantees given. Consent reviewed and signed by patient. -Planned procedures: Left 5th metatarsal head excision.  -Risk factors: previous DVT. Will do Xarelto after surgery (patient refuses lovenox).  No follow-ups on file.

## 2019-11-10 ENCOUNTER — Telehealth: Payer: Self-pay

## 2019-11-10 NOTE — Telephone Encounter (Signed)
DOS 11/15/2019  METATARSAL OSTEOTOMY RES LT - 28113  [employer] MEDICARE EFFECTIVE DATE - 03/10/2019  PLAN DEDUCTIBLE - $198.00 W/ $0.00 REMAINING OUT OF POCKET - $0.00 COPAY $0.00 COINSURANCE - 20%  Notification or Prior Authorization is not required for the requested services  Decision ID #:[id number]

## 2019-11-15 ENCOUNTER — Other Ambulatory Visit: Payer: Self-pay | Admitting: Podiatry

## 2019-11-15 ENCOUNTER — Encounter: Payer: Self-pay | Admitting: Podiatry

## 2019-11-15 DIAGNOSIS — M21542 Acquired clubfoot, left foot: Secondary | ICD-10-CM | POA: Diagnosis not present

## 2019-11-15 MED ORDER — CEPHALEXIN 500 MG PO CAPS: 500.0000 mg | ORAL_CAPSULE | Freq: Two times a day (BID) | ORAL | 0 refills | Status: DC

## 2019-11-15 MED ORDER — ONDANSETRON HCL 4 MG PO TABS: 4.0000 mg | ORAL_TABLET | Freq: Three times a day (TID) | ORAL | 0 refills | Status: DC | PRN

## 2019-11-15 MED ORDER — HYDROCODONE-ACETAMINOPHEN 5-325 MG PO TABS: 1.0000 | ORAL_TABLET | Freq: Four times a day (QID) | ORAL | 0 refills | Status: DC | PRN

## 2019-11-15 NOTE — Progress Notes (Signed)
Rx sent to pharmacy for outpatient surgery. °

## 2019-11-21 ENCOUNTER — Ambulatory Visit (INDEPENDENT_AMBULATORY_CARE_PROVIDER_SITE_OTHER): Payer: 59

## 2019-11-21 ENCOUNTER — Other Ambulatory Visit: Payer: Self-pay

## 2019-11-21 ENCOUNTER — Ambulatory Visit (INDEPENDENT_AMBULATORY_CARE_PROVIDER_SITE_OTHER): Payer: 59 | Admitting: Podiatry

## 2019-11-21 ENCOUNTER — Other Ambulatory Visit: Payer: Self-pay | Admitting: Podiatry

## 2019-11-21 DIAGNOSIS — M21962 Unspecified acquired deformity of left lower leg: Secondary | ICD-10-CM | POA: Diagnosis not present

## 2019-11-21 DIAGNOSIS — Z9889 Other specified postprocedural states: Secondary | ICD-10-CM

## 2019-11-21 MED ORDER — RIVAROXABAN 10 MG PO TABS: 10.0000 mg | ORAL_TABLET | Freq: Every day | ORAL | 0 refills | Status: DC

## 2019-11-21 NOTE — Progress Notes (Signed)
Rx sent for Xarelto, spoke with patient to inform.

## 2019-11-21 NOTE — Progress Notes (Signed)
  Subjective:  Patient ID: [name], female    DOB: [birth year],  MRN: [id number]  Chief Complaint  Patient presents with  . Routine Post Op    POV#1 11/15/2019 LT FIFTH METATARSAL HEAD EXCISION. Pt denies fever/nausea/vomiting/chills and states her pain is manageable. No concerns.   DOS: 11/15/19 Procedure: Left 5th metatarsal exostectomy   59 y.o. female presents with the above complaint. History confirmed with patient.   Objective:  Physical Exam: tenderness at the surgical site, local edema noted and calf supple, nontender. Incision: healing well, no significant drainage, no dehiscence, no significant erythema  No images are attached to the encounter.  Radiographs: X-ray of the left foot: consistent with post-op state   Assessment:   1. Metatarsal deformity, left   2. Post-operative state     Plan:  Patient was evaluated and treated and all questions answered.  Post-operative State -XR reviewed with patient -Dressing applied consisting of sterile gauze, kerlix and ACE bandage -WBAT in Surgical shoe -Surgical shoe dispensed  No follow-ups on file.

## 2019-11-28 ENCOUNTER — Encounter: Payer: 59 | Admitting: Podiatry

## 2019-11-28 ENCOUNTER — Other Ambulatory Visit: Payer: Self-pay

## 2019-11-28 ENCOUNTER — Ambulatory Visit (INDEPENDENT_AMBULATORY_CARE_PROVIDER_SITE_OTHER): Payer: 59 | Admitting: Podiatry

## 2019-11-28 DIAGNOSIS — Z9889 Other specified postprocedural states: Secondary | ICD-10-CM

## 2019-11-28 DIAGNOSIS — M21962 Unspecified acquired deformity of left lower leg: Secondary | ICD-10-CM

## 2019-12-05 ENCOUNTER — Encounter: Payer: 59 | Admitting: Podiatry

## 2019-12-07 NOTE — Progress Notes (Signed)
  Subjective:  Patient ID: [name], female    DOB: October 22, 1960,  MRN: [id number]  No chief complaint on file.  DOS: 11/15/19 Procedure: Left 5th metatarsal exostectomy   59 y.o. female presents with the above complaint. History confirmed with patient.  Denies any postop issues or concerns  Objective:  Physical Exam: tenderness at the surgical site, local edema noted and calf supple, nontender. Incision: healing well, no significant drainage, no dehiscence, no significant erythema  No images are attached to the encounter.  Radiographs: X-ray of the left foot: consistent with post-op state   Assessment:   1. Metatarsal deformity, left   2. Post-operative state     Plan:  Patient was evaluated and treated and all questions answered.  Post-operative State -Sutures removed -Steri-strips applied to the incision -Ok to start showering at this time. Advised they cannot soak. -WBAT in Surgical shoe  Return in about 2 weeks (around 12/12/2019) for Post-Op (No XRs).

## 2019-12-12 ENCOUNTER — Ambulatory Visit (INDEPENDENT_AMBULATORY_CARE_PROVIDER_SITE_OTHER): Payer: 59 | Admitting: Podiatry

## 2019-12-12 DIAGNOSIS — Z5329 Procedure and treatment not carried out because of patient's decision for other reasons: Secondary | ICD-10-CM

## 2019-12-12 NOTE — Progress Notes (Signed)
No show for appt. 

## 2020-03-06 ENCOUNTER — Ambulatory Visit: Payer: 59 | Admitting: Family Medicine

## 2020-08-08 NOTE — Progress Notes (Signed)
Patient did not did not show for appointment.

## 2020-08-09 ENCOUNTER — Encounter: Payer: 59 | Admitting: Family

## 2020-08-09 ENCOUNTER — Other Ambulatory Visit: Payer: Self-pay

## 2020-08-09 DIAGNOSIS — Z7689 Persons encountering health services in other specified circumstances: Secondary | ICD-10-CM
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
## 2021-09-03 ENCOUNTER — Ambulatory Visit: Payer: 59 | Admitting: Nurse Practitioner

## 2021-10-08 ENCOUNTER — Ambulatory Visit: Payer: 59 | Admitting: Nurse Practitioner

## 2022-01-15 ENCOUNTER — Encounter: Payer: 59 | Admitting: Adult Health

## 2022-01-18 NOTE — Progress Notes (Signed)
This encounter was created in error - please disregard.

## 2022-01-26 ENCOUNTER — Encounter: Payer: Self-pay | Admitting: Adult Health

## 2022-01-26 ENCOUNTER — Other Ambulatory Visit: Payer: Self-pay

## 2022-01-26 ENCOUNTER — Ambulatory Visit (INDEPENDENT_AMBULATORY_CARE_PROVIDER_SITE_OTHER): Payer: 59 | Admitting: Adult Health

## 2022-01-26 ENCOUNTER — Other Ambulatory Visit (HOSPITAL_COMMUNITY): Payer: Self-pay

## 2022-01-26 VITALS — BP 140/64 | HR 82 | Resp 20 | Ht 65.0 in | Wt 174.0 lb

## 2022-01-26 DIAGNOSIS — Z13820 Encounter for screening for osteoporosis: Secondary | ICD-10-CM

## 2022-01-26 DIAGNOSIS — Z1211 Encounter for screening for malignant neoplasm of colon: Secondary | ICD-10-CM

## 2022-01-26 DIAGNOSIS — M5010 Cervical disc disorder with radiculopathy, unspecified cervical region: Secondary | ICD-10-CM

## 2022-01-26 DIAGNOSIS — Z7689 Persons encountering health services in other specified circumstances: Secondary | ICD-10-CM

## 2022-01-26 DIAGNOSIS — E782 Mixed hyperlipidemia: Secondary | ICD-10-CM

## 2022-01-26 DIAGNOSIS — M5442 Lumbago with sciatica, left side: Secondary | ICD-10-CM | POA: Diagnosis not present

## 2022-01-26 DIAGNOSIS — E559 Vitamin D deficiency, unspecified: Secondary | ICD-10-CM

## 2022-01-26 DIAGNOSIS — Z86718 Personal history of other venous thrombosis and embolism: Secondary | ICD-10-CM

## 2022-01-26 DIAGNOSIS — Z122 Encounter for screening for malignant neoplasm of respiratory organs: Secondary | ICD-10-CM | POA: Diagnosis not present

## 2022-01-26 DIAGNOSIS — I10 Essential (primary) hypertension: Secondary | ICD-10-CM

## 2022-01-26 DIAGNOSIS — G8929 Other chronic pain: Secondary | ICD-10-CM

## 2022-01-26 DIAGNOSIS — M5441 Lumbago with sciatica, right side: Secondary | ICD-10-CM

## 2022-01-26 DIAGNOSIS — M5416 Radiculopathy, lumbar region: Secondary | ICD-10-CM

## 2022-01-26 MED ORDER — PRAVASTATIN SODIUM 80 MG PO TABS
80.0000 mg | ORAL_TABLET | Freq: Every day | ORAL | 3 refills | Status: DC
  Filled 2022-01-26 (×2): qty 30, 30d supply, fill #0
  Filled 2022-04-01: qty 30, 30d supply, fill #1
  Filled 2022-05-20: qty 30, 30d supply, fill #2
  Filled 2022-08-27 – 2022-10-09 (×2): qty 30, 30d supply, fill #3

## 2022-01-26 MED ORDER — AMLODIPINE BESYLATE 10 MG PO TABS
10.0000 mg | ORAL_TABLET | Freq: Every day | ORAL | 5 refills | Status: DC
  Filled 2022-01-26 (×2): qty 30, 30d supply, fill #0
  Filled 2022-04-01: qty 30, 30d supply, fill #1
  Filled 2022-05-20: qty 30, 30d supply, fill #2
  Filled 2022-08-27: qty 30, 30d supply, fill #3

## 2022-01-26 MED ORDER — VITAMIN D (ERGOCALCIFEROL) 1.25 MG (50000 UNIT) PO CAPS
50000.0000 [IU] | ORAL_CAPSULE | ORAL | 3 refills | Status: DC
  Filled 2022-01-26: qty 4, 28d supply, fill #0
  Filled 2022-01-26: qty 5, 35d supply, fill #0
  Filled 2022-05-20: qty 5, 35d supply, fill #1
  Filled 2022-08-27 – 2022-10-09 (×2): qty 5, 35d supply, fill #2
  Filled 2023-01-21: qty 5, 35d supply, fill #3

## 2022-01-26 MED ORDER — CYCLOBENZAPRINE HCL 10 MG PO TABS
10.0000 mg | ORAL_TABLET | Freq: Two times a day (BID) | ORAL | 0 refills | Status: DC | PRN
  Filled 2022-01-26 (×2): qty 15, 8d supply, fill #0

## 2022-01-26 MED ORDER — RIVAROXABAN 10 MG PO TABS
10.0000 mg | ORAL_TABLET | Freq: Every day | ORAL | 3 refills | Status: DC
  Filled 2022-01-26 (×2): qty 30, 30d supply, fill #0
  Filled 2022-04-01 – 2022-05-20 (×2): qty 30, 30d supply, fill #1
  Filled 2022-08-27 – 2022-10-09 (×2): qty 30, 30d supply, fill #2
  Filled 2023-01-21: qty 30, 30d supply, fill #3

## 2022-01-26 NOTE — Progress Notes (Signed)
[facility] clinic  Provider: Durenda Age DNP  Code Status:  Full Code  Goals of Care:     01/22/2019    8:58 PM  Advanced Directives  Does Patient Have a Medical Advance Directive? No  Would patient like information on creating a medical advance directive? No - Patient declined     Chief Complaint  Patient presents with   encounter to establish care    Establish care with provider    HPI: Patient is a 61 y.o. female seen today to establish care with [name]. She has a PMH of hypertension, DVT on Xarelto, hyperlipidemia and hypertension. She has not seen a primary care provider for a year. She has 2 sons and 2 girls. Currently, she smokes a pack of cigarettes/week and has been smoking since she was 61 years old.   Past Medical History:  Diagnosis Date   DVT (deep venous thrombosis) (HCC)    Headache(784.0)    otc meds prn    History of hysterectomy 2014   Hyperlipidemia    Hypertension 09/2018   Plantar warts 08/2019   SVD (spontaneous vaginal delivery)    x 5   Vitamin D deficiency 09/2018    Past Surgical History:  Procedure Laterality Date   ABDOMINAL HYSTERECTOMY  04/06/2012   Procedure: HYSTERECTOMY ABDOMINAL;  Surgeon: Margarette Asal, MD;  Location: [facility];  Service: Gynecology;  Laterality: N/A;   BREAST SURGERY     benign turmor removed   SALPINGOOPHORECTOMY  04/06/2012   Procedure: SALPINGO OOPHORECTOMY;  Surgeon: Margarette Asal, MD;  Location: [facility];  Service: Gynecology;  Laterality: Bilateral;   TUBAL LIGATION      No Known Allergies  Outpatient Encounter Medications as of 01/26/2022  Medication Sig   amLODipine (NORVASC) 10 MG tablet Take 1 tablet (10 mg total) by mouth daily.   cephALEXin (KEFLEX) 500 MG capsule Take 1 capsule (500 mg total) by mouth 2 (two) times daily. (Patient not taking: Reported on 01/26/2022)   cyclobenzaprine (FLEXERIL) 10 MG tablet Take 1 tablet (10 mg total) by mouth 2 (two) times daily as needed for muscle spasms.    DULoxetine (CYMBALTA) 30 MG capsule Take 30 mg by mouth 2 (two) times daily. (Patient not taking: Reported on 01/26/2022)   gabapentin (NEURONTIN) 300 MG capsule Take 1 capsule (300 mg total) by mouth 4 (four) times daily. (Patient not taking: Reported on 01/26/2022)   HYDROcodone-acetaminophen (NORCO) 5-325 MG tablet Take 1 tablet by mouth every 6 (six) hours as needed for moderate pain. (Patient not taking: Reported on 01/26/2022)   NARCAN 4 MG/0.1ML LIQD nasal spray kit SMARTSIG:1 Spray(s) Both Nares PRN (Patient not taking: Reported on 01/26/2022)   ondansetron (ZOFRAN) 4 MG tablet Take 1 tablet (4 mg total) by mouth every 8 (eight) hours as needed for nausea or vomiting. (Patient not taking: Reported on 01/26/2022)   ondansetron (ZOFRAN-ODT) 8 MG disintegrating tablet Take 8 mg by mouth 2 (two) times daily as needed. (Patient not taking: Reported on 01/26/2022)   pravastatin (PRAVACHOL) 80 MG tablet Take 1 tablet (80 mg total) by mouth daily.   rivaroxaban (XARELTO) 10 MG TABS tablet Take 1 tablet (10 mg total) by mouth daily.   Vitamin D, Ergocalciferol, (DRISDOL) 1.25 MG (50000 UNIT) CAPS capsule Take 1 capsule (50,000 Units total) by mouth every 7 (seven) days.   [DISCONTINUED] amLODipine (NORVASC) 10 MG tablet Take 1 tablet (10 mg total) by mouth daily. (Patient not taking: Reported on 01/26/2022)   [DISCONTINUED] baclofen (  LIORESAL) 10 MG tablet Take 10 mg by mouth 2 (two) times daily as needed. (Patient not taking: Reported on 01/26/2022)   [DISCONTINUED] cyclobenzaprine (FLEXERIL) 10 MG tablet Take 1 tablet (10 mg total) by mouth 2 (two) times daily as needed for muscle spasms. (Patient not taking: Reported on 01/26/2022)   [DISCONTINUED] ibuprofen (ADVIL) 600 MG tablet Take 1 tablet (600 mg total) by mouth every 6 (six) hours as needed. (Patient not taking: Reported on 01/26/2022)   [DISCONTINUED] oxyCODONE-acetaminophen (PERCOCET) 10-325 MG tablet Take 1 tablet by mouth 3 (three) times  daily as needed. (Patient not taking: Reported on 01/26/2022)   [DISCONTINUED] pravastatin (PRAVACHOL) 80 MG tablet Take 1 tablet (80 mg total) by mouth daily. (Patient not taking: Reported on 01/26/2022)   [DISCONTINUED] rivaroxaban (XARELTO) 10 MG TABS tablet Take 1 tablet (10 mg total) by mouth daily. (Patient not taking: Reported on 01/26/2022)   [DISCONTINUED] Vitamin D, Ergocalciferol, (DRISDOL) 1.25 MG (50000 UNIT) CAPS capsule Take 1 capsule (50,000 Units total) by mouth every 7 (seven) days. (Patient not taking: Reported on 01/26/2022)   No facility-administered encounter medications on file as of 01/26/2022.    Review of Systems:  Review of Systems  Constitutional:  Negative for appetite change, chills, fatigue and fever.  HENT:  Negative for congestion, hearing loss, rhinorrhea and sore throat.   Eyes: Negative.   Respiratory:  Negative for cough, shortness of breath and wheezing.   Cardiovascular:  Negative for chest pain, palpitations and leg swelling.  Gastrointestinal:  Negative for abdominal pain, constipation, diarrhea, nausea and vomiting.  Genitourinary:  Negative for dysuria.  Musculoskeletal:  Negative for arthralgias, back pain and myalgias.  Skin:  Negative for color change, rash and wound.  Neurological:  Negative for dizziness, weakness and headaches.  Psychiatric/Behavioral:  Negative for behavioral problems. The patient is not nervous/anxious.     Health Maintenance  Topic Date Due   Medicare Annual Wellness (AWV)  Never done   COVID-19 Vaccine (1) Never done   COLONOSCOPY (Pts 45-58yr Insurance coverage will need to be confirmed)  Never done   Zoster Vaccines- Shingrix (1 of 2) Never done   MAMMOGRAM  12/19/2017   PAP SMEAR-Modifier  12/03/2019   INFLUENZA VACCINE  Never done   Hepatitis C Screening  Completed   HIV Screening  Completed   HPV VACCINES  Aged Out    Physical Exam: Vitals:   01/26/22 1419  BP: (!) 140/64  Pulse: 82  Resp: 20  SpO2:  97%  Weight: 174 lb (78.9 kg)  Height: _0  (1.651 m)   Body mass index is 28.96 kg/m. Physical Exam Constitutional:      Appearance: Normal appearance.  HENT:     Head: Normocephalic and atraumatic.     Nose: Nose normal.     Mouth/Throat:     Mouth: Mucous membranes are moist.  Eyes:     Conjunctiva/sclera: Conjunctivae normal.  Cardiovascular:     Rate and Rhythm: Normal rate and regular rhythm.  Pulmonary:     Effort: Pulmonary effort is normal.     Breath sounds: Normal breath sounds.  Abdominal:     General: Bowel sounds are normal.     Palpations: Abdomen is soft.  Musculoskeletal:        General: Normal range of motion.     Cervical back: Normal range of motion.  Skin:    General: Skin is warm and dry.  Neurological:     General: No focal deficit present.  Mental Status: She is alert and oriented to person, place, and time.  Psychiatric:        Mood and Affect: Mood normal.        Behavior: Behavior normal.        Thought Content: Thought content normal.        Judgment: Judgment normal.     Labs reviewed: Basic Metabolic Panel: No results for input(s): "NA", "K", "CL", "CO2", "GLUCOSE", "BUN", "CREATININE", "CALCIUM", "MG", "PHOS", "TSH" in the last 8760 hours. Liver Function Tests: No results for input(s): "AST", "ALT", "ALKPHOS", "BILITOT", "PROT", "ALBUMIN" in the last 8760 hours. No results for input(s): "LIPASE", "AMYLASE" in the last 8760 hours. No results for input(s): "AMMONIA" in the last 8760 hours. CBC: No results for input(s): "WBC", "NEUTROABS", "HGB", "HCT", "MCV", "PLT" in the last 8760 hours. Lipid Panel: No results for input(s): "CHOL", "HDL", "LDLCALC", "TRIG", "CHOLHDL", "LDLDIRECT" in the last 8760 hours. Lab Results  Component Value Date   HGBA1C 5.4 09/14/2018    Procedures since last visit: No results found.  Assessment/Plan  1. Encounter to establish care - CBC With Differential/Platelet; Future - CMP with  eGFR([facility]); Future - Lipid panel; Future - Hemoglobin A1C; Future - Hep B Surface Antibody; Future - Hepatitis C Antibody; Future - HIV antibody (with reflex); Future - Sexually-Transmitted Infections (STIs) Increased Risk Panel  2. Chronic bilateral low back pain with bilateral sciatica -  takes Cyclobenzaprine PRN - Ambulatory referral to Pain Clinic - CBC With Differential/Platelet; Future - CMP with eGFR([facility]); Future - DG Lumbar Spine Complete; Future  3. Encounter for screening for lung cancer -  has been smoking since she was told years old and currently smokes a pack/week - CT CHEST LUNG CA SCREEN LOW DOSE W/O CM; Future  4. Encounter for screening for osteoporosis - DG BONE DENSITY (DXA); Future  5. Colon cancer screening - Ambulatory referral to Gastroenterology  6. Primary hypertension -  BP 140/64 -  instructed to check BP and log daily - amLODipine (NORVASC) 10 MG tablet; Take 1 tablet (10 mg total) by mouth daily.  Dispense: 30 tablet; Refill: 5  7. History of DVT (deep vein thrombosis) - rivaroxaban (XARELTO) 10 MG TABS tablet; Take 1 tablet (10 mg total) by mouth daily.  Dispense: 30 tablet; Refill: 3  8. Mixed hyperlipidemia - pravastatin (PRAVACHOL) 80 MG tablet; Take 1 tablet (80 mg total) by mouth daily.  Dispense: 30 tablet; Refill: 3  9. Essential hypertension - amLODipine (NORVASC) 10 MG tablet; Take 1 tablet (10 mg total) by mouth daily.  Dispense: 30 tablet; Refill: 5  10. Cervical disc disorder with radiculopathy of cervical region - cyclobenzaprine (FLEXERIL) 10 MG tablet; Take 1 tablet (10 mg total) by mouth 2 (two) times daily as needed for muscle spasms.  Dispense: 15 tablet; Refill: 0  11. Lumbar back pain with radiculopathy affecting right lower extremity - cyclobenzaprine (FLEXERIL) 10 MG tablet; Take 1 tablet (10 mg total) by mouth 2 (two) times daily as needed for muscle spasms.  Dispense: 15 tablet; Refill: 0  12. Vitamin D  deficiency - Vitamin D, Ergocalciferol, (DRISDOL) 1.25 MG (50000 UNIT) CAPS capsule; Take 1 capsule (50,000 Units total) by mouth every 7 (seven) days.  Dispense: 5 capsule; Refill: 3   Labs/tests ordered:  - CBC With Differential/Platelet; Future - CMP with eGFR([facility]); Future - Lipid panel; Future - Hemoglobin A1C; Future - Hep B Surface Antibody; Future - Hepatitis C Antibody; Future - HIV antibody (with reflex); Future - Sexually-Transmitted  Infections (STIs) Increased Risk Panel -  CT CHEST LUNG CA SCREEN LOW DOSE W/O CM -  DG Lumbar Spine Complete   Next appt:  Visit date not found

## 2022-01-26 NOTE — Patient Instructions (Signed)
Preventive Care 61-61 Years Old, Female Preventive care refers to lifestyle choices and visits with your health care provider that can promote health and wellness. Preventive care visits are also called wellness exams. What can I expect for my preventive care visit? Counseling Your health care provider may ask you questions about your: Medical history, including: Past medical problems. Family medical history. Pregnancy history. Current health, including: Menstrual cycle. Method of birth control. Emotional well-being. Home life and relationship well-being. Sexual activity and sexual health. Lifestyle, including: Alcohol, nicotine or tobacco, and drug use. Access to firearms. Diet, exercise, and sleep habits. Work and work Statistician. Sunscreen use. Safety issues such as seatbelt and bike helmet use. Physical exam Your health care provider will check your: Height and weight. These may be used to calculate your BMI (body mass index). BMI is a measurement that tells if you are at a healthy weight. Waist circumference. This measures the distance around your waistline. This measurement also tells if you are at a healthy weight and may help predict your risk of certain diseases, such as type 2 diabetes and high blood pressure. Heart rate and blood pressure. Body temperature. Skin for abnormal spots. What immunizations do I need?  Vaccines are usually given at various ages, according to a schedule. Your health care provider will recommend vaccines for you based on your age, medical history, and lifestyle or other factors, such as travel or where you work. What tests do I need? Screening Your health care provider may recommend screening tests for certain conditions. This may include: Lipid and cholesterol levels. Diabetes screening. This is done by checking your blood sugar (glucose) after you have not eaten for a while (fasting). Pelvic exam and Pap test. Hepatitis B test. Hepatitis C  test. HIV (human immunodeficiency virus) test. STI (sexually transmitted infection) testing, if you are at risk. Lung cancer screening. Colorectal cancer screening. Mammogram. Talk with your health care provider about when you should start having regular mammograms. This may depend on whether you have a family history of breast cancer. BRCA-related cancer screening. This may be done if you have a family history of breast, ovarian, tubal, or peritoneal cancers. Bone density scan. This is done to screen for osteoporosis. Talk with your health care provider about your test results, treatment options, and if necessary, the need for more tests. Follow these instructions at home: Eating and drinking  Eat a diet that includes fresh fruits and vegetables, whole grains, lean protein, and low-fat dairy products. Take vitamin and mineral supplements as recommended by your health care provider. Do not drink alcohol if: Your health care provider tells you not to drink. You are pregnant, may be pregnant, or are planning to become pregnant. If you drink alcohol: Limit how much you have to 0-1 drink a day. Know how much alcohol is in your drink. In the [place] one drink equals one 12 oz bottle of beer (355 mL), one 5 oz glass of wine (148 mL), or one 1 oz glass of hard liquor (44 mL). Lifestyle Brush your teeth every morning and night with fluoride toothpaste. Floss one time each day. Exercise for at least 30 minutes 5 or more days each week. Do not use any products that contain nicotine or tobacco. These products include cigarettes, chewing tobacco, and vaping devices, such as e-cigarettes. If you need help quitting, ask your health care provider. Do not use drugs. If you are sexually active, practice safe sex. Use a condom or other form of protection to  prevent STIs. If you do not wish to become pregnant, use a form of birth control. If you plan to become pregnant, see your health care provider for a  prepregnancy visit. Take aspirin only as told by your health care provider. Make sure that you understand how much to take and what form to take. Work with your health care provider to find out whether it is safe and beneficial for you to take aspirin daily. Find healthy ways to manage stress, such as: Meditation, yoga, or listening to music. Journaling. Talking to a trusted person. Spending time with friends and family. Minimize exposure to UV radiation to reduce your risk of skin cancer. Safety Always wear your seat belt while driving or riding in a vehicle. Do not drive: If you have been drinking alcohol. Do not ride with someone who has been drinking. When you are tired or distracted. While texting. If you have been using any mind-altering substances or drugs. Wear a helmet and other protective equipment during sports activities. If you have firearms in your house, make sure you follow all gun safety procedures. Seek help if you have been physically or sexually abused. What's next? Visit your health care provider once a year for an annual wellness visit. Ask your health care provider how often you should have your eyes and teeth checked. Stay up to date on all vaccines. This information is not intended to replace advice given to you by your health care provider. Make sure you discuss any questions you have with your health care provider. Document Revised: 08/21/2020 Document Reviewed: 08/21/2020 Elsevier Patient Education  [place].

## 2022-01-27 ENCOUNTER — Other Ambulatory Visit: Payer: Self-pay

## 2022-01-27 ENCOUNTER — Other Ambulatory Visit: Payer: 59

## 2022-01-27 DIAGNOSIS — Z7689 Persons encountering health services in other specified circumstances: Secondary | ICD-10-CM

## 2022-01-27 DIAGNOSIS — G8929 Other chronic pain: Secondary | ICD-10-CM

## 2022-02-02 ENCOUNTER — Other Ambulatory Visit: Payer: Self-pay

## 2022-02-03 ENCOUNTER — Other Ambulatory Visit: Payer: 59

## 2022-02-04 NOTE — Progress Notes (Signed)
-    hgbA1c 5.7, prediabetic, will need to be on low carb diet, regular exercise -  Negative for Hep C, HIV, Hep B -  cholesterol 237, elevated (normal <200), LDL 170, elevated (normal <100), can discuss management on next visit, 02/09/22

## 2022-02-05 LAB — COMPLETE METABOLIC PANEL WITH GFR
AG Ratio: 1.6 (calc) (ref 1.0–2.5)
ALT: 11 U/L (ref 6–29)
AST: 11 U/L (ref 10–35)
Albumin: 4.1 g/dL (ref 3.6–5.1)
Alkaline phosphatase (APISO): 73 U/L (ref 37–153)
BUN: 20 mg/dL (ref 7–25)
CO2: 28 mmol/L (ref 20–32)
Calcium: 9.6 mg/dL (ref 8.6–10.4)
Chloride: 110 mmol/L (ref 98–110)
Creat: 0.95 mg/dL (ref 0.50–1.05)
Globulin: 2.6 g/dL (calc) (ref 1.9–3.7)
Glucose, Bld: 95 mg/dL (ref 65–99)
Potassium: 3.9 mmol/L (ref 3.5–5.3)
Sodium: 144 mmol/L (ref 135–146)
Total Bilirubin: 0.3 mg/dL (ref 0.2–1.2)
Total Protein: 6.7 g/dL (ref 6.1–8.1)
eGFR: 68 mL/min/{1.73_m2} (ref >=60)

## 2022-02-05 LAB — CBC WITH DIFFERENTIAL/PLATELET
Absolute Monocytes: 300 cells/uL (ref 200–950)
Basophils Absolute: 18 cells/uL (ref 0–200)
Basophils Relative: 0.3 %
Eosinophils Absolute: 78 cells/uL (ref 15–500)
Eosinophils Relative: 1.3 %
HCT: 40.1 % (ref 35.0–45.0)
Hemoglobin: 12.9 g/dL (ref 11.7–15.5)
Lymphs Abs: 1392 cells/uL (ref 850–3900)
MCH: 28.9 pg (ref 27.0–33.0)
MCHC: 32.2 g/dL (ref 32.0–36.0)
MCV: 89.9 fL (ref 80.0–100.0)
MPV: 10.9 fL (ref 7.5–12.5)
Monocytes Relative: 5 %
Neutro Abs: 4212 cells/uL (ref 1500–7800)
Neutrophils Relative %: 70.2 %
Platelets: 309 10*3/uL (ref 140–400)
RBC: 4.46 10*6/uL (ref 3.80–5.10)
RDW: 12.3 % (ref 11.0–15.0)
Total Lymphocyte: 23.2 %
WBC: 6 10*3/uL (ref 3.8–10.8)

## 2022-02-05 LAB — LIPID PANEL
Cholesterol: 237 mg/dL — ABNORMAL HIGH (ref <200)
HDL: 45 mg/dL — ABNORMAL LOW (ref >=50)
LDL Cholesterol (Calc): 170 mg/dL (calc) — ABNORMAL HIGH
Non-HDL Cholesterol (Calc): 192 mg/dL (calc) — ABNORMAL HIGH (ref <130)
Total CHOL/HDL Ratio: 5.3 (calc) — ABNORMAL HIGH (ref <5.0)
Triglycerides: 103 mg/dL (ref <150)

## 2022-02-05 LAB — SEXUALLY-TRANSMITTED INFECTIONS (STIS) INCREASED RISK PANEL
CHLAMYDIA TRACHOMATIS RNA, TMA, UROGENITAL: NOT DETECTED
MYCOPLASMA GENITALIUM, rRNA, TMA: NOT DETECTED
NEISSERIA GONORRHOEAE RNA, TMA, UROGENITAL: NOT DETECTED
TRICHOMONAS VAGINALIS RNA, QL TMA: DETECTED — AB

## 2022-02-05 LAB — HEMOGLOBIN A1C
Hgb A1c MFr Bld: 5.7 % of total Hgb — ABNORMAL HIGH (ref <5.7)
Mean Plasma Glucose: 117 mg/dL
eAG (mmol/L): 6.5 mmol/L

## 2022-02-05 LAB — HEPATITIS B SURFACE ANTIBODY,QUALITATIVE: Hep B S Ab: NONREACTIVE

## 2022-02-05 LAB — HIV ANTIBODY (ROUTINE TESTING W REFLEX): HIV 1&2 Ab, 4th Generation: NONREACTIVE

## 2022-02-05 LAB — HEPATITIS C ANTIBODY: Hepatitis C Ab: NONREACTIVE

## 2022-02-09 ENCOUNTER — Ambulatory Visit (INDEPENDENT_AMBULATORY_CARE_PROVIDER_SITE_OTHER): Payer: 59 | Admitting: Adult Health

## 2022-02-09 ENCOUNTER — Encounter: Payer: Self-pay | Admitting: Adult Health

## 2022-02-09 ENCOUNTER — Other Ambulatory Visit: Payer: Self-pay

## 2022-02-09 VITALS — BP 128/72 | HR 77 | Temp 97.7°F | Resp 18 | Ht 65.0 in | Wt 175.0 lb

## 2022-02-09 DIAGNOSIS — M25562 Pain in left knee: Secondary | ICD-10-CM

## 2022-02-09 DIAGNOSIS — I10 Essential (primary) hypertension: Secondary | ICD-10-CM

## 2022-02-09 DIAGNOSIS — A599 Trichomoniasis, unspecified: Secondary | ICD-10-CM | POA: Diagnosis not present

## 2022-02-09 DIAGNOSIS — M25561 Pain in right knee: Secondary | ICD-10-CM

## 2022-02-09 DIAGNOSIS — G8929 Other chronic pain: Secondary | ICD-10-CM

## 2022-02-09 MED ORDER — METRONIDAZOLE 500 MG PO TABS
500.0000 mg | ORAL_TABLET | Freq: Two times a day (BID) | ORAL | 0 refills | Status: DC
  Filled 2022-02-09: qty 14, 7d supply, fill #0

## 2022-02-09 NOTE — Patient Instructions (Signed)
Trichomoniasis Trichomoniasis is a sexually transmitted infection (STI). Many people with trichomoniasis do not have any symptoms (are asymptomatic) or have only minimal symptoms. Untreated trichomoniasis can last from months to years. This condition is treated with medicine. What are the causes? This condition is caused by a parasite called Trichomonas vaginalis and is transmitted during sexual contact. What increases the risk? The following factors may make you more likely to develop this condition: Having unprotected sex. Having sex with a partner who has trichomoniasis. Having multiple sexual partners. Having had previous trichomoniasis infections or other STIs. What are the signs or symptoms? In females, symptoms of trichomoniasis include: Itching, burning, redness, or soreness in the genital area. Discomfort while urinating. Abnormal vaginal discharge that is clear, white, gray, or yellow-green and foamy and has an unusual fishy odor. In males, symptoms of trichomoniasis include: Discharge from the penis. Burning after urination or ejaculation. Itching or discomfort inside the penis. How is this diagnosed? This condition is diagnosed based on tests. To perform a test, your health care provider will do one of the following: Ask you to provide a urine sample. Take a sample of discharge. The sample may be taken from the vagina or cervix in females and from the urethra in males. Your health care provider may use a swab to collect the sample. Your health care provider may test you for other STIs, including human immunodeficiency virus (HIV). How is this treated?  This condition is treated with medicines such as metronidazole or tinidazole. These are called antimicrobial medicines, and they are taken by mouth (orally). Your sexual partner or partners also need to be tested and treated. If they have the infection and are not treated, you will likely get reinfected. If you plan to become  pregnant or think you may be pregnant, tell your health care provider right away. Some medicines that are used to treat the infection should not be taken during pregnancy. Your health care provider may recommend over-the-counter medicines or creams to help relieve itching or irritation. You may be tested for the infection again 3 months after treatment. Follow these instructions at home: Medicines Take over-the-counter and prescription medicines only as told by your health care provider. Take your antimicrobial medicine as told by your health care provider. Do not stop taking it even if you start to feel better. Use creams as told by your health care provider. General instructions Do not have sex until after you finish your medicine and your symptoms have resolved. Do not wear tampons while you have the infection (if you are female). Talk with your sexual partner or partners about any symptoms that either of you may have, as well as any history of STIs. Keep all follow-up visits. This is important. How is this prevented?  Use condoms every time you have sex. Using condoms correctly and consistently can help protect against STIs. Do not have sexual contact if you have symptoms of trichomoniasis or another STI. Avoid having multiple sexual partners. Get tested for STIs before you have sex with a partner. Ask all partners to do the same. Do not douche (if you are female). Douching may increase your risk for getting STIs due to the removal of good bacteria in the vagina. Contact a health care provider if: You still have symptoms after you finish your medicine. You develop a rash. You plan to become pregnant or think you may be pregnant. Summary Trichomoniasis is a sexually transmitted infection (STI). This condition often has no symptoms or minimal   symptoms. Take your antimicrobial medicine as told by your health care provider. Do not stop taking even if you start to feel better. Discuss your  infection with your sexual partner or partners. Make sure that all partners get tested and treated, if necessary. You should not have sex until after you finish your medicine and your symptoms have resolved. Keep all follow-up visits. This is important. This information is not intended to replace advice given to you by your health care provider. Make sure you discuss any questions you have with your health care provider. Document Revised: 01/22/2021 Document Reviewed: 01/22/2021 Elsevier Patient Education  2023 [employer]  

## 2022-02-09 NOTE — Progress Notes (Signed)
[name]  Provider:  Durenda Age DNP  Code Status:  Full Code  Goals of Care:     01/22/2019    8:58 PM  Advanced Directives  Does Patient Have a Medical Advance Directive? No  Would patient like information on creating a medical advance directive? No - Patient declined     Chief Complaint  Patient presents with   Acute Visit    Patient is here for 2 week follow up    HPI: Patient is a 61 y.o. female seen today for an acute visit for lab results. Trichomonas vaginalis was detected in her STD screening. She denies vaginal discharge, dyspareunia nor pruritus. She stated that she only has one partner and has been diagnosed with trichomonas vaginalis before. S  She complained about having a nodule on her knee.  BP 128/72, takes Amlodipine for hypertension   Past Medical History:  Diagnosis Date   DVT (deep venous thrombosis) (HCC)    Headache(784.0)    otc meds prn    History of hysterectomy 2014   Hyperlipidemia    Hypertension 09/2018   Plantar warts 08/2019   SVD (spontaneous vaginal delivery)    x 5   Vitamin D deficiency 09/2018    Past Surgical History:  Procedure Laterality Date   ABDOMINAL HYSTERECTOMY  04/06/2012   Procedure: HYSTERECTOMY ABDOMINAL;  Surgeon: Margarette Asal, MD;  Location: [facility];  Service: Gynecology;  Laterality: N/A;   BREAST SURGERY     benign turmor removed   SALPINGOOPHORECTOMY  04/06/2012   Procedure: SALPINGO OOPHORECTOMY;  Surgeon: Margarette Asal, MD;  Location: [facility];  Service: Gynecology;  Laterality: Bilateral;   TUBAL LIGATION      No Known Allergies  Outpatient Encounter Medications as of 02/09/2022  Medication Sig   [DISCONTINUED] metroNIDAZOLE (FLAGYL) 500 MG tablet Take 1 tablet (500 mg total) by mouth 2 (two) times daily.   amLODipine (NORVASC) 10 MG tablet Take 1 tablet (10 mg total) by mouth daily.   cephALEXin (KEFLEX) 500 MG capsule Take 1 capsule (500 mg total) by mouth 2 (two) times daily.  (Patient not taking: Reported on 01/26/2022)   cyclobenzaprine (FLEXERIL) 10 MG tablet Take 1 tablet (10 mg total) by mouth 2 (two) times daily as needed for muscle spasms.   DULoxetine (CYMBALTA) 30 MG capsule Take 30 mg by mouth 2 (two) times daily. (Patient not taking: Reported on 01/26/2022)   gabapentin (NEURONTIN) 300 MG capsule Take 1 capsule (300 mg total) by mouth 4 (four) times daily. (Patient not taking: Reported on 01/26/2022)   HYDROcodone-acetaminophen (NORCO) 5-325 MG tablet Take 1 tablet by mouth every 6 (six) hours as needed for moderate pain. (Patient not taking: Reported on 01/26/2022)   metroNIDAZOLE (FLAGYL) 500 MG tablet Take 1 tablet (500 mg total) by mouth 2 (two) times daily.   NARCAN 4 MG/0.1ML LIQD nasal spray kit SMARTSIG:1 Spray(s) Both Nares PRN (Patient not taking: Reported on 01/26/2022)   ondansetron (ZOFRAN) 4 MG tablet Take 1 tablet (4 mg total) by mouth every 8 (eight) hours as needed for nausea or vomiting. (Patient not taking: Reported on 01/26/2022)   ondansetron (ZOFRAN-ODT) 8 MG disintegrating tablet Take 8 mg by mouth 2 (two) times daily as needed. (Patient not taking: Reported on 01/26/2022)   pravastatin (PRAVACHOL) 80 MG tablet Take 1 tablet (80 mg total) by mouth daily.   rivaroxaban (XARELTO) 10 MG TABS tablet Take 1 tablet (10 mg total) by mouth daily.   Vitamin D, Ergocalciferol, (  DRISDOL) 1.25 MG (50000 UNIT) CAPS capsule Take 1 capsule (50,000 Units total) by mouth every 7 (seven) days.   No facility-administered encounter medications on file as of 02/09/2022.    Review of Systems:  Review of Systems  Constitutional:  Negative for appetite change, chills, fatigue and fever.  HENT:  Negative for congestion, hearing loss, rhinorrhea and sore throat.   Eyes: Negative.   Respiratory:  Negative for cough, shortness of breath and wheezing.   Cardiovascular:  Negative for chest pain, palpitations and leg swelling.  Gastrointestinal:  Negative for  abdominal pain, constipation, diarrhea, nausea and vomiting.  Genitourinary:  Negative for dysuria.  Musculoskeletal:  Negative for arthralgias, back pain and myalgias.  Skin:  Negative for color change, rash and wound.  Neurological:  Negative for dizziness, weakness and headaches.  Psychiatric/Behavioral:  Negative for behavioral problems. The patient is not nervous/anxious.     Health Maintenance  Topic Date Due   Medicare Annual Wellness (AWV)  Never done   COVID-19 Vaccine (1) Never done   DTaP/Tdap/Td (1 - Tdap) Never done   COLONOSCOPY (Pts 45-39yr Insurance coverage will need to be confirmed)  Never done   Zoster Vaccines- Shingrix (1 of 2) Never done   MAMMOGRAM  12/19/2017   PAP SMEAR-Modifier  12/03/2019   INFLUENZA VACCINE  Never done   Hepatitis C Screening  Completed   HIV Screening  Completed   HPV VACCINES  Aged Out    Physical Exam: Vitals:   02/09/22 1509  BP: 128/72  Pulse: 77  Resp: 18  Temp: 97.7 F (36.5 C)  SpO2: 95%  Weight: 175 lb (79.4 kg)  Height: _0  (1.651 m)   Body mass index is 29.12 kg/m. Physical Exam Constitutional:      Appearance: Normal appearance.  HENT:     Head: Normocephalic and atraumatic.     Nose: Nose normal.     Mouth/Throat:     Mouth: Mucous membranes are moist.  Eyes:     Conjunctiva/sclera: Conjunctivae normal.  Cardiovascular:     Rate and Rhythm: Normal rate and regular rhythm.  Pulmonary:     Effort: Pulmonary effort is normal.     Breath sounds: Normal breath sounds.  Abdominal:     General: Bowel sounds are normal.     Palpations: Abdomen is soft.  Musculoskeletal:        General: Normal range of motion.     Cervical back: Normal range of motion.     Comments: Right knee with non-tender nodule.  Skin:    General: Skin is warm and dry.  Neurological:     General: No focal deficit present.     Mental Status: She is alert and oriented to person, place, and time.  Psychiatric:        Mood and  Affect: Mood normal.        Behavior: Behavior normal.        Thought Content: Thought content normal.        Judgment: Judgment normal.     Labs reviewed: Basic Metabolic Panel: Recent Labs    02/03/22 1348  NA 144  K 3.9  CL 110  CO2 28  GLUCOSE 95  BUN 20  CREATININE 0.95  CALCIUM 9.6   Liver Function Tests: Recent Labs    02/03/22 1348  AST 11  ALT 11  BILITOT 0.3  PROT 6.7   No results for input(s): "LIPASE", "AMYLASE" in the last 8760 hours. No results for input(s): "  AMMONIA" in the last 8760 hours. CBC: Recent Labs    02/03/22 1348  WBC 6.0  NEUTROABS 4,212  HGB 12.9  HCT 40.1  MCV 89.9  PLT 309   Lipid Panel: Recent Labs    02/03/22 1348  CHOL 237*  HDL 45*  LDLCALC 170*  TRIG 103  CHOLHDL 5.3*   Lab Results  Component Value Date   HGBA1C 5.7 (H) 02/03/2022    Procedures since last visit: No results found.  Assessment/Plan  1. Trichomonas vaginalis infection -  has only 1 partner, denies pruritus dyspareunia nor vaginal discharge - metroNIDAZOLE (FLAGYL) 500 MG tablet; Take 1 tablet (500 mg total) by mouth 2 (two) times daily.  Dispense: 14 tablet; Refill: 0  2. Primary hypertension -  stable, continue Amlodipine  3. Chronic pain of both knees -  stable, continue Norco PRN, Cymbalta, Gabapentin and Flexeril PRN - Uric acid   Labs/tests ordered:  uric acid  Next appt:  as needed

## 2022-02-10 LAB — URIC ACID: Uric Acid, Serum: 3.6 mg/dL (ref 2.5–7.0)

## 2022-02-10 NOTE — Progress Notes (Signed)
Uric acid is not elevated, no gout

## 2022-02-12 ENCOUNTER — Other Ambulatory Visit: Payer: Self-pay

## 2022-02-13 ENCOUNTER — Other Ambulatory Visit: Payer: Self-pay

## 2022-02-24 ENCOUNTER — Encounter: Payer: Self-pay | Admitting: Physical Medicine & Rehabilitation

## 2022-03-06 LAB — COLOGUARD: COLOGUARD: NEGATIVE

## 2022-03-23 LAB — HM MAMMOGRAPHY

## 2022-03-30 ENCOUNTER — Encounter
Payer: No Typology Code available for payment source | Attending: Physical Medicine & Rehabilitation | Admitting: Physical Medicine & Rehabilitation

## 2022-03-30 ENCOUNTER — Encounter: Payer: Self-pay | Admitting: Physical Medicine & Rehabilitation

## 2022-03-30 ENCOUNTER — Other Ambulatory Visit: Payer: Self-pay

## 2022-03-30 VITALS — BP 153/82 | HR 61 | Ht 65.0 in | Wt 172.0 lb

## 2022-03-30 DIAGNOSIS — F32A Depression, unspecified: Secondary | ICD-10-CM | POA: Diagnosis not present

## 2022-03-30 DIAGNOSIS — M5416 Radiculopathy, lumbar region: Secondary | ICD-10-CM | POA: Diagnosis not present

## 2022-03-30 DIAGNOSIS — M542 Cervicalgia: Secondary | ICD-10-CM | POA: Diagnosis not present

## 2022-03-30 DIAGNOSIS — F1910 Other psychoactive substance abuse, uncomplicated: Secondary | ICD-10-CM

## 2022-03-30 MED ORDER — TIZANIDINE HCL 2 MG PO TABS
2.0000 mg | ORAL_TABLET | Freq: Three times a day (TID) | ORAL | 3 refills | Status: DC | PRN
  Filled 2022-03-30: qty 90, 30d supply, fill #0
  Filled 2022-05-20: qty 90, 30d supply, fill #1
  Filled 2022-08-27 – 2022-10-09 (×2): qty 90, 30d supply, fill #2

## 2022-03-30 MED ORDER — DULOXETINE HCL 30 MG PO CPEP
60.0000 mg | ORAL_CAPSULE | Freq: Every day | ORAL | 2 refills | Status: DC
  Filled 2022-03-30: qty 60, 30d supply, fill #0
  Filled 2022-05-20: qty 60, 30d supply, fill #1
  Filled 2022-08-27 – 2022-10-09 (×2): qty 60, 30d supply, fill #2

## 2022-03-30 NOTE — Progress Notes (Signed)
Subjective:    Patient ID: [name], female    DOB: 1960-06-01, 62 y.o.   MRN: [id number]  HPI [name] is a 62 y.o. year old female  who  has a past medical history of DVT (deep venous thrombosis) ([place]), Headache(784.0), History of hysterectomy (2014), Hyperlipidemia, Hypertension (09/2018), Plantar warts (08/2019), SVD (spontaneous vaginal delivery), and Vitamin D deficiency (09/2018).   They are presenting to PM&R clinic as a new patient for pain management evaluation. They were referred  for treatment of chronic pain.  Pain is mostly in her lower back and shoots down to her feet in both legs. Nothing makes it better or worse.  She has had pain for more than 10 years. Pain is worsened with activities. She has a history of polysubstance abuse with Crack and Marijuana. She later was followed by [facility] for pain but reports she had an issue with pill counts, reports she didn't get the message and was later discharged. She has since restarted polysubstance use. She reports she has been diagnosed with CTS at urgent care.  She reports she has tingling occasionally in the tips of all her fingers.  She is not having this sensation currently.  She reports she was not able to do a lot of treatments due to prior lack of insurance, she has insurance now.  She previously used duloxetine for mood, has not used it in several months.   Red flag symptoms: Patient denies saddle anesthesia, loss of bowel or bladder continence, new weakness, new numbness/tingling, or pain waking up at nighttime.  Medications tried: Tylenol-doesn't help Ibuprofen- doesn't help Percocet-pain clinic [facility]-long story, issue with pill counts, discharged Gabapentin- didn't help Hasn't tried lyrica Prior use of duloxetine , she is not sure why she stopped Suboxone made her feel bad Tramadol didn't help   Other treatments: PT/OT  -unable to get due to no insurance in past TENS unit- has not  tried Using drugs- smoking crack, prior addiction, marijuanna Injections in her knees help for knee OA  Lower back pain for 10+ years CTS- Urgent care  Lower back pain Tinels and phalens negative  Pain Inventory Average Pain 10 Pain Right Now 9 My pain is intermittent and aching  In the last 24 hours, has pain interfered with the following? General activity 9 Relation with others 8 Enjoyment of life 5 What TIME of day is your pain at its worst? night Sleep (in general) Poor  Pain is worse with: standing and some activites Pain improves with: pacing activities and medication Relief from Meds: 8  walk without assistance use a cane how many minutes can you walk? 5-10 ability to climb steps?  yes do you drive?  yes transfers alone  not employed: date last employed . disabled: date disabled .  trouble walking spasms  New pt  New pt    Family History  Problem Relation Age of Onset   Heart failure Mother    Hypertension Mother    Breast cancer Sister    Diabetes Sister    Social History   Socioeconomic History   Marital status: Married    Spouse name: Not on file   Number of children: Not on file   Years of education: Not on file   Highest education level: Not on file  Occupational History   Not on file  Tobacco Use   Smoking status: Every Day    Packs/day: 0.25    Years: 38.00    Total  pack years: 9.50    Types: Cigarettes   Smokeless tobacco: Never   Tobacco comments:    3 per day.   Vaping Use   Vaping Use: Never used  Substance and Sexual Activity   Alcohol use: No    Comment:     Drug use: No   Sexual activity: Yes    Birth control/protection: Surgical  Other Topics Concern   Not on file  Social History Narrative   Not on file   Social Determinants of Health   Financial Resource Strain: Not on file  Food Insecurity: Not on file  Transportation Needs: Not on file  Physical Activity: Not on file  Stress: Not on file  Social  Connections: Not on file   Past Surgical History:  Procedure Laterality Date   ABDOMINAL HYSTERECTOMY  04/06/2012   Procedure: HYSTERECTOMY ABDOMINAL;  Surgeon: Margarette Asal, MD;  Location: [facility];  Service: Gynecology;  Laterality: N/A;   BREAST SURGERY     benign turmor removed   SALPINGOOPHORECTOMY  04/06/2012   Procedure: SALPINGO OOPHORECTOMY;  Surgeon: Margarette Asal, MD;  Location: [facility];  Service: Gynecology;  Laterality: Bilateral;   TUBAL LIGATION     Past Medical History:  Diagnosis Date   DVT (deep venous thrombosis) (HCC)    Headache(784.0)    otc meds prn    History of hysterectomy 2014   Hyperlipidemia    Hypertension 09/2018   Plantar warts 08/2019   SVD (spontaneous vaginal delivery)    x 5   Vitamin D deficiency 09/2018   BP (!) 153/82   Pulse 61   Ht '5\' 5"'$  (1.651 m)   Wt 172 lb (78 kg)   LMP 06/13/2011   SpO2 98%   BMI 28.62 kg/m   Opioid Risk Score:  high  Fall Risk Score:  `1  Depression screen [facility] 2/9     10/25/2018    2:56 PM 09/14/2018    8:08 AM 02/08/2017    9:11 AM 01/18/2017    8:09 AM 01/11/2017    8:43 AM 12/02/2016    9:14 AM 10/12/2016   10:15 AM  Depression screen PHQ 2/9  Decreased Interest 0 0 0 0 0 0 0  Down, Depressed, Hopeless 0 0 0 0 0 0 0  PHQ - 2 Score 0 0 0 0 0 0 0    Review of Systems  Musculoskeletal:  Positive for back pain.       Pain going down back of legs spasms  Neurological:        Tingling  All other systems reviewed and are negative.      Objective:   Physical Exam  Gen: no distress, normal appearing HEENT: oral mucosa pink and moist, NCAT Cardio: Reg rate Chest: normal effort, normal rate of breathing Abd: soft, non-distended Ext: no edema Psych: pleasant, normal affect Skin: intact Neuro: cn2-12 grossly intact, awake and alert, follows commands Strength 5/5 in b/l UE and LE Sensation intact to b/l UE and LE No ankle clonus FTN intact b/l Musculoskeletal:  SLR neg b/l  Spurlings  negative FABER and FADIR neg with lower back pain noted + lumbar paraspinal tenderness, mild C spine tenderness Facet loading negative Knee pain with valgus and varus stress b/l   L spine xray 12/25/15 FINDINGS: Lumbar vertebra numbered with the lowest lumbar shaped segmented appearing vertebra as L5. No acute bony abnormality identified. Diffuse mild degenerative change. 4 mm anterolisthesis L4 on L5 .   IMPRESSION: Diffuse  multilevel mild degenerative change. 4 mm anterolisthesis L4 on L5.    C spine xray 01/22/19 FINDINGS: Stable an normal prevertebral soft tissue contour. Chronic straightening of cervical lordosis and mild degenerative appearing anterolisthesis of C3 on C4, stable. Bilateral posterior element alignment is within normal limits. Cervicothoracic junction alignment is within normal limits. Normal cervical AP alignment. Normal C1-C2 alignment. Negative odontoid.   Stable disc spaces, including mild disc space loss at C5-C6. Mild endplate spurring at M6-N8 and C5-C6 has increased since 2018. Negative visible upper chest   IMPRESSION: 1. No acute osseous abnormality identified in the cervical spine. 2. Mild chronic cervical spondylolisthesis, lower cervical degeneration appears stable since 2018.       Knee pain 01/22/2019 FINDINGS: No evidence of fracture, dislocation, or joint effusion. No evidence of arthropathy or other focal bone abnormality. Soft tissues are unremarkable.   IMPRESSION: Negative.     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 She has mild neck pain, Mild degenerative changes noted in L spine and C spine -High risk opioid risk tool -Duloxetine '30mg'$  for 2 week then '60mg'$  ordered -Tizanidine '2mg'$  q8h PRN ordered -Consider lyrica -Referral to PT  -Consider TENS unit   Polysubstance abuse -Advise discontinuation of these substances  Mood disorder, prior use of cymbalta -Restart Cymbalta '30mg'$  for 1  week

## 2022-03-31 NOTE — Therapy (Unsigned)
OUTPATIENT PHYSICAL THERAPY THORACOLUMBAR EVALUATION   Patient Name: [name] MRN: [id number] DOB:May 30, 1960, 62 y.o., female Today's Date: 04/02/2022  END OF SESSION:  PT End of Session - 04/02/22 1328     Visit Number 1    Number of Visits 12    Date for PT Re-Evaluation 05/28/22    Authorization Type [employer]    Progress Note Due on Visit 10    PT Start Time 1615    PT Stop Time 1700    PT Time Calculation (min) 45 min    Activity Tolerance Patient tolerated treatment well    Behavior During Therapy [facility] for tasks assessed/performed             Past Medical History:  Diagnosis Date   DVT (deep venous thrombosis) ([place])    Headache(784.0)    otc meds prn    History of hysterectomy 2014   Hyperlipidemia    Hypertension 09/2018   Plantar warts 08/2019   SVD (spontaneous vaginal delivery)    x 5   Vitamin D deficiency 09/2018   Past Surgical History:  Procedure Laterality Date   ABDOMINAL HYSTERECTOMY  04/06/2012   Procedure: HYSTERECTOMY ABDOMINAL;  Surgeon: Margarette Asal, MD;  Location: [facility];  Service: Gynecology;  Laterality: N/A;   BREAST SURGERY     benign turmor removed   SALPINGOOPHORECTOMY  04/06/2012   Procedure: SALPINGO OOPHORECTOMY;  Surgeon: Margarette Asal, MD;  Location: [facility];  Service: Gynecology;  Laterality: Bilateral;   TUBAL LIGATION     Patient Active Problem List   Diagnosis Date Noted   Plantar warts 09/10/2019   Hot flashes 10/29/2018   Essential hypertension 09/14/2018   Lower extremity pain, right 09/14/2018   Pain of right upper extremity 09/14/2018   History of DVT (deep vein thrombosis) 09/14/2018   Muscle spasms of both lower extremities 09/14/2018   Cervical disc disorder with radiculopathy of cervical region 01/15/2016   Low back pain 12/25/2015   VTE (venous thromboembolism) 04/15/2012   Leiomyoma 04/08/2012    PCP: Nickola Major, NP   REFERRING PROVIDER: Jennye Boroughs, MD   REFERRING  DIAG: M54.16 (ICD-10-CM) - Lumbar back pain with radiculopathy affecting right lower extremity   Rationale for Evaluation and Treatment: Rehabilitation  THERAPY DIAG: Low back pain, R piriformis   ONSET DATE: 10 years  SUBJECTIVE:                                                                                                                                                                                           SUBJECTIVE STATEMENT: [name] is a 62  y.o. year old female  who  has a past medical history of DVT (deep venous thrombosis) ([place]), Headache(784.0), History of hysterectomy (2014), Hyperlipidemia, Hypertension (09/2018), Plantar warts (08/2019), SVD (spontaneous vaginal delivery), and Vitamin D deficiency (09/2018).   They are presenting to PM&R clinic as a new patient for pain management evaluation. They were referred  for treatment of chronic pain.  Pain is mostly in her lower back and shoots down to her feet in both legs. Nothing makes it better or worse.  She has had pain for more than 10 years. Pain is worsened with activities. She has a history of polysubstance abuse with Crack and Marijuana. She later was followed by [facility] for pain but reports she had an issue with pill counts, reports she didn't get the message and was later discharged. She has since restarted polysubstance use. She reports she has been diagnosed with CTS at urgent care.  She reports she has tingling occasionally in the tips of all her fingers.  She is not having this sensation currently.  She reports she was not able to do a lot of treatments due to prior lack of insurance, she has insurance now.  She previously used duloxetine for mood, has not used it in several months.   PERTINENT HISTORY:  See chart  PAIN:  Are you having pain? Yes: NPRS scale: "11"/10 Pain location: R gluteal region Pain description: ache Aggravating factors: prolonged sitting/standing Relieving factors:  rest  PRECAUTIONS: None  WEIGHT BEARING RESTRICTIONS: No  FALLS:  Has patient fallen in last 6 months? No  OCCUPATION: not working  PLOF: Independent  PATIENT GOALS: To reduce and manage my pain and symptoms  NEXT MD VISIT: 1 month  OBJECTIVE:   DIAGNOSTIC FINDINGS:  None available  PATIENT SURVEYS:  FOTO 46(62 predicted)  SCREENING FOR RED FLAGS: Bowel or bladder incontinence: No  COGNITION: Overall cognitive status: Within functional limits for tasks assessed     SENSATION: Not tested  MUSCLE LENGTH: Hamstrings: Right 70 deg; Left 70 deg Thomas test: Right positive  POSTURE: No Significant postural limitations  PALPATION: TTP R piriformis  LUMBAR ROM:   AROM eval  Flexion 50%  Extension 25%  Right lateral flexion 50%  Left lateral flexion 50%  Right rotation 25%  Left rotation 25%   (Blank rows = not tested)  LOWER EXTREMITY ROM:   WFL throughout  Active  Right eval Left eval  Hip flexion    Hip extension    Hip abduction    Hip adduction    Hip internal rotation    Hip external rotation    Knee flexion    Knee extension    Ankle dorsiflexion    Ankle plantarflexion    Ankle inversion    Ankle eversion     (Blank rows = not tested)  LOWER EXTREMITY MMT:    MMT Right eval Left eval  Hip flexion 4 4  Hip extension 4 4  Hip abduction 4 4  Hip adduction    Hip internal rotation    Hip external rotation    Knee flexion 4 4  Knee extension 4 4  Ankle dorsiflexion    Ankle plantarflexion 4 4  Core  3+ 3+  Ankle inversion    Ankle eversion     (Blank rows = not tested)  LUMBAR SPECIAL TESTS:  Straight leg raise test: Negative, Slump test: Positive, and FABER test: Negative  FUNCTIONAL TESTS:  5 times sit to stand: 31s  GAIT:  Distance walked: [id number] Assistive device utilized: None Level of assistance: Complete Independence Comments: unremarkable  TODAY'S TREATMENT:                                                                                                                               DATE: 04/01/22 Eval and HEP   PATIENT EDUCATION:  Education details: Discussed eval findings, rehab rationale and POC and patient is in agreement  Person educated: Patient Education method: Explanation Education comprehension: verbalized understanding and needs further education  HOME EXERCISE PROGRAM: Access Code: [email or web address]: [email or web address] Date: 04/01/2022 Prepared by: JSharlynn Oliphant Exercises - Supine Piriformis Stretch with Foot on Ground  - 2 x daily - 5 x weekly - 1 sets - 2 reps - 30s hold - Hip Flexor Stretch at Edge of Bed  - 2 x daily - 5 x weekly - 1 sets - 2 reps - 30s hold - Supine Bridge  - 2 x daily - 5 x weekly - 1 sets - 10 reps  ASSESSMENT:  CLINICAL IMPRESSION: Patient is a 62y.o. female who was seen today for physical therapy evaluation and treatment for chronic low back pain and piriformis syndrome on R. Patient presents with LE and core weakness, decrease ROM and mobility in lumbar spine, R piriformis tightness and irritability.  Mild discomfort elicited with slump test R suggesting decreased sciatic nerve mobility  OBJECTIVE IMPAIRMENTS: decreased activity tolerance, decreased endurance, decreased knowledge of condition, decreased mobility, decreased ROM, decreased strength, increased fascial restrictions, impaired perceived functional ability, improper body mechanics, and pain.   ACTIVITY LIMITATIONS: carrying, lifting, bending, sitting, standing, sleeping, stairs, and bed mobility  PERSONAL FACTORS: Age, Past/current experiences, and Time since onset of injury/illness/exacerbation are also affecting patient's functional outcome.   REHAB POTENTIAL: Fair based on chronicity and limited benefit from conservative treatment to date  CLINICAL DECISION MAKING: Stable/uncomplicated  EVALUATION COMPLEXITY: Low   GOALS: Goals reviewed with patient? No  SHORT TERM  GOALS: Target date: 04/23/2022    Patient to demonstrate independence in HEP Baseline: [id number] Goal status: INITIAL  2.  Decrease 5x STS to 20s Baseline: 31s Goal status: INITIAL    LONG TERM GOALS: Target date: 05/14/2022    Increase AROM to 75% throughout Baseline:  AROM eval  Flexion 50%  Extension 25%  Right lateral flexion 50%  Left lateral flexion 50%  Right rotation 25%  Left rotation 25%   Goal status: INITIAL  2.  Increase FOTO score to 62 Baseline: 46 Goal status: INITIAL  3.  Increase core strength to 4/5 Baseline: 3+/5 Goal status: INITIAL  4.  Unremarkable R slump test Baseline: Positive R slump test Goal status: INITIAL  5.  Decrease pain to 6/10 at best Baseline: "11"/10  Goal status: INITIAL    PLAN:  PT FREQUENCY: 2x/week  PT DURATION: 6 weeks  PLANNED INTERVENTIONS: Therapeutic exercises, Therapeutic activity, Neuromuscular re-education, Balance training, Gait training, Patient/Family education, Self Care, Joint mobilization,  Stair training, Dry Needling, Spinal mobilization, Manual therapy, and Re-evaluation.  PLAN FOR NEXT SESSION: HEP review and update, trunk and core strengthening, sciatic nerve mobilization, stretch and flexibility tasks   Lanice Shirts, PT 04/02/2022, 1:31 PM

## 2022-04-01 ENCOUNTER — Ambulatory Visit: Payer: No Typology Code available for payment source | Attending: Physical Medicine & Rehabilitation

## 2022-04-01 ENCOUNTER — Other Ambulatory Visit: Payer: Self-pay

## 2022-04-01 DIAGNOSIS — M6281 Muscle weakness (generalized): Secondary | ICD-10-CM | POA: Insufficient documentation

## 2022-04-01 DIAGNOSIS — M5416 Radiculopathy, lumbar region: Secondary | ICD-10-CM | POA: Diagnosis not present

## 2022-04-01 DIAGNOSIS — G5701 Lesion of sciatic nerve, right lower limb: Secondary | ICD-10-CM | POA: Diagnosis not present

## 2022-04-01 DIAGNOSIS — M5459 Other low back pain: Secondary | ICD-10-CM | POA: Insufficient documentation

## 2022-04-06 ENCOUNTER — Ambulatory Visit: Payer: No Typology Code available for payment source

## 2022-04-06 DIAGNOSIS — M5459 Other low back pain: Secondary | ICD-10-CM | POA: Diagnosis not present

## 2022-04-06 DIAGNOSIS — M6281 Muscle weakness (generalized): Secondary | ICD-10-CM

## 2022-04-06 DIAGNOSIS — G5701 Lesion of sciatic nerve, right lower limb: Secondary | ICD-10-CM

## 2022-04-06 NOTE — Therapy (Addendum)
OUTPATIENT PHYSICAL THERAPY TREATMENT NOTE/DISCHARGE  PHYSICAL THERAPY DISCHARGE SUMMARY  Visits from Start of Care: 2  Current functional level related to goals / functional outcomes: See goals/objective   Remaining deficits: Unable to assess   Education / Equipment: HEP   Patient agrees to discharge. Patient goals were  unable to assess . Patient is being discharged due to not returning since the last visit.   Patient Name: Krista CholBarbara B Thomaston MRN: [id number] DOB:[birth year], 62 y.o., female Today's Date: 04/06/2022  PCP: Gillis SantaMedina-Vargas, Monina C, NP  REFERRING PROVIDER: Fanny DanceShtridelman, Yuri, MD    END OF SESSION:   PT End of Session - 04/06/22 1617     Visit Number 2    Number of Visits 12    Date for PT Re-Evaluation 05/28/22    Authorization Type [employer]    Progress Note Due on Visit 10    PT Start Time 1618    PT Stop Time 1657    PT Time Calculation (min) 39 min    Activity Tolerance Patient tolerated treatment well    Behavior During Therapy WFL for tasks assessed/performed             Past Medical History:  Diagnosis Date   DVT (deep venous thrombosis) ([facility])    Headache(784.0)    otc meds prn    History of hysterectomy 2014   Hyperlipidemia    Hypertension 09/2018   Plantar warts 08/2019   SVD (spontaneous vaginal delivery)    x 5   Vitamin D deficiency 09/2018   Past Surgical History:  Procedure Laterality Date   ABDOMINAL HYSTERECTOMY  04/06/2012   Procedure: HYSTERECTOMY ABDOMINAL;  Surgeon: Meriel Picaichard M Holland, MD;  Location: [facility];  Service: Gynecology;  Laterality: N/A;   BREAST SURGERY     benign turmor removed   SALPINGOOPHORECTOMY  04/06/2012   Procedure: SALPINGO OOPHORECTOMY;  Surgeon: Meriel Picaichard M Holland, MD;  Location: [facility];  Service: Gynecology;  Laterality: Bilateral;   TUBAL LIGATION     Patient Active Problem List   Diagnosis Date Noted   Plantar warts 09/10/2019   Hot flashes 10/29/2018   Essential hypertension 09/14/2018    Lower extremity pain, right 09/14/2018   Pain of right upper extremity 09/14/2018   History of DVT (deep vein thrombosis) 09/14/2018   Muscle spasms of both lower extremities 09/14/2018   Cervical disc disorder with radiculopathy of cervical region 01/15/2016   Low back pain 12/25/2015   VTE (venous thromboembolism) 04/15/2012   Leiomyoma 04/08/2012    REFERRING DIAG: M54.16 (ICD-10-CM) - Lumbar back pain with radiculopathy affecting right lower extremity   THERAPY DIAG:  Other low back pain  Piriformis syndrome of right side  Muscle weakness (generalized)  Rationale for Evaluation and Treatment Rehabilitation  PERTINENT HISTORY: See chart   PRECAUTIONS: None  SUBJECTIVE:  SUBJECTIVE STATEMENT:  Pt presents to PT with reports of continued LBP. Has been compliant with HEP with no adverse effects reported. Ready to begin PT at this time.    PAIN:  Are you having pain? Yes: NPRS scale: "11"/10 Pain location: R gluteal region Pain description: ache Aggravating factors: prolonged sitting/standing Relieving factors: rest   OBJECTIVE: (objective measures completed at initial evaluation unless otherwise dated)  DIAGNOSTIC FINDINGS:  None available   PATIENT SURVEYS:  FOTO 46(62 predicted)   SCREENING FOR RED FLAGS: Bowel or bladder incontinence: No   COGNITION: Overall cognitive status: Within functional limits for tasks assessed                          SENSATION: Not tested   MUSCLE LENGTH: Hamstrings: Right 70 deg; Left 70 deg Thomas test: Right positive   POSTURE: No Significant postural limitations   PALPATION: TTP R piriformis   LUMBAR ROM:    AROM eval  Flexion 50%  Extension 25%  Right lateral flexion 50%  Left lateral flexion 50%  Right rotation 25%  Left rotation  25%   (Blank rows = not tested)   LOWER EXTREMITY ROM:   WFL throughout   Active  Right eval Left eval  Hip flexion      Hip extension      Hip abduction      Hip adduction      Hip internal rotation      Hip external rotation      Knee flexion      Knee extension      Ankle dorsiflexion      Ankle plantarflexion      Ankle inversion      Ankle eversion       (Blank rows = not tested)   LOWER EXTREMITY MMT:     MMT Right eval Left eval  Hip flexion 4 4  Hip extension 4 4  Hip abduction 4 4  Hip adduction      Hip internal rotation      Hip external rotation      Knee flexion 4 4  Knee extension 4 4  Ankle dorsiflexion      Ankle plantarflexion 4 4  Core  3+ 3+  Ankle inversion      Ankle eversion       (Blank rows = not tested)   LUMBAR SPECIAL TESTS:  Straight leg raise test: Negative, Slump test: Positive, and FABER test: Negative   FUNCTIONAL TESTS:  5 times sit to stand: 31s   GAIT: Distance walked: 2175ftx2 Assistive device utilized: None Level of assistance: Complete Independence Comments: unremarkable   TODAY'S TREATMENT: [facility] Adult PT Treatment:                                                DATE: 04/06/2022 Therapeutic Exercise: NuStep lvl 5 UE/LE x 3 min while taking subjective LTR x 10 each Modified thomas stretch x 60" each Supine PPT with ball 2x10 - 5" hold  Supine clamshell 2x10 GTB Supine march 2x20 GTB Seated sciatic nerve glide x 10 each  STS 2x10 - high table   PATIENT EDUCATION:  Education details: Discussed eval findings, rehab rationale and POC and patient is in agreement  Person educated: Patient Education method: Explanation Education comprehension: verbalized understanding and needs  further education   HOME EXERCISE PROGRAM: Access Code: [email or web address] URL: [email or web address] Date: 04/01/2022 Prepared by: Gustavus Bryant   Exercises - Supine Piriformis Stretch with Foot on Ground  - 2 x daily - 5 x weekly -  1 sets - 2 reps - 30s hold - Hip Flexor Stretch at Edge of Bed  - 2 x daily - 5 x weekly - 1 sets - 2 reps - 30s hold - Supine Bridge  - 2 x daily - 5 x weekly - 1 sets - 10 reps   ASSESSMENT:   CLINICAL IMPRESSION: Pt was able to complete all prescribed exercises with no adverse effect. Therapy focused on improving core and proximal hip strength in order to decrease pain and improve function. She continues to benefit from PT, will progress as able per POC.    OBJECTIVE IMPAIRMENTS: decreased activity tolerance, decreased endurance, decreased knowledge of condition, decreased mobility, decreased ROM, decreased strength, increased fascial restrictions, impaired perceived functional ability, improper body mechanics, and pain.    ACTIVITY LIMITATIONS: carrying, lifting, bending, sitting, standing, sleeping, stairs, and bed mobility   PERSONAL FACTORS: Age, Past/current experiences, and Time since onset of injury/illness/exacerbation are also affecting patient's functional outcome.        GOALS: Goals reviewed with patient? No   SHORT TERM GOALS: Target date: 04/23/2022     Patient to demonstrate independence in HEP Baseline: [id number] Goal status: INITIAL   2.  Decrease 5x STS to 20s Baseline: 31s Goal status: INITIAL       LONG TERM GOALS: Target date: 05/14/2022     Increase AROM to 75% throughout Baseline:  AROM eval  Flexion 50%  Extension 25%  Right lateral flexion 50%  Left lateral flexion 50%  Right rotation 25%  Left rotation 25%    Goal status: INITIAL   2.  Increase FOTO score to 62 Baseline: 46 Goal status: INITIAL   3.  Increase core strength to 4/5 Baseline: 3+/5 Goal status: INITIAL   4.  Unremarkable R slump test Baseline: Positive R slump test Goal status: INITIAL   5.  Decrease pain to 6/10 at best Baseline: "11"/10  Goal status: INITIAL       PLAN:   PT FREQUENCY: 2x/week   PT DURATION: 6 weeks   PLANNED INTERVENTIONS: Therapeutic  exercises, Therapeutic activity, Neuromuscular re-education, Balance training, Gait training, Patient/Family education, Self Care, Joint mobilization, Stair training, Dry Needling, Spinal mobilization, Manual therapy, and Re-evaluation.   PLAN FOR NEXT SESSION: HEP review and update, trunk and core strengthening, sciatic nerve mobilization, stretch and flexibility tasks   Eloy End, PT 04/06/2022, 5:00 PM

## 2022-04-08 ENCOUNTER — Telehealth: Payer: Self-pay

## 2022-04-08 ENCOUNTER — Ambulatory Visit: Payer: No Typology Code available for payment source

## 2022-04-08 NOTE — Therapy (Deleted)
OUTPATIENT PHYSICAL THERAPY TREATMENT NOTE   Patient Name: [name] MRN: [id number] DOB:May 28, 1960, 62 y.o., female 100 Date: 04/08/2022  PCP: Nickola Major, NP  REFERRING PROVIDER: Jennye Boroughs, MD    END OF SESSION:     Past Medical History:  Diagnosis Date   DVT (deep venous thrombosis) ([place])    Headache(784.0)    otc meds prn    History of hysterectomy 2014   Hyperlipidemia    Hypertension 09/2018   Plantar warts 08/2019   SVD (spontaneous vaginal delivery)    x 5   Vitamin D deficiency 09/2018   Past Surgical History:  Procedure Laterality Date   ABDOMINAL HYSTERECTOMY  04/06/2012   Procedure: HYSTERECTOMY ABDOMINAL;  Surgeon: Margarette Asal, MD;  Location: [facility];  Service: Gynecology;  Laterality: N/A;   BREAST SURGERY     benign turmor removed   SALPINGOOPHORECTOMY  04/06/2012   Procedure: SALPINGO OOPHORECTOMY;  Surgeon: Margarette Asal, MD;  Location: [facility];  Service: Gynecology;  Laterality: Bilateral;   TUBAL LIGATION     Patient Active Problem List   Diagnosis Date Noted   Plantar warts 09/10/2019   Hot flashes 10/29/2018   Essential hypertension 09/14/2018   Lower extremity pain, right 09/14/2018   Pain of right upper extremity 09/14/2018   History of DVT (deep vein thrombosis) 09/14/2018   Muscle spasms of both lower extremities 09/14/2018   Cervical disc disorder with radiculopathy of cervical region 01/15/2016   Low back pain 12/25/2015   VTE (venous thromboembolism) 04/15/2012   Leiomyoma 04/08/2012    REFERRING DIAG: M54.16 (ICD-10-CM) - Lumbar back pain with radiculopathy affecting right lower extremity   THERAPY DIAG: Lumbar back pain with radiculopathy affecting right lower extremity   Rationale for Evaluation and Treatment Rehabilitation  PERTINENT HISTORY: See chart   PRECAUTIONS: None  SUBJECTIVE:                                                                                                                                                                                       SUBJECTIVE STATEMENT:  Pt presents to PT with reports of continued LBP. Has been compliant with HEP with no adverse effects reported. Ready to begin PT at this time.    PAIN:  Are you having pain? Yes: NPRS scale: "11"/10 Pain location: R gluteal region Pain description: ache Aggravating factors: prolonged sitting/standing Relieving factors: rest   OBJECTIVE: (objective measures completed at initial evaluation unless otherwise dated)  DIAGNOSTIC FINDINGS:  None available   PATIENT SURVEYS:  FOTO 46(62 predicted)   SCREENING FOR RED FLAGS: Bowel or bladder incontinence: No   COGNITION: Overall cognitive status: Within  functional limits for tasks assessed                          SENSATION: Not tested   MUSCLE LENGTH: Hamstrings: Right 70 deg; Left 70 deg Thomas test: Right positive   POSTURE: No Significant postural limitations   PALPATION: TTP R piriformis   LUMBAR ROM:    AROM eval  Flexion 50%  Extension 25%  Right lateral flexion 50%  Left lateral flexion 50%  Right rotation 25%  Left rotation 25%   (Blank rows = not tested)   LOWER EXTREMITY ROM:   WFL throughout   Active  Right eval Left eval  Hip flexion      Hip extension      Hip abduction      Hip adduction      Hip internal rotation      Hip external rotation      Knee flexion      Knee extension      Ankle dorsiflexion      Ankle plantarflexion      Ankle inversion      Ankle eversion       (Blank rows = not tested)   LOWER EXTREMITY MMT:     MMT Right eval Left eval  Hip flexion 4 4  Hip extension 4 4  Hip abduction 4 4  Hip adduction      Hip internal rotation      Hip external rotation      Knee flexion 4 4  Knee extension 4 4  Ankle dorsiflexion      Ankle plantarflexion 4 4  Core  3+ 3+  Ankle inversion      Ankle eversion       (Blank rows = not tested)   LUMBAR SPECIAL TESTS:  Straight  leg raise test: Negative, Slump test: Positive, and FABER test: Negative   FUNCTIONAL TESTS:  5 times sit to stand: 31s   GAIT: Distance walked: 59fx2 Assistive device utilized: None Level of assistance: Complete Independence Comments: unremarkable   TODAY'S TREATMENT: [facility] Adult PT Treatment:                                                DATE: 04/06/2022 Therapeutic Exercise: NuStep lvl 5 UE/LE x 3 min while taking subjective LTR x 10 each Modified thomas stretch x 60" each Supine PPT with ball 2x10 - 5" hold  Supine clamshell 2x10 GTB Supine march 2x20 GTB Seated sciatic nerve glide x 10 each  STS 2x10 - high table   PATIENT EDUCATION:  Education details: Discussed eval findings, rehab rationale and POC and patient is in agreement  Person educated: Patient Education method: Explanation Education comprehension: verbalized understanding and needs further education   HOME EXERCISE PROGRAM: Access Code: [email or web address]: [email or web address] Date: 04/01/2022 Prepared by: JSharlynn Oliphant  Exercises - Supine Piriformis Stretch with Foot on Ground  - 2 x daily - 5 x weekly - 1 sets - 2 reps - 30s hold - Hip Flexor Stretch at Edge of Bed  - 2 x daily - 5 x weekly - 1 sets - 2 reps - 30s hold - Supine Bridge  - 2 x daily - 5 x weekly - 1 sets - 10 reps   ASSESSMENT:   CLINICAL IMPRESSION: Pt was able  to complete all prescribed exercises with no adverse effect. Therapy focused on improving core and proximal hip strength in order to decrease pain and improve function. She continues to benefit from PT, will progress as able per POC.    OBJECTIVE IMPAIRMENTS: decreased activity tolerance, decreased endurance, decreased knowledge of condition, decreased mobility, decreased ROM, decreased strength, increased fascial restrictions, impaired perceived functional ability, improper body mechanics, and pain.    ACTIVITY LIMITATIONS: carrying, lifting, bending, sitting,  standing, sleeping, stairs, and bed mobility   PERSONAL FACTORS: Age, Past/current experiences, and Time since onset of injury/illness/exacerbation are also affecting patient's functional outcome.        GOALS: Goals reviewed with patient? No   SHORT TERM GOALS: Target date: 04/23/2022     Patient to demonstrate independence in HEP Baseline: [id number] Goal status: INITIAL   2.  Decrease 5x STS to 20s Baseline: 31s Goal status: INITIAL       LONG TERM GOALS: Target date: 05/14/2022     Increase AROM to 75% throughout Baseline:  AROM eval  Flexion 50%  Extension 25%  Right lateral flexion 50%  Left lateral flexion 50%  Right rotation 25%  Left rotation 25%    Goal status: INITIAL   2.  Increase FOTO score to 62 Baseline: 46 Goal status: INITIAL   3.  Increase core strength to 4/5 Baseline: 3+/5 Goal status: INITIAL   4.  Unremarkable R slump test Baseline: Positive R slump test Goal status: INITIAL   5.  Decrease pain to 6/10 at best Baseline: "11"/10  Goal status: INITIAL       PLAN:   PT FREQUENCY: 2x/week   PT DURATION: 6 weeks   PLANNED INTERVENTIONS: Therapeutic exercises, Therapeutic activity, Neuromuscular re-education, Balance training, Gait training, Patient/Family education, Self Care, Joint mobilization, Stair training, Dry Needling, Spinal mobilization, Manual therapy, and Re-evaluation.   PLAN FOR NEXT SESSION: HEP review and update, trunk and core strengthening, sciatic nerve mobilization, stretch and flexibility tasks   Lanice Shirts, PT 04/08/2022, 3:10 PM

## 2022-04-08 NOTE — Telephone Encounter (Signed)
TC due to missed visit, spoke directly to patient, sh forgot today's appointment and apologizes.  Reminded of next scheduled visit, declined to reschedule today's session

## 2022-04-13 ENCOUNTER — Ambulatory Visit: Payer: No Typology Code available for payment source

## 2022-04-13 NOTE — Therapy (Incomplete)
OUTPATIENT PHYSICAL THERAPY TREATMENT NOTE   Patient Name: [name] MRN: [id number] DOB:03-Jun-1960, 62 y.o., female Today's Date: 04/13/2022  PCP: Nickola Major, NP  REFERRING PROVIDER: Jennye Boroughs, MD    END OF SESSION:     Past Medical History:  Diagnosis Date   DVT (deep venous thrombosis) ([place])    Headache(784.0)    otc meds prn    History of hysterectomy 2014   Hyperlipidemia    Hypertension 09/2018   Plantar warts 08/2019   SVD (spontaneous vaginal delivery)    x 5   Vitamin D deficiency 09/2018   Past Surgical History:  Procedure Laterality Date   ABDOMINAL HYSTERECTOMY  04/06/2012   Procedure: HYSTERECTOMY ABDOMINAL;  Surgeon: Margarette Asal, MD;  Location: [facility];  Service: Gynecology;  Laterality: N/A;   BREAST SURGERY     benign turmor removed   SALPINGOOPHORECTOMY  04/06/2012   Procedure: SALPINGO OOPHORECTOMY;  Surgeon: Margarette Asal, MD;  Location: [facility];  Service: Gynecology;  Laterality: Bilateral;   TUBAL LIGATION     Patient Active Problem List   Diagnosis Date Noted   Plantar warts 09/10/2019   Hot flashes 10/29/2018   Essential hypertension 09/14/2018   Lower extremity pain, right 09/14/2018   Pain of right upper extremity 09/14/2018   History of DVT (deep vein thrombosis) 09/14/2018   Muscle spasms of both lower extremities 09/14/2018   Cervical disc disorder with radiculopathy of cervical region 01/15/2016   Low back pain 12/25/2015   VTE (venous thromboembolism) 04/15/2012   Leiomyoma 04/08/2012    REFERRING DIAG: M54.16 (ICD-10-CM) - Lumbar back pain with radiculopathy affecting right lower extremity   THERAPY DIAG:  No diagnosis found.  Rationale for Evaluation and Treatment Rehabilitation  PERTINENT HISTORY: See chart   PRECAUTIONS: None  SUBJECTIVE:                                                                                                                                                                                       SUBJECTIVE STATEMENT:  ***   PAIN:  Are you having pain? Yes: NPRS scale: "11"/10 Pain location: R gluteal region Pain description: ache Aggravating factors: prolonged sitting/standing Relieving factors: rest   OBJECTIVE: (objective measures completed at initial evaluation unless otherwise dated)  DIAGNOSTIC FINDINGS:  None available   PATIENT SURVEYS:  FOTO 46(62 predicted)   SCREENING FOR RED FLAGS: Bowel or bladder incontinence: No   COGNITION: Overall cognitive status: Within functional limits for tasks assessed                          SENSATION: Not  tested   MUSCLE LENGTH: Hamstrings: Right 70 deg; Left 70 deg Thomas test: Right positive   POSTURE: No Significant postural limitations   PALPATION: TTP R piriformis   LUMBAR ROM:    AROM eval  Flexion 50%  Extension 25%  Right lateral flexion 50%  Left lateral flexion 50%  Right rotation 25%  Left rotation 25%   (Blank rows = not tested)   LOWER EXTREMITY ROM:   WFL throughout   Active  Right eval Left eval  Hip flexion      Hip extension      Hip abduction      Hip adduction      Hip internal rotation      Hip external rotation      Knee flexion      Knee extension      Ankle dorsiflexion      Ankle plantarflexion      Ankle inversion      Ankle eversion       (Blank rows = not tested)   LOWER EXTREMITY MMT:     MMT Right eval Left eval  Hip flexion 4 4  Hip extension 4 4  Hip abduction 4 4  Hip adduction      Hip internal rotation      Hip external rotation      Knee flexion 4 4  Knee extension 4 4  Ankle dorsiflexion      Ankle plantarflexion 4 4  Core  3+ 3+  Ankle inversion      Ankle eversion       (Blank rows = not tested)   LUMBAR SPECIAL TESTS:  Straight leg raise test: Negative, Slump test: Positive, and FABER test: Negative   FUNCTIONAL TESTS:  5 times sit to stand: 31s   GAIT: Distance walked: 9fx2 Assistive device utilized:  None Level of assistance: Complete Independence Comments: unremarkable   TODAY'S TREATMENT: [facility] Adult PT Treatment:                                                DATE: 04/13/2022 Therapeutic Exercise: NuStep lvl 5 UE/LE x 3 min while taking subjective LTR x 10 each Modified thomas stretch x 60" each Supine PPT with ball 2x10 - 5" hold  Supine clamshell 2x10 GTB Supine march 2x20 GTB Seated sciatic nerve glide x 10 each  STS 2x10 - high table  [facility] Adult PT Treatment:                                                DATE: 04/06/2022 Therapeutic Exercise: NuStep lvl 5 UE/LE x 3 min while taking subjective LTR x 10 each Modified thomas stretch x 60" each Supine PPT with ball 2x10 - 5" hold  Supine clamshell 2x10 GTB Supine march 2x20 GTB Seated sciatic nerve glide x 10 each  STS 2x10 - high table   PATIENT EDUCATION:  Education details: Discussed eval findings, rehab rationale and POC and patient is in agreement  Person educated: Patient Education method: Explanation Education comprehension: verbalized understanding and needs further education   HOME EXERCISE PROGRAM: Access Code: [email or web address]: [email or web address] Date: 04/01/2022 Prepared by: JSharlynn Oliphant  Exercises - Supine Piriformis Stretch with Foot  on Ground  - 2 x daily - 5 x weekly - 1 sets - 2 reps - 30s hold - Hip Flexor Stretch at Edge of Bed  - 2 x daily - 5 x weekly - 1 sets - 2 reps - 30s hold - Supine Bridge  - 2 x daily - 5 x weekly - 1 sets - 10 reps   ASSESSMENT:   CLINICAL IMPRESSION: ***   OBJECTIVE IMPAIRMENTS: decreased activity tolerance, decreased endurance, decreased knowledge of condition, decreased mobility, decreased ROM, decreased strength, increased fascial restrictions, impaired perceived functional ability, improper body mechanics, and pain.    ACTIVITY LIMITATIONS: carrying, lifting, bending, sitting, standing, sleeping, stairs, and bed mobility   PERSONAL FACTORS:  Age, Past/current experiences, and Time since onset of injury/illness/exacerbation are also affecting patient's functional outcome.        GOALS: Goals reviewed with patient? No   SHORT TERM GOALS: Target date: 04/23/2022     Patient to demonstrate independence in HEP Baseline: [id number] Goal status: INITIAL   2.  Decrease 5x STS to 20s Baseline: 31s Goal status: INITIAL       LONG TERM GOALS: Target date: 05/14/2022     Increase AROM to 75% throughout Baseline:  AROM eval  Flexion 50%  Extension 25%  Right lateral flexion 50%  Left lateral flexion 50%  Right rotation 25%  Left rotation 25%    Goal status: INITIAL   2.  Increase FOTO score to 62 Baseline: 46 Goal status: INITIAL   3.  Increase core strength to 4/5 Baseline: 3+/5 Goal status: INITIAL   4.  Unremarkable R slump test Baseline: Positive R slump test Goal status: INITIAL   5.  Decrease pain to 6/10 at best Baseline: "11"/10  Goal status: INITIAL       PLAN:   PT FREQUENCY: 2x/week   PT DURATION: 6 weeks   PLANNED INTERVENTIONS: Therapeutic exercises, Therapeutic activity, Neuromuscular re-education, Balance training, Gait training, Patient/Family education, Self Care, Joint mobilization, Stair training, Dry Needling, Spinal mobilization, Manual therapy, and Re-evaluation.   PLAN FOR NEXT SESSION: HEP review and update, trunk and core strengthening, sciatic nerve mobilization, stretch and flexibility tasks   Ward Chatters, PT 04/13/2022, 8:38 AM

## 2022-04-15 ENCOUNTER — Ambulatory Visit: Payer: No Typology Code available for payment source

## 2022-04-15 NOTE — Therapy (Deleted)
OUTPATIENT PHYSICAL THERAPY TREATMENT NOTE   Patient Name: [name] MRN: [id number] DOB:12-22-1960, 62 y.o., female 84 Date: 04/15/2022  PCP: Nickola Major, NP  REFERRING PROVIDER: Jennye Boroughs, MD    END OF SESSION:     Past Medical History:  Diagnosis Date   DVT (deep venous thrombosis) ([place])    Headache(784.0)    otc meds prn    History of hysterectomy 2014   Hyperlipidemia    Hypertension 09/2018   Plantar warts 08/2019   SVD (spontaneous vaginal delivery)    x 5   Vitamin D deficiency 09/2018   Past Surgical History:  Procedure Laterality Date   ABDOMINAL HYSTERECTOMY  04/06/2012   Procedure: HYSTERECTOMY ABDOMINAL;  Surgeon: Margarette Asal, MD;  Location: [facility];  Service: Gynecology;  Laterality: N/A;   BREAST SURGERY     benign turmor removed   SALPINGOOPHORECTOMY  04/06/2012   Procedure: SALPINGO OOPHORECTOMY;  Surgeon: Margarette Asal, MD;  Location: [facility];  Service: Gynecology;  Laterality: Bilateral;   TUBAL LIGATION     Patient Active Problem List   Diagnosis Date Noted   Plantar warts 09/10/2019   Hot flashes 10/29/2018   Essential hypertension 09/14/2018   Lower extremity pain, right 09/14/2018   Pain of right upper extremity 09/14/2018   History of DVT (deep vein thrombosis) 09/14/2018   Muscle spasms of both lower extremities 09/14/2018   Cervical disc disorder with radiculopathy of cervical region 01/15/2016   Low back pain 12/25/2015   VTE (venous thromboembolism) 04/15/2012   Leiomyoma 04/08/2012    REFERRING DIAG: M54.16 (ICD-10-CM) - Lumbar back pain with radiculopathy affecting right lower extremity   THERAPY DIAG:  No diagnosis found.  Rationale for Evaluation and Treatment Rehabilitation  PERTINENT HISTORY: See chart   PRECAUTIONS: None  SUBJECTIVE:                                                                                                                                                                                       SUBJECTIVE STATEMENT:  Pt presents to PT with reports of continued LBP. Has been compliant with HEP with no adverse effects reported. Ready to begin PT at this time.    PAIN:  Are you having pain? Yes: NPRS scale: "11"/10 Pain location: R gluteal region Pain description: ache Aggravating factors: prolonged sitting/standing Relieving factors: rest   OBJECTIVE: (objective measures completed at initial evaluation unless otherwise dated)  DIAGNOSTIC FINDINGS:  None available   PATIENT SURVEYS:  FOTO 46(62 predicted)   SCREENING FOR RED FLAGS: Bowel or bladder incontinence: No   COGNITION: Overall cognitive status: Within functional limits for tasks assessed  SENSATION: Not tested   MUSCLE LENGTH: Hamstrings: Right 70 deg; Left 70 deg Thomas test: Right positive   POSTURE: No Significant postural limitations   PALPATION: TTP R piriformis   LUMBAR ROM:    AROM eval  Flexion 50%  Extension 25%  Right lateral flexion 50%  Left lateral flexion 50%  Right rotation 25%  Left rotation 25%   (Blank rows = not tested)   LOWER EXTREMITY ROM:   WFL throughout   Active  Right eval Left eval  Hip flexion      Hip extension      Hip abduction      Hip adduction      Hip internal rotation      Hip external rotation      Knee flexion      Knee extension      Ankle dorsiflexion      Ankle plantarflexion      Ankle inversion      Ankle eversion       (Blank rows = not tested)   LOWER EXTREMITY MMT:     MMT Right eval Left eval  Hip flexion 4 4  Hip extension 4 4  Hip abduction 4 4  Hip adduction      Hip internal rotation      Hip external rotation      Knee flexion 4 4  Knee extension 4 4  Ankle dorsiflexion      Ankle plantarflexion 4 4  Core  3+ 3+  Ankle inversion      Ankle eversion       (Blank rows = not tested)   LUMBAR SPECIAL TESTS:  Straight leg raise test: Negative, Slump test: Positive,  and FABER test: Negative   FUNCTIONAL TESTS:  5 times sit to stand: 31s   GAIT: Distance walked: 71fx2 Assistive device utilized: None Level of assistance: Complete Independence Comments: unremarkable   TODAY'S TREATMENT: [facility] Adult PT Treatment:                                                DATE: 04/06/2022 Therapeutic Exercise: NuStep lvl 5 UE/LE x 3 min while taking subjective LTR x 10 each Modified thomas stretch x 60" each Supine PPT with ball 2x10 - 5" hold  Supine clamshell 2x10 GTB Supine march 2x20 GTB Seated sciatic nerve glide x 10 each  STS 2x10 - high table   PATIENT EDUCATION:  Education details: Discussed eval findings, rehab rationale and POC and patient is in agreement  Person educated: Patient Education method: Explanation Education comprehension: verbalized understanding and needs further education   HOME EXERCISE PROGRAM: Access Code: [email or web address]: [email or web address] Date: 04/01/2022 Prepared by: JSharlynn Oliphant  Exercises - Supine Piriformis Stretch with Foot on Ground  - 2 x daily - 5 x weekly - 1 sets - 2 reps - 30s hold - Hip Flexor Stretch at Edge of Bed  - 2 x daily - 5 x weekly - 1 sets - 2 reps - 30s hold - Supine Bridge  - 2 x daily - 5 x weekly - 1 sets - 10 reps   ASSESSMENT:   CLINICAL IMPRESSION: Pt was able to complete all prescribed exercises with no adverse effect. Therapy focused on improving core and proximal hip strength in order to decrease pain and improve function. She continues to benefit  from PT, will progress as able per POC.    OBJECTIVE IMPAIRMENTS: decreased activity tolerance, decreased endurance, decreased knowledge of condition, decreased mobility, decreased ROM, decreased strength, increased fascial restrictions, impaired perceived functional ability, improper body mechanics, and pain.    ACTIVITY LIMITATIONS: carrying, lifting, bending, sitting, standing, sleeping, stairs, and bed mobility    PERSONAL FACTORS: Age, Past/current experiences, and Time since onset of injury/illness/exacerbation are also affecting patient's functional outcome.        GOALS: Goals reviewed with patient? No   SHORT TERM GOALS: Target date: 04/23/2022     Patient to demonstrate independence in HEP Baseline: [id number] Goal status: INITIAL   2.  Decrease 5x STS to 20s Baseline: 31s Goal status: INITIAL       LONG TERM GOALS: Target date: 05/14/2022     Increase AROM to 75% throughout Baseline:  AROM eval  Flexion 50%  Extension 25%  Right lateral flexion 50%  Left lateral flexion 50%  Right rotation 25%  Left rotation 25%    Goal status: INITIAL   2.  Increase FOTO score to 62 Baseline: 46 Goal status: INITIAL   3.  Increase core strength to 4/5 Baseline: 3+/5 Goal status: INITIAL   4.  Unremarkable R slump test Baseline: Positive R slump test Goal status: INITIAL   5.  Decrease pain to 6/10 at best Baseline: "11"/10  Goal status: INITIAL       PLAN:   PT FREQUENCY: 2x/week   PT DURATION: 6 weeks   PLANNED INTERVENTIONS: Therapeutic exercises, Therapeutic activity, Neuromuscular re-education, Balance training, Gait training, Patient/Family education, Self Care, Joint mobilization, Stair training, Dry Needling, Spinal mobilization, Manual therapy, and Re-evaluation.   PLAN FOR NEXT SESSION: HEP review and update, trunk and core strengthening, sciatic nerve mobilization, stretch and flexibility tasks   Lanice Shirts, PT 04/15/2022, 4:16 PM

## 2022-04-17 ENCOUNTER — Other Ambulatory Visit: Payer: Self-pay

## 2022-04-17 DIAGNOSIS — F17210 Nicotine dependence, cigarettes, uncomplicated: Secondary | ICD-10-CM

## 2022-04-17 DIAGNOSIS — Z87891 Personal history of nicotine dependence: Secondary | ICD-10-CM

## 2022-04-20 ENCOUNTER — Ambulatory Visit: Payer: No Typology Code available for payment source

## 2022-04-22 ENCOUNTER — Ambulatory Visit: Payer: No Typology Code available for payment source

## 2022-04-24 ENCOUNTER — Encounter: Payer: Medicare HMO | Attending: Physical Medicine & Rehabilitation | Admitting: Physical Medicine & Rehabilitation

## 2022-04-24 DIAGNOSIS — M542 Cervicalgia: Secondary | ICD-10-CM | POA: Insufficient documentation

## 2022-04-24 DIAGNOSIS — F1910 Other psychoactive substance abuse, uncomplicated: Secondary | ICD-10-CM | POA: Insufficient documentation

## 2022-04-24 DIAGNOSIS — M5416 Radiculopathy, lumbar region: Secondary | ICD-10-CM | POA: Insufficient documentation

## 2022-04-24 DIAGNOSIS — F32A Depression, unspecified: Secondary | ICD-10-CM | POA: Insufficient documentation

## 2022-04-24 NOTE — Progress Notes (Deleted)
Subjective:    Patient ID: [name], female    DOB: 1960/11/06, 62 y.o.   MRN: [id number]  HPI Pain Inventory Average Pain {NUMBERS; 0-10:5044} Pain Right Now {NUMBERS; 0-10:5044} My pain is {PAIN DESCRIPTION:21022940}  In the last 24 hours, has pain interfered with the following? General activity {NUMBERS; 0-10:5044} Relation with others {NUMBERS; 0-10:5044} Enjoyment of life {NUMBERS; 0-10:5044} What TIME of day is your pain at its worst? {time of day:24191} Sleep (in general) {BHH GOOD/FAIR/POOR:22877}  Pain is worse with: {ACTIVITIES:21022942} Pain improves with: {PAIN IMPROVES BW:[id number] Relief from Meds: {NUMBERS; 0-10:5044}  Family History  Problem Relation Age of Onset   Heart failure Mother    Hypertension Mother    Breast cancer Sister    Diabetes Sister    Social History   Socioeconomic History   Marital status: Married    Spouse name: Not on file   Number of children: Not on file   Years of education: Not on file   Highest education level: Not on file  Occupational History   Not on file  Tobacco Use   Smoking status: Every Day    Packs/day: 0.25    Years: 38.00    Total pack years: 9.50    Types: Cigarettes   Smokeless tobacco: Never   Tobacco comments:    3 per day.   Vaping Use   Vaping Use: Never used  Substance and Sexual Activity   Alcohol use: No    Comment:     Drug use: No   Sexual activity: Yes    Birth control/protection: Surgical  Other Topics Concern   Not on file  Social History Narrative   Not on file   Social Determinants of Health   Financial Resource Strain: Not on file  Food Insecurity: Not on file  Transportation Needs: Not on file  Physical Activity: Not on file  Stress: Not on file  Social Connections: Not on file   Past Surgical History:  Procedure Laterality Date   ABDOMINAL HYSTERECTOMY  04/06/2012   Procedure: HYSTERECTOMY ABDOMINAL;  Surgeon: Margarette Asal, MD;  Location: [facility];  Service:  Gynecology;  Laterality: N/A;   BREAST SURGERY     benign turmor removed   SALPINGOOPHORECTOMY  04/06/2012   Procedure: SALPINGO OOPHORECTOMY;  Surgeon: Margarette Asal, MD;  Location: [facility];  Service: Gynecology;  Laterality: Bilateral;   TUBAL LIGATION     Past Surgical History:  Procedure Laterality Date   ABDOMINAL HYSTERECTOMY  04/06/2012   Procedure: HYSTERECTOMY ABDOMINAL;  Surgeon: Margarette Asal, MD;  Location: [facility];  Service: Gynecology;  Laterality: N/A;   BREAST SURGERY     benign turmor removed   SALPINGOOPHORECTOMY  04/06/2012   Procedure: SALPINGO OOPHORECTOMY;  Surgeon: Margarette Asal, MD;  Location: [facility];  Service: Gynecology;  Laterality: Bilateral;   TUBAL LIGATION     Past Medical History:  Diagnosis Date   DVT (deep venous thrombosis) ([facility])    Headache(784.0)    otc meds prn    History of hysterectomy 2014   Hyperlipidemia    Hypertension 09/2018   Plantar warts 08/2019   SVD (spontaneous vaginal delivery)    x 5   Vitamin D deficiency 09/2018   LMP 06/13/2011   Opioid Risk Score:   Fall Risk Score:  `1  Depression screen [facility] 2/9     03/30/2022   11:45 AM 10/25/2018    2:56 PM 09/14/2018    8:08 AM 02/08/2017  9:11 AM 01/18/2017    8:09 AM 01/11/2017    8:43 AM 12/02/2016    9:14 AM  Depression screen PHQ 2/9  Decreased Interest 2 0 0 0 0 0 0  Down, Depressed, Hopeless 1 0 0 0 0 0 0  PHQ - 2 Score 3 0 0 0 0 0 0  Altered sleeping 3        Tired, decreased energy 3        Change in appetite 0        Feeling bad or failure about yourself  0        Trouble concentrating 0        Moving slowly or fidgety/restless 3        Suicidal thoughts 0        PHQ-9 Score 12        Difficult doing work/chores Not difficult at all            Review of Systems     Objective:   Physical Exam        Assessment & Plan:

## 2022-04-30 ENCOUNTER — Ambulatory Visit (INDEPENDENT_AMBULATORY_CARE_PROVIDER_SITE_OTHER): Payer: Medicare HMO | Admitting: Pulmonary Disease

## 2022-04-30 ENCOUNTER — Encounter: Payer: Self-pay | Admitting: Pulmonary Disease

## 2022-04-30 DIAGNOSIS — F17210 Nicotine dependence, cigarettes, uncomplicated: Secondary | ICD-10-CM

## 2022-04-30 DIAGNOSIS — Z122 Encounter for screening for malignant neoplasm of respiratory organs: Secondary | ICD-10-CM

## 2022-04-30 DIAGNOSIS — Z87891 Personal history of nicotine dependence: Secondary | ICD-10-CM

## 2022-04-30 NOTE — Patient Instructions (Signed)
Thank you for participating in the [facility] Screening Program. It was our pleasure to meet you today. We will call you with the results of your scan within the next few days. Your scan will be assigned a Lung RADS category score by the physicians reading the scans.  This Lung RADS score determines follow up scanning.  See below for description of categories, and follow up screening recommendations. We will be in touch to schedule your follow up screening annually or based on recommendations of our providers. We will fax a copy of your scan results to your Primary Care Physician, or the physician who referred you to the program, to ensure they have the results. Please call the office if you have any questions or concerns regarding your scanning experience or results.  Our office number is [phone]. Please speak with Denise Phelps, RN. , or  Denise Buckner RN, They are  our Lung Cancer Screening RN.'s If They are unavailable when you call, Please leave a message on the voice mail. We will return your call at our earliest convenience.This voice mail is monitored several times a day.  Remember, if your scan is normal, we will scan you annually as long as you continue to meet the criteria for the program. (Age 50-80, Current smoker or smoker who has quit within the last 15 years). If you are a smoker, remember, quitting is the single most powerful action that you can take to decrease your risk of lung cancer and other pulmonary, breathing related problems. We know quitting is hard, and we are here to help.  Please let us know if there is anything we can do to help you meet your goal of quitting. If you are a former smoker, congratulations. We are proud of you! Remain smoke free! Remember you can refer friends or family members through the number above.  We will screen them to make sure they meet criteria for the program. Thank you for helping us take better care of you by  participating in Lung Screening.  You can receive free nicotine replacement therapy ( patches, gum or mints) by calling 1-800-QUIT NOW. Please call so we can get you on the path to becoming  a non-smoker. I know it is hard, but you can do this!  Lung RADS Categories:  Lung RADS 1: no nodules or definitely non-concerning nodules.  Recommendation is for a repeat annual scan in 12 months.  Lung RADS 2:  nodules that are non-concerning in appearance and behavior with a very low likelihood of becoming an active cancer. Recommendation is for a repeat annual scan in 12 months.  Lung RADS 3: nodules that are probably non-concerning , includes nodules with a low likelihood of becoming an active cancer.  Recommendation is for a 6-month repeat screening scan. Often noted after an upper respiratory illness. We will be in touch to make sure you have no questions, and to schedule your 6-month scan.  Lung RADS 4 A: nodules with concerning findings, recommendation is most often for a follow up scan in 3 months or additional testing based on our provider's assessment of the scan. We will be in touch to make sure you have no questions and to schedule the recommended 3 month follow up scan.  Lung RADS 4 B:  indicates findings that are concerning. We will be in touch with you to schedule additional diagnostic testing based on our provider's  assessment of the scan.  Other options for assistance in smoking cessation (   As covered by your insurance benefits)  Hypnosis for smoking cessation  [employer] [phone]  Acupuncture for smoking cessation  [facility] [phone]   

## 2022-04-30 NOTE — Progress Notes (Addendum)
Virtual Visit via Telephone Note  I connected with Britt Bottom on 04/30/22 at 11 AM EST by telephone and verified that I am speaking with the correct person using two identifiers.  Location: Patient: Home Provider: [address] [place] [address]     Shared Decision Making Visit Lung Cancer Screening Program [phone])   Eligibility: Age 62 y.o. Pack Years Smoking History Calculation 100 (# packs/per year x # years smoked) Recent History of coughing up blood  no Unexplained weight loss? no ( >Than 15 pounds within the last 6 months ) Prior History Lung / other cancer no (Diagnosis within the last 5 years already requiring surveillance chest CT Scans). Smoking Status Current Smoker - was smoking 2 ppd, now down to 2-3 cigarettes a day   Visit Components: Discussion included one or more decision making aids. yes Discussion included risk/benefits of screening. yes Discussion included potential follow up diagnostic testing for abnormal scans. yes Discussion included meaning and risk of over diagnosis. yes Discussion included meaning and risk of False Positives. yes Discussion included meaning of total radiation exposure. yes  Counseling Included: Importance of adherence to annual lung cancer LDCT screening. yes Impact of comorbidities on ability to participate in the program. yes Ability and willingness to under diagnostic treatment. yes  Smoking Cessation Counseling: Current Smokers:  Discussed importance of smoking cessation. yes Information about tobacco cessation classes and interventions provided to patient. yes Patient provided with "ticket" for LDCT Scan. yes Asymptomatic Patient yes  Counseling (Intermediate counseling: > three minutes counseling) [id number] Patient provided with "ticket" for LDCT Scan. yes Written Order for Lung Cancer Screening with LDCT placed in Epic. Yes (CT Chest Lung Cancer Screening Low Dose W/O CM) [id number] Z12.2-Screening of  respiratory organs Z87.891-Personal history of nicotine dependence   Lauraine Rinne, NP

## 2022-05-05 ENCOUNTER — Inpatient Hospital Stay: Admission: RE | Admit: 2022-05-05 | Payer: 59 | Source: Ambulatory Visit

## 2022-05-07 ENCOUNTER — Ambulatory Visit
Admission: RE | Admit: 2022-05-07 | Discharge: 2022-05-07 | Disposition: A | Discharge status: Home / Self Care | Payer: Medicare HMO | Source: Ambulatory Visit | Attending: Adult Health | Admitting: Adult Health

## 2022-05-07 DIAGNOSIS — F17210 Nicotine dependence, cigarettes, uncomplicated: Secondary | ICD-10-CM | POA: Diagnosis not present

## 2022-05-07 DIAGNOSIS — I771 Stricture of artery: Secondary | ICD-10-CM | POA: Diagnosis not present

## 2022-05-07 DIAGNOSIS — J432 Centrilobular emphysema: Secondary | ICD-10-CM | POA: Diagnosis not present

## 2022-05-07 DIAGNOSIS — J8410 Pulmonary fibrosis, unspecified: Secondary | ICD-10-CM | POA: Diagnosis not present

## 2022-05-07 DIAGNOSIS — Z87891 Personal history of nicotine dependence: Secondary | ICD-10-CM

## 2022-05-11 ENCOUNTER — Other Ambulatory Visit: Payer: Self-pay

## 2022-05-11 DIAGNOSIS — F17210 Nicotine dependence, cigarettes, uncomplicated: Secondary | ICD-10-CM

## 2022-05-11 DIAGNOSIS — Z122 Encounter for screening for malignant neoplasm of respiratory organs: Secondary | ICD-10-CM

## 2022-05-11 DIAGNOSIS — Z87891 Personal history of nicotine dependence: Secondary | ICD-10-CM

## 2022-05-15 ENCOUNTER — Encounter: Payer: Medicare HMO | Admitting: Family

## 2022-05-15 ENCOUNTER — Ambulatory Visit: Payer: 59 | Admitting: Adult Health

## 2022-05-15 NOTE — Progress Notes (Signed)
  This encounter was created in error - please disregard. No show 

## 2022-05-20 ENCOUNTER — Other Ambulatory Visit (HOSPITAL_COMMUNITY): Payer: Self-pay

## 2022-05-20 ENCOUNTER — Other Ambulatory Visit: Payer: Self-pay

## 2022-05-20 ENCOUNTER — Encounter: Payer: Self-pay | Admitting: Family

## 2022-05-20 ENCOUNTER — Ambulatory Visit (INDEPENDENT_AMBULATORY_CARE_PROVIDER_SITE_OTHER): Payer: Medicare HMO | Admitting: Family

## 2022-05-20 VITALS — BP 132/88 | HR 69 | Temp 98.4°F | Resp 18 | Ht 66.0 in | Wt 178.4 lb

## 2022-05-20 DIAGNOSIS — M5442 Lumbago with sciatica, left side: Secondary | ICD-10-CM | POA: Diagnosis not present

## 2022-05-20 DIAGNOSIS — I10 Essential (primary) hypertension: Secondary | ICD-10-CM

## 2022-05-20 DIAGNOSIS — M25512 Pain in left shoulder: Secondary | ICD-10-CM | POA: Diagnosis not present

## 2022-05-20 DIAGNOSIS — G8929 Other chronic pain: Secondary | ICD-10-CM | POA: Diagnosis not present

## 2022-05-20 DIAGNOSIS — E782 Mixed hyperlipidemia: Secondary | ICD-10-CM | POA: Diagnosis not present

## 2022-05-20 DIAGNOSIS — M5441 Lumbago with sciatica, right side: Secondary | ICD-10-CM | POA: Diagnosis not present

## 2022-05-20 DIAGNOSIS — A599 Trichomoniasis, unspecified: Secondary | ICD-10-CM

## 2022-05-20 MED ORDER — METRONIDAZOLE 500 MG PO TABS
500.0000 mg | ORAL_TABLET | Freq: Two times a day (BID) | ORAL | 0 refills | Status: DC
  Filled 2022-05-20 (×2): qty 14, 7d supply, fill #0

## 2022-05-20 NOTE — Patient Instructions (Signed)
-   Please get  left shoulder X-ray at  imaging at [address] then will call you with results.  

## 2022-05-20 NOTE — Progress Notes (Signed)
Provider: Marlowe Sax FNP-C   Medina-Vargas, Senaida Lange, NP  Patient Care Team: Nickola Major, NP as PCP - General (Internal Medicine)  Extended Emergency Contact Information Primary Emergency Contact: Theador Hawthorne Address: [address]           [place], [place] [address] Johnnette Litter of [place] Phone: [phone] Mobile Phone: [phone] Relation: Daughter  Code Status:  Full Code  Goals of care: Advanced Directive information    01/22/2019    8:58 PM  Advanced Directives  Does Patient Have a Medical Advance Directive? No  Would patient like information on creating a medical advance directive? No - Patient declined     Chief Complaint  Patient presents with   Follow-up    Three month follow up.    HPI:  Pt is a 62 y.o. female seen today for 3 months follow up for medical management of chronic diseases.  Has medical history of essential hypertension,Hyperlipidemia, history of DVT, cervical disc disorder with radiculopathy, low back pain, hot flashes, right lower extremity pain among other conditions. States did not pick up her flagyl since she did not have any money.still having vaginal itchy,odor and discharge.  Also complains of left shoulder pain states unable to raise hand overhead or reach to the back. Sometimes has some numbness on her fingers. She denies any weakness on hand.also denies any injuries to neck or shoulder.  She request pain medication for neck,shoulder and lower back and leg. States no longer takes Gabapentin since it does not do any good for her pain.   Past Medical History:  Diagnosis Date   DVT (deep venous thrombosis) (HCC)    Headache(784.0)    otc meds prn    History of hysterectomy 2014   Hyperlipidemia    Hypertension 09/2018   Plantar warts 08/2019   SVD (spontaneous vaginal delivery)    x 5   Vitamin D deficiency 09/2018   Past Surgical History:  Procedure Laterality Date   ABDOMINAL HYSTERECTOMY  04/06/2012    Procedure: HYSTERECTOMY ABDOMINAL;  Surgeon: Margarette Asal, MD;  Location: [facility];  Service: Gynecology;  Laterality: N/A;   BREAST SURGERY     benign turmor removed   SALPINGOOPHORECTOMY  04/06/2012   Procedure: SALPINGO OOPHORECTOMY;  Surgeon: Margarette Asal, MD;  Location: [facility];  Service: Gynecology;  Laterality: Bilateral;   TUBAL LIGATION      No Known Allergies  Allergies as of 05/20/2022   No Known Allergies      Medication List        Accurate as of May 20, 2022  2:50 PM. If you have any questions, ask your nurse or doctor.          STOP taking these medications    cephALEXin 500 MG capsule Commonly known as: KEFLEX Stopped by: Nelda Bucks Antanette Richwine, NP   gabapentin 300 MG capsule Commonly known as: NEURONTIN Stopped by: Sandrea Hughs, NP   HYDROcodone-acetaminophen 5-325 MG tablet Commonly known as: Norco Stopped by: Sandrea Hughs, NP   ondansetron 4 MG tablet Commonly known as: Zofran Stopped by: Sandrea Hughs, NP   ondansetron 8 MG disintegrating tablet Commonly known as: ZOFRAN-ODT Stopped by: Sandrea Hughs, NP       TAKE these medications    amLODipine 10 MG tablet Commonly known as: NORVASC Take 1 tablet (10 mg total) by mouth daily.   DULoxetine 30 MG capsule Commonly known as: CYMBALTA Take 2 capsules (60 mg total) by  mouth daily.   metroNIDAZOLE 500 MG tablet Commonly known as: FLAGYL Take 1 tablet (500 mg total) by mouth 2 (two) times daily.   Narcan 4 MG/0.1ML Liqd nasal spray kit Generic drug: naloxone   pravastatin 80 MG tablet Commonly known as: PRAVACHOL Take 1 tablet (80 mg total) by mouth daily.   tiZANidine 2 MG tablet Commonly known as: ZANAFLEX Take 1 tablet (2 mg total) by mouth every 8 (eight) hours as needed for muscle spasms.   Vitamin D (Ergocalciferol) 1.25 MG (50000 UNIT) Caps capsule Commonly known as: DRISDOL Take 1 capsule (50,000 Units total) by mouth every 7 (seven) days.   Xarelto 10  MG Tabs tablet Generic drug: rivaroxaban Take 1 tablet (10 mg total) by mouth daily.        Review of Systems  Constitutional:  Negative for appetite change, chills, fatigue, fever and unexpected weight change.  HENT:  Negative for congestion, dental problem, ear discharge, ear pain, facial swelling, hearing loss, nosebleeds, postnasal drip, rhinorrhea, sinus pressure, sinus pain, sneezing, sore throat, tinnitus and trouble swallowing.   Eyes:  Negative for pain, discharge, redness, itching and visual disturbance.  Respiratory:  Negative for cough, chest tightness, shortness of breath and wheezing.   Cardiovascular:  Negative for chest pain, palpitations and leg swelling.  Gastrointestinal:  Negative for abdominal distention, abdominal pain, blood in stool, constipation, diarrhea, nausea and vomiting.  Endocrine: Negative for cold intolerance, heat intolerance, polydipsia, polyphagia and polyuria.  Genitourinary:  Negative for difficulty urinating, dysuria, flank pain, frequency and urgency.  Musculoskeletal:  Positive for arthralgias and back pain. Negative for gait problem, joint swelling, myalgias, neck pain and neck stiffness.  Skin:  Negative for color change, pallor, rash and wound.  Neurological:  Negative for dizziness, syncope, speech difficulty, weakness, light-headedness, numbness and headaches.  Hematological:  Does not bruise/bleed easily.  Psychiatric/Behavioral:  Negative for agitation, behavioral problems, confusion, hallucinations, self-injury, sleep disturbance and suicidal ideas. The patient is not nervous/anxious.      There is no immunization history on file for this patient. Pertinent  Health Maintenance Due  Topic Date Due   COLONOSCOPY (Pts 45-76yr Insurance coverage will need to be confirmed)  Never done   PAP SMEAR-Modifier  12/03/2019   INFLUENZA VACCINE  06/07/2022 (Originally 10/07/2021)   MAMMOGRAM  03/23/2024      10/25/2018    2:56 PM 01/22/2019     8:58 PM 02/24/2019    3:22 PM 03/30/2022   11:40 AM 05/20/2022    2:33 PM  Fall Risk  Falls in the past year? 0  0 1 0  Was there an injury with Fall?   0 0 0  Fall Risk Category Calculator   0 1 0  Fall Risk Category (Retired)   Low    (RETIRED) Patient Fall Risk Level Low fall risk Low fall risk     Patient at Risk for Falls Due to     No Fall Risks  Fall risk Follow up     Falls evaluation completed   Functional Status Survey:    Vitals:   05/20/22 1437  BP: 132/88  Pulse: 69  Resp: 18  Temp: 98.4 F (36.9 C)  SpO2: 96%  Weight: 178 lb 6 oz (80.9 kg)  Height: '5\' 6"'$  (1.676 m)   Body mass index is 28.79 kg/m. Physical Exam Vitals reviewed.  Constitutional:      General: She is not in acute distress.    Appearance: Normal appearance. She is overweight. She  is not ill-appearing or diaphoretic.  HENT:     Head: Normocephalic.     Right Ear: Tympanic membrane, ear canal and external ear normal. There is no impacted cerumen.     Left Ear: Tympanic membrane, ear canal and external ear normal. There is no impacted cerumen.     Nose: Nose normal. No congestion or rhinorrhea.     Mouth/Throat:     Mouth: Mucous membranes are moist.     Pharynx: Oropharynx is clear. No oropharyngeal exudate or posterior oropharyngeal erythema.  Eyes:     General: No scleral icterus.       Right eye: No discharge.        Left eye: No discharge.     Extraocular Movements: Extraocular movements intact.     Conjunctiva/sclera: Conjunctivae normal.     Pupils: Pupils are equal, round, and reactive to light.  Neck:     Vascular: No carotid bruit.  Cardiovascular:     Rate and Rhythm: Normal rate and regular rhythm.     Pulses: Normal pulses.     Heart sounds: Normal heart sounds. No murmur heard.    No friction rub. No gallop.  Pulmonary:     Effort: Pulmonary effort is normal. No respiratory distress.     Breath sounds: Normal breath sounds. No wheezing, rhonchi or rales.  Chest:     Chest  wall: No tenderness.  Abdominal:     General: Bowel sounds are normal. There is no distension.     Palpations: Abdomen is soft. There is no mass.     Tenderness: There is no abdominal tenderness. There is no right CVA tenderness, left CVA tenderness, guarding or rebound.  Musculoskeletal:        General: No swelling.     Right shoulder: Normal.     Left shoulder: Tenderness present. No swelling, effusion or crepitus. Decreased range of motion. Normal strength. Normal pulse.     Cervical back: Normal range of motion. No rigidity or tenderness.     Right lower leg: No edema.     Left lower leg: No edema.  Lymphadenopathy:     Cervical: No cervical adenopathy.  Skin:    General: Skin is warm and dry.     Coloration: Skin is not pale.     Findings: No bruising, erythema, lesion or rash.  Neurological:     Mental Status: She is alert and oriented to person, place, and time.     Cranial Nerves: No cranial nerve deficit.     Sensory: No sensory deficit.     Motor: No weakness.     Coordination: Coordination normal.     Gait: Gait normal.  Psychiatric:        Mood and Affect: Mood normal.        Speech: Speech normal.        Behavior: Behavior normal.        Thought Content: Thought content normal.        Judgment: Judgment normal.     Labs reviewed: Recent Labs    02/03/22 1348  NA 144  K 3.9  CL 110  CO2 28  GLUCOSE 95  BUN 20  CREATININE 0.95  CALCIUM 9.6   Recent Labs    02/03/22 1348  AST 11  ALT 11  BILITOT 0.3  PROT 6.7   Recent Labs    02/03/22 1348  WBC 6.0  NEUTROABS 4,212  HGB 12.9  HCT 40.1  MCV 89.9  PLT  309   Lab Results  Component Value Date   TSH 1.030 09/14/2018   Lab Results  Component Value Date   HGBA1C 5.7 (H) 02/03/2022   Lab Results  Component Value Date   CHOL 237 (H) 02/03/2022   HDL 45 (L) 02/03/2022   LDLCALC 170 (H) 02/03/2022   TRIG 103 02/03/2022   CHOLHDL 5.3 (H) 02/03/2022    Significant Diagnostic Results in  last 30 days:  CT CHEST LUNG CA SCREEN LOW DOSE W/O CM  Result Date: 05/08/2022 CLINICAL DATA:  100 pack-year smoking history/current smoker. History of lumpectomy for benign etiology EXAM: CT CHEST WITHOUT CONTRAST LOW-DOSE FOR LUNG CANCER SCREENING TECHNIQUE: Multidetector CT imaging of the chest was performed following the standard protocol without IV contrast. RADIATION DOSE REDUCTION: This exam was performed according to the departmental dose-optimization program which includes automated exposure control, adjustment of the mA and/or kV according to patient size and/or use of iterative reconstruction technique. COMPARISON:  10/09/2014 CTA chest.  No prior screening CT FINDINGS: Cardiovascular: Aortic atherosclerosis. Tortuous thoracic aorta. Mild cardiomegaly, without pericardial effusion. Mediastinum/Nodes: No mediastinal or hilar adenopathy, given limitations of unenhanced CT. Fluid level in the esophagus on 25/2. Lungs/Pleura: No pleural fluid. Mild centrilobular emphysema. Minimal motion degradation. Dependent ground-glass in the lower lobes and less so posterior upper lobes is likely due to air trapping. Left lower lobe calcified granuloma. A right middle lobe solid nodule of volume derived equivalent diameter 3.0 mm. Upper Abdomen: Normal imaged portions of the liver, spleen, stomach, pancreas, gallbladder, right adrenal gland. Punctate upper pole renal collecting system calculi bilaterally. Left adrenal 2.0 cm nodule is new or enlarged compared to 2016 and measures 8.5 HU. Musculoskeletal: No acute osseous abnormality. IMPRESSION: 1. Lung-RADS 2, benign appearance or behavior. Continue annual screening with low-dose chest CT without contrast in 12 months. 2. Esophageal air fluid level suggests dysmotility or gastroesophageal reflux. 3. Bilateral nephrolithiasis. 4. Left adrenal adenoma . In the absence of clinically indicated signs/symptoms require(s) no independent follow-up. 5. Aortic Atherosclerosis  (ICD10-I70.0) and Emphysema (ICD10-J43.9). Electronically Signed   By: Abigail Miyamoto M.D.   On: 05/08/2022 12:05   Assessment/Plan 1. Trichomonas vaginalis infection Prescription prescribed previously but did not fill due to cost. Abdominal pain or tenderness on exam - metroNIDAZOLE (FLAGYL) 500 MG tablet; Take 1 tablet (500 mg total) by mouth 2 (two) times daily.  Dispense: 14 tablet; Refill: 0  2. Primary hypertension Blood pressure stable -Continue on amlodipine - TSH; Future - COMPLETE METABOLIC PANEL WITH GFR; Future - CBC with Differential/Platelet; Future  3. Mixed hyperlipidemia DL not at goal Continue on pravastatin -Dietary modification and exercise advised - Lipid panel; Future  4. Chronic bilateral low back pain with bilateral sciatica Chronic -Will refer to orthopedic for further evaluation -Continue with physical therapy - Ambulatory referral to Orthopedic Surgery  5. Chronic left shoulder pain Worsening shoulder pain able to reach overhead all the back.  Will obtain imaging and refer to orthopedic for further evaluation - DG Shoulder Left; Future - Ambulatory referral to Orthopedic Surgery  Family/ staff Communication: Reviewed plan of care with patient verbalized understanding  Labs/tests ordered:  - TSH; Future - COMPLETE METABOLIC PANEL WITH GFR; Future - CBC with Differential/Platelet; Future - Lipid panel; Future - DG Shoulder Left; Future  Next Appointment : Return in about 6 months (around 11/20/2022) for medical mangement of chronic issues., fasting labs in one week.   Sandrea Hughs, NP

## 2022-05-21 ENCOUNTER — Telehealth: Payer: Self-pay | Admitting: Acute Care

## 2022-05-21 ENCOUNTER — Other Ambulatory Visit: Payer: Self-pay

## 2022-05-21 NOTE — Telephone Encounter (Signed)
Attempt to contact patient by phone to review results of LDCT.  Letter had been mailed to home but returned to [place].  There was no answer on mobile phone and VM is full so unable to leave a message.  Home phone is listed as daughter's phone.  Will attempt to call cell phone again later today.

## 2022-05-22 ENCOUNTER — Other Ambulatory Visit (HOSPITAL_COMMUNITY): Payer: Self-pay

## 2022-05-26 ENCOUNTER — Ambulatory Visit: Payer: Medicare HMO | Admitting: Physician Assistant

## 2022-05-27 ENCOUNTER — Other Ambulatory Visit: Payer: Medicare HMO

## 2022-05-27 DIAGNOSIS — I10 Essential (primary) hypertension: Secondary | ICD-10-CM

## 2022-05-27 DIAGNOSIS — E782 Mixed hyperlipidemia: Secondary | ICD-10-CM

## 2022-05-28 ENCOUNTER — Other Ambulatory Visit: Payer: Medicare HMO

## 2022-05-28 DIAGNOSIS — E782 Mixed hyperlipidemia: Secondary | ICD-10-CM | POA: Diagnosis not present

## 2022-05-28 DIAGNOSIS — I10 Essential (primary) hypertension: Secondary | ICD-10-CM | POA: Diagnosis not present

## 2022-05-29 LAB — COMPLETE METABOLIC PANEL WITH GFR
AG Ratio: 1.7 (calc) (ref 1.0–2.5)
ALT: 10 U/L (ref 6–29)
AST: 12 U/L (ref 10–35)
Albumin: 4.2 g/dL (ref 3.6–5.1)
Alkaline phosphatase (APISO): 70 U/L (ref 37–153)
BUN: 16 mg/dL (ref 7–25)
CO2: 22 mmol/L (ref 20–32)
Calcium: 9.5 mg/dL (ref 8.6–10.4)
Chloride: 110 mmol/L (ref 98–110)
Creat: 0.72 mg/dL (ref 0.50–1.05)
Globulin: 2.5 g/dL (calc) (ref 1.9–3.7)
Glucose, Bld: 86 mg/dL (ref 65–99)
Potassium: 4.2 mmol/L (ref 3.5–5.3)
Sodium: 143 mmol/L (ref 135–146)
Total Bilirubin: 0.5 mg/dL (ref 0.2–1.2)
Total Protein: 6.7 g/dL (ref 6.1–8.1)
eGFR: 94 mL/min/{1.73_m2} (ref >=60)

## 2022-05-29 LAB — LIPID PANEL
Cholesterol: 269 mg/dL — ABNORMAL HIGH (ref <200)
HDL: 42 mg/dL — ABNORMAL LOW (ref >=50)
LDL Cholesterol (Calc): 210 mg/dL (calc) — ABNORMAL HIGH
Non-HDL Cholesterol (Calc): 227 mg/dL (calc) — ABNORMAL HIGH (ref <130)
Total CHOL/HDL Ratio: 6.4 (calc) — ABNORMAL HIGH (ref <5.0)
Triglycerides: 67 mg/dL (ref <150)

## 2022-05-29 LAB — CBC WITH DIFFERENTIAL/PLATELET
Absolute Monocytes: 304 cells/uL (ref 200–950)
Basophils Absolute: 12 cells/uL (ref 0–200)
Basophils Relative: 0.3 %
Eosinophils Absolute: 80 cells/uL (ref 15–500)
Eosinophils Relative: 2 %
HCT: 43.3 % (ref 35.0–45.0)
Hemoglobin: 14 g/dL (ref 11.7–15.5)
Lymphs Abs: 1512 cells/uL (ref 850–3900)
MCH: 29.1 pg (ref 27.0–33.0)
MCHC: 32.3 g/dL (ref 32.0–36.0)
MCV: 90 fL (ref 80.0–100.0)
MPV: 10.5 fL (ref 7.5–12.5)
Monocytes Relative: 7.6 %
Neutro Abs: 2092 cells/uL (ref 1500–7800)
Neutrophils Relative %: 52.3 %
Platelets: 247 10*3/uL (ref 140–400)
RBC: 4.81 10*6/uL (ref 3.80–5.10)
RDW: 12.7 % (ref 11.0–15.0)
Total Lymphocyte: 37.8 %
WBC: 4 10*3/uL (ref 3.8–10.8)

## 2022-05-29 LAB — TSH: TSH: 1.09 mIU/L (ref 0.40–4.50)

## 2022-06-17 ENCOUNTER — Encounter: Payer: Self-pay | Admitting: Family

## 2022-06-17 ENCOUNTER — Ambulatory Visit (INDEPENDENT_AMBULATORY_CARE_PROVIDER_SITE_OTHER): Payer: 59 | Admitting: Family

## 2022-06-17 VITALS — BP 130/72 | HR 69 | Temp 97.5°F | Resp 16 | Ht 65.55 in | Wt 175.4 lb

## 2022-06-17 DIAGNOSIS — Z0000 Encounter for general adult medical examination without abnormal findings: Secondary | ICD-10-CM

## 2022-06-17 NOTE — Progress Notes (Signed)
Subjective:   [name] is a 62 y.o. female who presents for Medicare Annual (Subsequent) preventive examination.  Review of Systems     Cardiac Risk Factors include: advanced age (>18men, >5 women);hypertension;smoking/ tobacco exposure;dyslipidemia;sedentary lifestyle     Objective:    Today's Vitals   06/17/22 1423 06/17/22 1448  BP: 130/72   Pulse: 69   Resp: 16   Temp: (!) 97.5 F (36.4 C)   TempSrc: Temporal   SpO2: 97%   Weight: 175 lb 6.4 oz (79.6 kg)   Height: 5' 5.55" (1.665 m)   PainSc:  9    Body mass index is 28.7 kg/m.     06/17/2022    2:21 PM 01/22/2019    8:58 PM 02/05/2016    2:23 PM 01/21/2016    1:43 PM 01/15/2016    3:31 PM 12/25/2015    1:38 PM 12/03/2015   11:49 AM  Advanced Directives  Does Patient Have a Medical Advance Directive? No No No No No No No  Would patient like information on creating a medical advance directive? No - Patient declined No - Patient declined  No - patient declined information;Yes - Educational materials given No - patient declined information No - patient declined information No - patient declined information    Current Medications (verified) Outpatient Encounter Medications as of 06/17/2022  Medication Sig   amLODipine (NORVASC) 10 MG tablet Take 1 tablet (10 mg total) by mouth daily.   DULoxetine (CYMBALTA) 30 MG capsule Take 2 capsules (60 mg total) by mouth daily.   metroNIDAZOLE (FLAGYL) 500 MG tablet Take 1 tablet (500 mg total) by mouth 2 (two) times daily.   NARCAN 4 MG/0.1ML LIQD nasal spray kit    pravastatin (PRAVACHOL) 80 MG tablet Take 1 tablet (80 mg total) by mouth daily.   rivaroxaban (XARELTO) 10 MG TABS tablet Take 1 tablet (10 mg total) by mouth daily.   tiZANidine (ZANAFLEX) 2 MG tablet Take 1 tablet (2 mg total) by mouth every 8 (eight) hours as needed for muscle spasms.   Vitamin D, Ergocalciferol, (DRISDOL) 1.25 MG (50000 UNIT) CAPS capsule Take 1 capsule (50,000 Units total) by mouth  every 7 (seven) days.   No facility-administered encounter medications on file as of 06/17/2022.    Allergies (verified) Patient has no known allergies.   History: Past Medical History:  Diagnosis Date   DVT (deep venous thrombosis)    Headache(784.0)    otc meds prn    History of hysterectomy 2014   Hyperlipidemia    Hypertension 09/2018   Plantar warts 08/2019   SVD (spontaneous vaginal delivery)    x 5   Vitamin D deficiency 09/2018   Past Surgical History:  Procedure Laterality Date   ABDOMINAL HYSTERECTOMY  04/06/2012   Procedure: HYSTERECTOMY ABDOMINAL;  Surgeon: [name], MD;  Location: [facility];  Service: Gynecology;  Laterality: N/A;   BREAST SURGERY     benign turmor removed   SALPINGOOPHORECTOMY  04/06/2012   Procedure: SALPINGO OOPHORECTOMY;  Surgeon: [name], MD;  Location: [facility];  Service: Gynecology;  Laterality: Bilateral;   TUBAL LIGATION     Family History  Problem Relation Age of Onset   Heart failure Mother    Hypertension Mother    Breast cancer Sister    Diabetes Sister    Social History   Socioeconomic History   Marital status: Married    Spouse name: Not on file   Number of children: Not on  file   Years of education: Not on file   Highest education level: Not on file  Occupational History   Not on file  Tobacco Use   Smoking status: Every Day    Packs/day: 0.25    Years: 38.00    Additional pack years: 0.00    Total pack years: 9.50    Types: Cigarettes   Smokeless tobacco: Never   Tobacco comments:    3 per day.   Vaping Use   Vaping Use: Never used  Substance and Sexual Activity   Alcohol use: No    Comment:     Drug use: No   Sexual activity: Yes    Birth control/protection: Surgical  Other Topics Concern   Not on file  Social History Narrative   Not on file   Social Determinants of Health   Financial Resource Strain: Not on file  Food Insecurity: Not on file  Transportation Needs: Not on file   Physical Activity: Not on file  Stress: Not on file  Social Connections: Not on file    Tobacco Counseling Ready to quit: Not Answered Counseling given: Not Answered Tobacco comments: 3 per day.    Clinical Intake:  Pre-visit preparation completed: No  Pain : 0-10 Pain Score: 9  Pain Type: Chronic pain Pain Location: Back Pain Orientation: Lower Pain Radiating Towards: right leg Pain Descriptors / Indicators: Aching Pain Frequency: Constant Pain Relieving Factors: walking Effect of Pain on Daily Activities: sitting,driving  Pain Relieving Factors: walking  BMI - recorded: 28.7 Nutritional Status: BMI 25 -29 Overweight Diabetes: No     Diabetic?No          Activities of Daily Living    06/17/2022    2:54 PM 06/17/2022    2:22 PM  In your present state of health, do you have any difficulty performing the following activities:  Hearing? 0 0  Vision? 0 0  Difficulty concentrating or making decisions? 1 0  Comment Remembering   Walking or climbing stairs? 0 0  Dressing or bathing? 0 0  Doing errands, shopping? 0 0  Preparing Food and eating ? N N  Using the Toilet? N N  In the past six months, have you accidently leaked urine? Y N  Comment sometimes   Do you have problems with loss of bowel control? N N  Managing your Medications? N N  Managing your Finances? N N  Housekeeping or managing your Housekeeping? N N    Patient Care Team: Medina-Vargas, Margit Banda, NP as PCP - General (Internal Medicine)  Indicate any recent Medical Services you may have received from other than [facility] providers in the past year (date may be approximate).     Assessment:   This is a routine wellness examination for [place].  Hearing/Vision screen No results found.  Dietary issues and exercise activities discussed: Current Exercise Habits: Home exercise routine, Type of exercise: walking, Time (Minutes): 30, Frequency (Times/Week): 3, Weekly Exercise (Minutes/Week): 90,  Intensity: Mild, Exercise limited by: orthopedic condition(s) (lower back pain)   Goals Addressed             This Visit's Progress    Patient Stated       Would like to loss weight down to 140's lbs      Patient Stated       Stop smoking        Depression Screen    06/17/2022    2:22 PM 05/20/2022    2:33 PM 03/30/2022  11:45 AM 10/25/2018    2:56 PM 09/14/2018    8:08 AM 02/08/2017    9:11 AM 01/18/2017    8:09 AM  PHQ 2/9 Scores  PHQ - 2 Score 0 0 3 0 0 0 0  PHQ- 9 Score   12        Fall Risk    06/17/2022    2:22 PM 05/20/2022    2:33 PM 03/30/2022   11:40 AM 02/24/2019    3:22 PM 10/25/2018    2:56 PM  Fall Risk   Falls in the past year? 0 0 1 0 0  Number falls in past yr: 1 0 0 0   Injury with Fall? 0 0 0 0   Risk for fall due to : No Fall Risks No Fall Risks     Follow up Falls evaluation completed;Education provided;Falls prevention discussed Falls evaluation completed       FALL RISK PREVENTION PERTAINING TO THE HOME:  Any stairs in or around the home? No  If so, are there any without handrails? No  Home free of loose throw rugs in walkways, pet beds, electrical cords, etc? No  Adequate lighting in your home to reduce risk of falls? Yes   ASSISTIVE DEVICES UTILIZED TO PREVENT FALLS:  Life alert? No  Use of a cane, walker or w/c? Yes  Grab bars in the bathroom? Yes  Shower chair or bench in shower? No  Elevated toilet seat or a handicapped toilet? No   TIMED UP AND GO:  Was the test performed? Yes .  Length of time to ambulate 10 feet: 10 sec.   Gait slow and steady without use of assistive device  Cognitive Function:    06/17/2022    2:23 PM  MMSE - Mini Mental State Exam  Orientation to time 5  Orientation to Place 4  Registration 3  Attention/ Calculation 5  Recall 1  Language- name 2 objects 2  Language- repeat 1  Language- follow 3 step command 3  Language- read & follow direction 1  Write a sentence 1  Copy design 0  Total score  26        Immunizations  There is no immunization history on file for this patient.  Flu Vaccine status: Declined, Education has been provided regarding the importance of this vaccine but patient still declined. Advised may receive this vaccine at local pharmacy or Health Dept. Aware to provide a copy of the vaccination record if obtained from local pharmacy or Health Dept. Verbalized acceptance and understanding.  Flu Vaccine status: Declined, Education has been provided regarding the importance of this vaccine but patient still declined. Advised may receive this vaccine at local pharmacy or Health Dept. Aware to provide a copy of the vaccination record if obtained from local pharmacy or Health Dept. Verbalized acceptance and understanding.  Pneumococcal vaccine status: Declined,  Education has been provided regarding the importance of this vaccine but patient still declined. Advised may receive this vaccine at local pharmacy or Health Dept. Aware to provide a copy of the vaccination record if obtained from local pharmacy or Health Dept. Verbalized acceptance and understanding.   Covid-19 vaccine status: Declined, Education has been provided regarding the importance of this vaccine but patient still declined. Advised may receive this vaccine at local pharmacy or Health Dept.or vaccine clinic. Aware to provide a copy of the vaccination record if obtained from local pharmacy or Health Dept. Verbalized acceptance and understanding.  Qualifies for Shingles Vaccine? Yes  Zostavax completed No   Shingrix Completed?: No.    Education has been provided regarding the importance of this vaccine. Patient has been advised to call insurance company to determine out of pocket expense if they have not yet received this vaccine. Advised may also receive vaccine at local pharmacy or Health Dept. Verbalized acceptance and understanding.  Screening Tests Health Maintenance  Topic Date Due   DTaP/Tdap/Td (1 -  Tdap) Never done   COVID-19 Vaccine (1) 07/03/2022 (Originally 09/23/1960)   Zoster Vaccines- Shingrix (1 of 2) 08/20/2022 (Originally 03/26/2010)   INFLUENZA VACCINE  10/08/2022   Medicare Annual Wellness (AWV)  06/17/2023   MAMMOGRAM  03/23/2024   COLONOSCOPY (Pts 45-33yrs Insurance coverage will need to be confirmed)  02/26/2032   Hepatitis C Screening  Completed   HIV Screening  Completed   HPV VACCINES  Aged Out    Health Maintenance  Health Maintenance Due  Topic Date Due   DTaP/Tdap/Td (1 - Tdap) Never done    Colorectal cancer screening: Type of screening: Colonoscopy. Completed 02/25/2022. Repeat every 10 years  Mammogram status: Completed 1/15/224 . Repeat every year  Bone Density status: Ordered Has appoint in August,2024 . Pt provided with contact info and advised to call to schedule appt.  Lung Cancer Screening: (Low Dose CT Chest recommended if Age 82-80 years, 30 pack-year currently smoking OR have quit w/in 15years.) does qualify.   Lung Cancer Screening Referral: Chest CT done 03/2022   Additional Screening:  Hepatitis C Screening: does qualify; Completed yes   Vision Screening: Recommended annual ophthalmology exams for early detection of glaucoma and other disorders of the eye. Is the patient up to date with their annual eye exam?  No  Who is the provider or what is the name of the office in which the patient attends annual eye exams?[facility]  If pt is not established with a provider, would they like to be referred to a provider to establish care? No .   Dental Screening: Recommended annual dental exams for proper oral hygiene  Community Resource Referral / Chronic Care Management: CRR required this visit?  No   CCM required this visit?  No      Plan:     I have personally reviewed and noted the following in the patient's chart:   Medical and social history Use of alcohol, tobacco or illicit drugs  Current medications and supplements including  opioid prescriptions. Patient is not currently taking opioid prescriptions. Functional ability and status Nutritional status Physical activity Advanced directives List of other physicians Hospitalizations, surgeries, and ER visits in previous 12 months Vitals Screenings to include cognitive, depression, and falls Referrals and appointments  In addition, I have reviewed and discussed with patient certain preventive protocols, quality metrics, and best practice recommendations. A written personalized care plan for preventive services as well as general preventive health recommendations were provided to patient.     Caesar Bookman, NP   06/17/2022   Nurse Notes: declined any vaccine

## 2022-06-17 NOTE — Patient Instructions (Addendum)
[name] , Thank you for taking time to come for your Medicare Wellness Visit. I appreciate your ongoing commitment to your health goals. Please review the following plan we discussed and let me know if I can assist you in the future.   Screening recommendations/referrals: Colonoscopy : Up to date  Mammogram : Up to date  Bone Density Has upcoming appointment  Recommended yearly ophthalmology/optometry visit for glaucoma screening and checkup Recommended yearly dental visit for hygiene and checkup  Vaccinations: Influenza vaccine : declined  Pneumococcal vaccine: declined  Tdap vaccine :declined  Shingles vaccine: declined    COVID vaccine : Declined   Advanced directives: No   Conditions/risks identified: advanced age (>31men, >25 women);hypertension;smoking/ tobacco exposure;dyslipidemia;sedentary lifestyle  Next appointment: 1 year   Preventive Care 40-64 Years, Female Preventive care refers to lifestyle choices and visits with your health care provider that can promote health and wellness. What does preventive care include? A yearly physical exam. This is also called an annual well check. Dental exams once or twice a year. Routine eye exams. Ask your health care provider how often you should have your eyes checked. Personal lifestyle choices, including: Daily care of your teeth and gums. Regular physical activity. Eating a healthy diet. Avoiding tobacco and drug use. Limiting alcohol use. Practicing safe sex. Taking low-dose aspirin daily starting at age 48. Taking vitamin and mineral supplements as recommended by your health care provider. What happens during an annual well check? The services and screenings done by your health care provider during your annual well check will depend on your age, overall health, lifestyle risk factors, and family history of disease. Counseling  Your health care provider may ask you questions about your: Alcohol use. Tobacco use. Drug  use. Emotional well-being. Home and relationship well-being. Sexual activity. Eating habits. Work and work Astronomer. Method of birth control. Menstrual cycle. Pregnancy history. Screening  You may have the following tests or measurements: Height, weight, and BMI. Blood pressure. Lipid and cholesterol levels. These may be checked every 5 years, or more frequently if you are over 57 years old. Skin check. Lung cancer screening. You may have this screening every year starting at age 41 if you have a 30-pack-year history of smoking and currently smoke or have quit within the past 15 years. Fecal occult blood test (FOBT) of the stool. You may have this test every year starting at age 60. Flexible sigmoidoscopy or colonoscopy. You may have a sigmoidoscopy every 5 years or a colonoscopy every 10 years starting at age 55. Hepatitis C blood test. Hepatitis B blood test. Sexually transmitted disease (STD) testing. Diabetes screening. This is done by checking your blood sugar (glucose) after you have not eaten for a while (fasting). You may have this done every 1-3 years. Mammogram. This may be done every 1-2 years. Talk to your health care provider about when you should start having regular mammograms. This may depend on whether you have a family history of breast cancer. BRCA-related cancer screening. This may be done if you have a family history of breast, ovarian, tubal, or peritoneal cancers. Pelvic exam and Pap test. This may be done every 3 years starting at age 26. Starting at age 8, this may be done every 5 years if you have a Pap test in combination with an HPV test. Bone density scan. This is done to screen for osteoporosis. You may have this scan if you are at high risk for osteoporosis. Discuss your test results, treatment options, and  if necessary, the need for more tests with your health care provider. Vaccines  Your health care provider may recommend certain vaccines, such  as: Influenza vaccine. This is recommended every year. Tetanus, diphtheria, and acellular pertussis (Tdap, Td) vaccine. You may need a Td booster every 10 years. Zoster vaccine. You may need this after age 54. Pneumococcal 13-valent conjugate (PCV13) vaccine. You may need this if you have certain conditions and were not previously vaccinated. Pneumococcal polysaccharide (PPSV23) vaccine. You may need one or two doses if you smoke cigarettes or if you have certain conditions. Talk to your health care provider about which screenings and vaccines you need and how often you need them. This information is not intended to replace advice given to you by your health care provider. Make sure you discuss any questions you have with your health care provider. Document Released: 03/22/2015 Document Revised: 11/13/2015 Document Reviewed: 12/25/2014 Elsevier Interactive Patient Education  2017 [employer] Prevention in the Home Falls can cause injuries. They can happen to people of all ages. There are many things you can do to make your home safe and to help prevent falls. What can I do on the outside of my home? Regularly fix the edges of walkways and driveways and fix any cracks. Remove anything that might make you trip as you walk through a door, such as a raised step or threshold. Trim any bushes or trees on the path to your home. Use bright outdoor lighting. Clear any walking paths of anything that might make someone trip, such as rocks or tools. Regularly check to see if handrails are loose or broken. Make sure that both sides of any steps have handrails. Any raised decks and porches should have guardrails on the edges. Have any leaves, snow, or ice cleared regularly. Use sand or salt on walking paths during winter. Clean up any spills in your garage right away. This includes oil or grease spills. What can I do in the bathroom? Use night lights. Install grab bars by the toilet and in  the tub and shower. Do not use towel bars as grab bars. Use non-skid mats or decals in the tub or shower. If you need to sit down in the shower, use a plastic, non-slip stool. Keep the floor dry. Clean up any water that spills on the floor as soon as it happens. Remove soap buildup in the tub or shower regularly. Attach bath mats securely with double-sided non-slip rug tape. Do not have throw rugs and other things on the floor that can make you trip. What can I do in the bedroom? Use night lights. Make sure that you have a light by your bed that is easy to reach. Do not use any sheets or blankets that are too big for your bed. They should not hang down onto the floor. Have a firm chair that has side arms. You can use this for support while you get dressed. Do not have throw rugs and other things on the floor that can make you trip. What can I do in the kitchen? Clean up any spills right away. Avoid walking on wet floors. Keep items that you use a lot in easy-to-reach places. If you need to reach something above you, use a strong step stool that has a grab bar. Keep electrical cords out of the way. Do not use floor polish or wax that makes floors slippery. If you must use wax, use non-skid floor wax. Do not have  throw rugs and other things on the floor that can make you trip. What can I do with my stairs? Do not leave any items on the stairs. Make sure that there are handrails on both sides of the stairs and use them. Fix handrails that are broken or loose. Make sure that handrails are as long as the stairways. Check any carpeting to make sure that it is firmly attached to the stairs. Fix any carpet that is loose or worn. Avoid having throw rugs at the top or bottom of the stairs. If you do have throw rugs, attach them to the floor with carpet tape. Make sure that you have a light switch at the top of the stairs and the bottom of the stairs. If you do not have them, ask someone to add them  for you. What else can I do to help prevent falls? Wear shoes that: Do not have high heels. Have rubber bottoms. Are comfortable and fit you well. Are closed at the toe. Do not wear sandals. If you use a stepladder: Make sure that it is fully opened. Do not climb a closed stepladder. Make sure that both sides of the stepladder are locked into place. Ask someone to hold it for you, if possible. Clearly mark and make sure that you can see: Any grab bars or handrails. First and last steps. Where the edge of each step is. Use tools that help you move around (mobility aids) if they are needed. These include: Canes. Walkers. Scooters. Crutches. Turn on the lights when you go into a dark area. Replace any light bulbs as soon as they burn out. Set up your furniture so you have a clear path. Avoid moving your furniture around. If any of your floors are uneven, fix them. If there are any pets around you, be aware of where they are. Review your medicines with your doctor. Some medicines can make you feel dizzy. This can increase your chance of falling. Ask your doctor what other things that you can do to help prevent falls. This information is not intended to replace advice given to you by your health care provider. Make sure you discuss any questions you have with your health care provider. Document Released: 12/20/2008 Document Revised: 08/01/2015 Document Reviewed: 03/30/2014 Elsevier Interactive Patient Education  2017 [employer].

## 2022-08-27 ENCOUNTER — Other Ambulatory Visit: Payer: Self-pay

## 2022-10-09 ENCOUNTER — Other Ambulatory Visit: Payer: Self-pay

## 2022-10-13 ENCOUNTER — Other Ambulatory Visit: Payer: Self-pay

## 2022-11-02 ENCOUNTER — Inpatient Hospital Stay: Admission: RE | Admit: 2022-11-02 | Payer: Medicare HMO | Source: Ambulatory Visit

## 2022-11-20 ENCOUNTER — Ambulatory Visit: Payer: Medicare HMO | Admitting: Adult Health

## 2022-11-21 ENCOUNTER — Encounter (HOSPITAL_COMMUNITY)
Admission: EM | Disposition: A | Discharge status: Home / Self Care | Payer: Self-pay | Source: Home / Self Care | Attending: Cardiology

## 2022-11-21 ENCOUNTER — Encounter (HOSPITAL_COMMUNITY): Payer: Self-pay

## 2022-11-21 ENCOUNTER — Inpatient Hospital Stay (HOSPITAL_COMMUNITY)
Admission: EM | Admit: 2022-11-21 | Discharge: 2022-11-22 | DRG: 322 | Disposition: A | Discharge status: Home / Self Care | Payer: Medicare HMO | Attending: Cardiovascular Disease | Admitting: Cardiovascular Disease

## 2022-11-21 ENCOUNTER — Inpatient Hospital Stay (HOSPITAL_COMMUNITY): Payer: Medicare HMO

## 2022-11-21 DIAGNOSIS — Z8249 Family history of ischemic heart disease and other diseases of the circulatory system: Secondary | ICD-10-CM

## 2022-11-21 DIAGNOSIS — Z79899 Other long term (current) drug therapy: Secondary | ICD-10-CM | POA: Diagnosis not present

## 2022-11-21 DIAGNOSIS — I2510 Atherosclerotic heart disease of native coronary artery without angina pectoris: Secondary | ICD-10-CM | POA: Diagnosis present

## 2022-11-21 DIAGNOSIS — Z90722 Acquired absence of ovaries, bilateral: Secondary | ICD-10-CM | POA: Diagnosis not present

## 2022-11-21 DIAGNOSIS — Z833 Family history of diabetes mellitus: Secondary | ICD-10-CM | POA: Diagnosis not present

## 2022-11-21 DIAGNOSIS — I10 Essential (primary) hypertension: Secondary | ICD-10-CM | POA: Diagnosis present

## 2022-11-21 DIAGNOSIS — Z7901 Long term (current) use of anticoagulants: Secondary | ICD-10-CM | POA: Diagnosis not present

## 2022-11-21 DIAGNOSIS — I2121 ST elevation (STEMI) myocardial infarction involving left circumflex coronary artery: Secondary | ICD-10-CM | POA: Diagnosis not present

## 2022-11-21 DIAGNOSIS — I2119 ST elevation (STEMI) myocardial infarction involving other coronary artery of inferior wall: Principal | ICD-10-CM | POA: Diagnosis present

## 2022-11-21 DIAGNOSIS — Z86718 Personal history of other venous thrombosis and embolism: Secondary | ICD-10-CM | POA: Diagnosis not present

## 2022-11-21 DIAGNOSIS — I493 Ventricular premature depolarization: Secondary | ICD-10-CM | POA: Diagnosis not present

## 2022-11-21 DIAGNOSIS — Z90710 Acquired absence of both cervix and uterus: Secondary | ICD-10-CM

## 2022-11-21 DIAGNOSIS — F17210 Nicotine dependence, cigarettes, uncomplicated: Secondary | ICD-10-CM | POA: Diagnosis present

## 2022-11-21 DIAGNOSIS — Z803 Family history of malignant neoplasm of breast: Secondary | ICD-10-CM | POA: Diagnosis not present

## 2022-11-21 DIAGNOSIS — I213 ST elevation (STEMI) myocardial infarction of unspecified site: Principal | ICD-10-CM

## 2022-11-21 DIAGNOSIS — E785 Hyperlipidemia, unspecified: Secondary | ICD-10-CM | POA: Diagnosis present

## 2022-11-21 DIAGNOSIS — Z9079 Acquired absence of other genital organ(s): Secondary | ICD-10-CM | POA: Diagnosis not present

## 2022-11-21 HISTORY — PX: CORONARY/GRAFT ACUTE MI REVASCULARIZATION: CATH118305

## 2022-11-21 LAB — ECHOCARDIOGRAM COMPLETE
Area-P 1/2: 3.85 cm2
Height: 65 in
S' Lateral: 3.5 cm
Weight: 2783.79 [oz_av]

## 2022-11-21 LAB — POCT I-STAT 7, (LYTES, BLD GAS, ICA,H+H)
Acid-base deficit: 4 mmol/L — ABNORMAL HIGH (ref 0.0–2.0)
Bicarbonate: 20.6 mmol/L (ref 20.0–28.0)
Calcium, Ion: 1.22 mmol/L (ref 1.15–1.40)
HCT: 40 % (ref 36.0–46.0)
Hemoglobin: 13.6 g/dL (ref 12.0–15.0)
O2 Saturation: 91 %
Potassium: 3.4 mmol/L — ABNORMAL LOW (ref 3.5–5.1)
Sodium: 143 mmol/L (ref 135–145)
TCO2: 22 mmol/L (ref 22–32)
pCO2 arterial: 36.7 mmHg (ref 32–48)
pH, Arterial: 7.357 (ref 7.35–7.45)
pO2, Arterial: 64 mmHg — ABNORMAL LOW (ref 83–108)

## 2022-11-21 LAB — POCT I-STAT, CHEM 8
BUN: 12 mg/dL (ref 8–23)
Calcium, Ion: 1.21 mmol/L (ref 1.15–1.40)
Chloride: 109 mmol/L (ref 98–111)
Creatinine, Ser: 0.6 mg/dL (ref 0.44–1.00)
Glucose, Bld: 118 mg/dL — ABNORMAL HIGH (ref 70–99)
HCT: 40 % (ref 36.0–46.0)
Hemoglobin: 13.6 g/dL (ref 12.0–15.0)
Potassium: 3.4 mmol/L — ABNORMAL LOW (ref 3.5–5.1)
Sodium: 144 mmol/L (ref 135–145)
TCO2: 20 mmol/L — ABNORMAL LOW (ref 22–32)

## 2022-11-21 LAB — CG4 I-STAT (LACTIC ACID): Lactic Acid, Venous: 0.5 mmol/L (ref 0.5–1.9)

## 2022-11-21 LAB — POCT ACTIVATED CLOTTING TIME
Activated Clotting Time: 269 s
Activated Clotting Time: 299 s
Activated Clotting Time: 171 s

## 2022-11-21 LAB — T4, FREE: Free T4: 0.82 ng/dL (ref 0.61–1.12)

## 2022-11-21 LAB — I-STAT CG4 LACTIC ACID, ED: Lactic Acid, Venous: 1.4 mmol/L (ref 0.5–1.9)

## 2022-11-21 LAB — TSH: TSH: 1.796 u[IU]/mL (ref 0.350–4.500)

## 2022-11-21 LAB — BRAIN NATRIURETIC PEPTIDE: B Natriuretic Peptide: 48.4 pg/mL (ref 0.0–100.0)

## 2022-11-21 LAB — MRSA NEXT GEN BY PCR, NASAL: MRSA by PCR Next Gen: NOT DETECTED

## 2022-11-21 SURGERY — CORONARY/GRAFT ACUTE MI REVASCULARIZATION
Anesthesia: LOCAL

## 2022-11-21 MED ORDER — ATROPINE SULFATE 1 MG/10ML IJ SOSY
PREFILLED_SYRINGE | INTRAMUSCULAR | Status: AC
  Filled 2022-11-21: qty 10

## 2022-11-21 MED ORDER — ACETAMINOPHEN 325 MG PO TABS: 650.0000 mg | ORAL_TABLET | ORAL | Status: DC | PRN

## 2022-11-21 MED ORDER — AMLODIPINE BESYLATE 5 MG PO TABS: 10.0000 mg | ORAL_TABLET | Freq: Every day | ORAL | Status: DC

## 2022-11-21 MED ORDER — LIDOCAINE HCL (PF) 1 % IJ SOLN
INTRAMUSCULAR | Status: DC | PRN
  Administered 2022-11-21: 2 mL via INTRADERMAL
  Administered 2022-11-21: 15 mL via INTRADERMAL

## 2022-11-21 MED ORDER — LIDOCAINE HCL (PF) 1 % IJ SOLN
INTRAMUSCULAR | Status: AC
  Filled 2022-11-21: qty 30

## 2022-11-21 MED ORDER — HEPARIN SODIUM (PORCINE) 1000 UNIT/ML IJ SOLN
INTRAMUSCULAR | Status: AC
  Filled 2022-11-21: qty 10

## 2022-11-21 MED ORDER — SODIUM CHLORIDE 0.9 % IV SOLN: INTRAVENOUS | Status: DC

## 2022-11-21 MED ORDER — ASPIRIN 81 MG PO CHEW: 324.0000 mg | CHEWABLE_TABLET | ORAL | Status: DC

## 2022-11-21 MED ORDER — ASPIRIN 81 MG PO TBEC
81.0000 mg | DELAYED_RELEASE_TABLET | Freq: Every day | ORAL | Status: DC
  Administered 2022-11-22: 81 mg via ORAL
  Filled 2022-11-21: qty 1

## 2022-11-21 MED ORDER — SODIUM CHLORIDE 0.9 % IV SOLN: 250.0000 mL | INTRAVENOUS | Status: DC | PRN

## 2022-11-21 MED ORDER — NITROGLYCERIN 0.4 MG SL SUBL: 0.4000 mg | SUBLINGUAL_TABLET | SUBLINGUAL | Status: DC | PRN

## 2022-11-21 MED ORDER — ONDANSETRON HCL 4 MG/2ML IJ SOLN: 4.0000 mg | Freq: Four times a day (QID) | INTRAMUSCULAR | Status: DC | PRN

## 2022-11-21 MED ORDER — AMLODIPINE BESYLATE 10 MG PO TABS
10.0000 mg | ORAL_TABLET | Freq: Every day | ORAL | Status: DC
  Administered 2022-11-21 – 2022-11-22 (×2): 10 mg via ORAL
  Filled 2022-11-21 (×2): qty 1

## 2022-11-21 MED ORDER — HEPARIN (PORCINE) IN NACL 1000-0.9 UT/500ML-% IV SOLN
INTRAVENOUS | Status: DC | PRN
  Administered 2022-11-21 (×2): 500 mL

## 2022-11-21 MED ORDER — IOHEXOL 350 MG/ML SOLN
INTRAVENOUS | Status: DC | PRN
  Administered 2022-11-21: 155 mL

## 2022-11-21 MED ORDER — CANGRELOR TETRASODIUM 50 MG IV SOLR
INTRAVENOUS | Status: AC
  Filled 2022-11-21: qty 50

## 2022-11-21 MED ORDER — CANGRELOR BOLUS VIA INFUSION
INTRAVENOUS | Status: DC | PRN
  Administered 2022-11-21: 2367 ug via INTRAVENOUS

## 2022-11-21 MED ORDER — HEPARIN SODIUM (PORCINE) 5000 UNIT/ML IJ SOLN: 4000.0000 [IU] | Freq: Once | INTRAMUSCULAR | Status: DC

## 2022-11-21 MED ORDER — CLOPIDOGREL BISULFATE 300 MG PO TABS
ORAL_TABLET | ORAL | Status: DC | PRN
  Administered 2022-11-21: 600 mg via ORAL

## 2022-11-21 MED ORDER — NITROGLYCERIN 0.4 MG SL SUBL
SUBLINGUAL_TABLET | SUBLINGUAL | Status: AC
  Filled 2022-11-21: qty 1

## 2022-11-21 MED ORDER — POTASSIUM CHLORIDE CRYS ER 20 MEQ PO TBCR
40.0000 meq | EXTENDED_RELEASE_TABLET | Freq: Once | ORAL | Status: AC
  Administered 2022-11-21: 40 meq via ORAL
  Filled 2022-11-21: qty 2

## 2022-11-21 MED ORDER — ROSUVASTATIN CALCIUM 20 MG PO TABS
20.0000 mg | ORAL_TABLET | Freq: Every day | ORAL | Status: DC
  Administered 2022-11-22: 20 mg via ORAL
  Filled 2022-11-21: qty 1

## 2022-11-21 MED ORDER — SODIUM CHLORIDE 0.9 % IV BOLUS
INTRAVENOUS | Status: AC | PRN
  Administered 2022-11-21: 250 mL via INTRAVENOUS

## 2022-11-21 MED ORDER — SODIUM CHLORIDE 0.9 % WEIGHT BASED INFUSION
1.0000 mL/kg/h | INTRAVENOUS | Status: AC
  Administered 2022-11-21 (×2): 1 mL/kg/h via INTRAVENOUS

## 2022-11-21 MED ORDER — VERAPAMIL HCL 2.5 MG/ML IV SOLN
INTRAVENOUS | Status: AC
  Filled 2022-11-21: qty 2

## 2022-11-21 MED ORDER — MORPHINE SULFATE (PF) 4 MG/ML IV SOLN
4.0000 mg | Freq: Once | INTRAVENOUS | Status: AC
  Administered 2022-11-21: 4 mg via INTRAVENOUS

## 2022-11-21 MED ORDER — ASPIRIN 300 MG RE SUPP: 300.0000 mg | RECTAL | Status: DC

## 2022-11-21 MED ORDER — DULOXETINE HCL 60 MG PO CPEP
60.0000 mg | ORAL_CAPSULE | Freq: Every day | ORAL | Status: DC
  Administered 2022-11-22: 60 mg via ORAL
  Filled 2022-11-21: qty 1

## 2022-11-21 MED ORDER — VERAPAMIL HCL 2.5 MG/ML IV SOLN
INTRAVENOUS | Status: DC | PRN
  Administered 2022-11-21: 10 mL via INTRA_ARTERIAL

## 2022-11-21 MED ORDER — CARVEDILOL 6.25 MG PO TABS
6.2500 mg | ORAL_TABLET | Freq: Two times a day (BID) | ORAL | Status: DC
  Administered 2022-11-21 – 2022-11-22 (×3): 6.25 mg via ORAL
  Filled 2022-11-21 (×3): qty 1

## 2022-11-21 MED ORDER — ORAL CARE MOUTH RINSE: 15.0000 mL | OROMUCOSAL | Status: DC | PRN

## 2022-11-21 MED ORDER — LABETALOL HCL 5 MG/ML IV SOLN: 10.0000 mg | INTRAVENOUS | Status: AC | PRN

## 2022-11-21 MED ORDER — HEPARIN SODIUM (PORCINE) 1000 UNIT/ML IJ SOLN
INTRAMUSCULAR | Status: DC | PRN
  Administered 2022-11-21: 2000 [IU] via INTRAVENOUS
  Administered 2022-11-21: 5000 [IU] via INTRAVENOUS

## 2022-11-21 MED ORDER — SODIUM CHLORIDE 0.9% FLUSH: 3.0000 mL | INTRAVENOUS | Status: DC | PRN

## 2022-11-21 MED ORDER — ASPIRIN 81 MG PO CHEW: 324.0000 mg | CHEWABLE_TABLET | Freq: Once | ORAL | Status: DC

## 2022-11-21 MED ORDER — ASPIRIN 81 MG PO TBEC: 81.0000 mg | DELAYED_RELEASE_TABLET | Freq: Every day | ORAL | Status: DC

## 2022-11-21 MED ORDER — CLOPIDOGREL BISULFATE 300 MG PO TABS
ORAL_TABLET | ORAL | Status: AC
  Filled 2022-11-21: qty 2

## 2022-11-21 MED ORDER — CHLORHEXIDINE GLUCONATE CLOTH 2 % EX PADS
6.0000 | MEDICATED_PAD | Freq: Every day | CUTANEOUS | Status: DC
  Administered 2022-11-21 – 2022-11-22 (×2): 6 via TOPICAL

## 2022-11-21 MED ORDER — SODIUM CHLORIDE 0.9% FLUSH
3.0000 mL | Freq: Two times a day (BID) | INTRAVENOUS | Status: DC
  Administered 2022-11-22: 3 mL via INTRAVENOUS

## 2022-11-21 MED ORDER — SODIUM CHLORIDE 0.9 % IV SOLN
INTRAVENOUS | Status: DC | PRN
  Administered 2022-11-21: 4 ug/kg/min via INTRAVENOUS

## 2022-11-21 MED ORDER — CLOPIDOGREL BISULFATE 75 MG PO TABS
75.0000 mg | ORAL_TABLET | Freq: Every day | ORAL | Status: DC
  Administered 2022-11-22: 75 mg via ORAL
  Filled 2022-11-21: qty 1

## 2022-11-21 MED ORDER — HEPARIN SODIUM (PORCINE) 5000 UNIT/ML IJ SOLN
INTRAMUSCULAR | Status: AC
  Administered 2022-11-21: 4000 [IU]
  Filled 2022-11-21: qty 1

## 2022-11-21 MED ORDER — HYDRALAZINE HCL 20 MG/ML IJ SOLN
10.0000 mg | INTRAMUSCULAR | Status: AC | PRN
  Administered 2022-11-21 (×2): 10 mg via INTRAVENOUS
  Filled 2022-11-21 (×2): qty 1

## 2022-11-21 MED ORDER — MORPHINE SULFATE (PF) 4 MG/ML IV SOLN
INTRAVENOUS | Status: AC
  Filled 2022-11-21: qty 1

## 2022-11-21 MED ORDER — RIVAROXABAN 10 MG PO TABS
10.0000 mg | ORAL_TABLET | Freq: Every day | ORAL | Status: DC
  Administered 2022-11-22: 10 mg via ORAL
  Filled 2022-11-21: qty 1

## 2022-11-21 SURGICAL SUPPLY — 22 items
BALLN EMERGE MR 2.5X15 (BALLOONS) ×1
BALLOON EMERGE MR 2.5X15 (BALLOONS) IMPLANT
CATH INFINITI AMBI 5FR TG (CATHETERS) IMPLANT
CATH INFINITI JR4 5F (CATHETERS) IMPLANT
CATH LAUNCHER 6FR EBU3.5 (CATHETERS) IMPLANT
DEVICE RAD COMP TR BAND LRG (VASCULAR PRODUCTS) IMPLANT
GLIDESHEATH SLEND SS 6F .021 (SHEATH) IMPLANT
GUIDEWIRE ANGLED .035X150CM (WIRE) IMPLANT
GUIDEWIRE INQWIRE 1.5J.035X260 (WIRE) IMPLANT
INQWIRE 1.5J .035X260CM (WIRE) ×1
KIT ENCORE 26 ADVANTAGE (KITS) IMPLANT
PACK CARDIAC CATHETERIZATION (CUSTOM PROCEDURE TRAY) ×1 IMPLANT
PROTECTION STATION PRESSURIZED (MISCELLANEOUS) ×1
SET ATX-X65L (MISCELLANEOUS) IMPLANT
SHEATH PINNACLE 6F 10CM (SHEATH) IMPLANT
SHEATH PROBE COVER 6X72 (BAG) IMPLANT
STATION PROTECTION PRESSURIZED (MISCELLANEOUS) IMPLANT
STENT ONYX FRONTIER 2.5X26 (Permanent Stent) IMPLANT
TUBING CIL FLEX 10 FLL-RA (TUBING) IMPLANT
WIRE EMERALD 3MM-J .035X150CM (WIRE) IMPLANT
WIRE HI TORQ VERSACORE-J 145CM (WIRE) IMPLANT
WIRE RUNTHROUGH .014X180CM (WIRE) IMPLANT

## 2022-11-21 NOTE — ED Notes (Signed)
ED Provider at bedside. 

## 2022-11-21 NOTE — Progress Notes (Signed)
Phase 1 Cardiac Rehab [phone] Patient still on bed rest. MI/stent education complete with patient and patient's family including restrictions, CP, NTG use, and calling 911, risk factor modification, tobacco cessation, and activity progression. MI book, 1800QUITNOW, nutrition, and exercise handouts given. Discussed Phase 2 cardiac rehab, and patient is interested in the program at [facility], referral sent. Patient and family verbalizes understanding of instructions given.  Artist Pais, MS, ACSM CEP

## 2022-11-21 NOTE — ED Notes (Signed)
Vital signs stable. 

## 2022-11-21 NOTE — ED Provider Notes (Signed)
[name] Provider Note   CSN: [id number] Arrival date & time: 11/21/22  4132     History  Chief Complaint  Patient presents with   Code STEMI    [name] is a 62 y.o. female.  The history is provided by the patient and the EMS personnel.  [name] is a 62 y.o. female who presents to the Emergency Department complaining of STEMI.  She presents to the emergency department by EMS for evaluation of acute chest pain that started about 1 hour prior to arrival.  Pain is described as sharp and in her central chest.  Had associated feelings of cold and hot.  She did have nausea, diaphoresis.  No difficulty breathing, Donnell pain.  No recent illnesses.  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  Her last dose was a few days ago.  She also has a history of hypertension.  She does smoke.  No alcohol or drug use.  No prior similar symptoms.  EMS activated STEMI prior to ED arrival.  She was treated with 324 aspirin prior to ED arrival.  Nitrates were withheld due to lack of IV access.   She smokes tobacco.  No alcohol or drug use.  Home Medications Prior to Admission medications   Medication Sig Start Date End Date Taking? Authorizing Provider  amLODipine (NORVASC) 10 MG tablet Take 1 tablet (10 mg total) by mouth daily. 01/26/22   Medina-Vargas, Monina C, NP  DULoxetine (CYMBALTA) 30 MG capsule Take 2 capsules (60 mg total) by mouth daily. 03/30/22   Fanny Dance, MD  metroNIDAZOLE (FLAGYL) 500 MG tablet Take 1 tablet (500 mg total) by mouth 2 (two) times daily. 05/20/22   Ngetich, Donalee Citrin, NP  NARCAN 4 MG/0.1ML LIQD nasal spray kit  08/07/19   [provider]  pravastatin (PRAVACHOL) 80 MG tablet Take 1 tablet (80 mg total) by mouth daily. 01/26/22   Medina-Vargas, Monina C, NP  rivaroxaban (XARELTO) 10 MG TABS tablet Take 1 tablet (10 mg total) by mouth daily. 01/26/22   Medina-Vargas, Monina C, NP  tiZANidine  (ZANAFLEX) 2 MG tablet Take 1 tablet (2 mg total) by mouth every 8 (eight) hours as needed for muscle spasms. 03/30/22   Fanny Dance, MD  Vitamin D, Ergocalciferol, (DRISDOL) 1.25 MG (50000 UNIT) CAPS capsule Take 1 capsule (50,000 Units total) by mouth every 7 (seven) days. 01/26/22   Medina-Vargas, Monina C, NP      Allergies    Patient has no known allergies.    Review of Systems   Review of Systems  All other systems reviewed and are negative.   Physical Exam Updated Vital Signs BP (!) 142/96 (BP Location: Right Arm)   Pulse 69   Temp (!) 97.4 F (36.3 C) (Oral)   Resp (!) 25   Ht 5\' 5"  (1.651 m)   Wt 78.9 kg   LMP 06/13/2011   SpO2 100%   BMI 28.95 kg/m  Physical Exam Vitals and nursing note reviewed.  Constitutional:      General: She is in acute distress.     Appearance: She is well-developed. She is ill-appearing.  HENT:     Head: Normocephalic and atraumatic.  Cardiovascular:     Rate and Rhythm: Normal rate and regular rhythm.     Heart sounds: No murmur heard. Pulmonary:     Effort: Pulmonary effort is normal. No respiratory distress.     Breath sounds:  Normal breath sounds.  Abdominal:     Palpations: Abdomen is soft.     Tenderness: There is no abdominal tenderness. There is no guarding or rebound.  Musculoskeletal:        General: No tenderness.     Comments: 2+ radial and femoral pulses bilaterally.  Skin:    General: Skin is warm and dry.  Neurological:     Mental Status: She is alert and oriented to person, place, and time.  Psychiatric:        Behavior: Behavior normal.     ED Results / Procedures / Treatments   Labs (all labs ordered are listed, but only abnormal results are displayed) Labs Reviewed  CBC WITH DIFFERENTIAL/PLATELET  PROTIME-INR  APTT  COMPREHENSIVE METABOLIC PANEL  LIPID PANEL  I-STAT CG4 LACTIC ACID, ED  TROPONIN I (HIGH SENSITIVITY)    EKG None  Radiology No results found.  Procedures Procedures    CRITICAL CARE Performed by: Tilden Fossa   Total critical care time: 20 minutes  Critical care time was exclusive of separately billable procedures and treating other patients.  Critical care was necessary to treat or prevent imminent or life-threatening deterioration.  Critical care was time spent personally by me on the following activities: development of treatment plan with patient and/or surrogate as well as nursing, discussions with consultants, evaluation of patient's response to treatment, examination of patient, obtaining history from patient or surrogate, ordering and performing treatments and interventions, ordering and review of laboratory studies, ordering and review of radiographic studies, pulse oximetry and re-evaluation of patient's condition.  Medications Ordered in ED Medications  0.9 %  sodium chloride infusion (has no administration in time range)  aspirin chewable tablet 324 mg ( Oral MAR Hold 11/21/22 0629)  heparin injection 4,000 Units ( Intravenous Automatically Held 11/21/22 0630)  morphine (PF) 4 MG/ML injection (has no administration in time range)  nitroGLYCERIN (NITROSTAT) 0.4 MG SL tablet (  Given 11/21/22 0622)  heparin 5000 UNIT/ML injection (4,000 Units  Given 11/21/22 1610)  morphine (PF) 4 MG/ML injection 4 mg (4 mg Intravenous Given 11/21/22 9604)    ED Course/ Medical Decision Making/ A&P                                 Medical Decision Making Amount and/or Complexity of Data Reviewed Labs: ordered.  Risk OTC drugs. Prescription drug management. Decision regarding hospitalization.   Patient presented to the emergency department by EMS as a STEMI alert activated prior to ED arrival.  EKG prehospital is consistent with acute ST elevation MI.  Patient met by cardiology team at time of ED arrival.  IV access was established and she was treated with 4000 units of heparin, 1 sublingual nitroglycerin.  She had significant decrease in her blood  pressure after nitroglycerin administration to 140 systolic.  She was treated with additional morphine for pain control.  Plan to take emergently to the Cath Lab for further intervention.  Examination is not consistent with acute PE, dissection.        Final Clinical Impression(s) / ED Diagnoses Final diagnoses:  ST elevation myocardial infarction (STEMI), unspecified artery [facility])    Rx / DC Orders ED Discharge Orders     None         Tilden Fossa, MD 11/21/22 [phone]

## 2022-11-21 NOTE — Progress Notes (Signed)
Chest pain completely relieved. Has a few asymptomatic runs of AIVR in 80-90 bpm range. Echo shows hyperdynamic LV function despite inferolateral basal wall motion abnormality. There is 3+ MR with ischemic mechanism (posterior leaflet tethering), systolic dominant pulmonary vein flow. No bleeding issues at right radial or right groin access sites.

## 2022-11-21 NOTE — Plan of Care (Signed)
Problem: Education: Goal: Understanding of cardiac disease, CV risk reduction, and recovery process will improve Outcome: Progressing   Problem: Activity: Goal: Ability to tolerate increased activity will improve Outcome: Progressing   Problem: Health Behavior/Discharge Planning: Goal: Ability to safely manage health-related needs after discharge will improve Outcome: Progressing

## 2022-11-21 NOTE — H&P (Addendum)
Cardiology Admission History and Physical   Patient ID: [name] MRN: [id number]; DOB: Oct 22, 1960   Admission date: 11/21/2022  PCP:  Gillis Santa, NP   [facility] Providers Cardiologist:  None        Chief Complaint:  chest pain -> Inferior STEMI  Patient Profile:   [name] is a 62 y.o. female with tobacco abuse, HTN, prior DVT on xarelto who is being seen 11/21/2022 for the evaluation of inferior STEMI -> seen in the ER along with Dr. Hyacinth Meeker upon arrival.    History of Present Illness:   [name] is a 62 year old female with a history of hypertension, hyperlipidemia, prior DVT on Xarelto, tobacco abuse who arrives to the emergency department via EMS with chest pain and inferior ST elevation on ECG.  Notes that she started having chest pain earlier in the evening.  It was acute onset substernal chest pain radiating to her arms.  She called EMS and on their initial ECG noted to have inferior ST elevations.  He received aspirin 324 mg on route.  In the emergency department she was hypertensive but otherwise hemodynamically stable.  Brought to the Cath Lab emergently for coronary angiography and intervention.  Of note, she does take Xarelto for prior history of DVT.  She notes that she has not taken this medication in the last 2 days.  Upon arrival to the Cath Lab she was still having 8 out of 10 chest pain despite being given 1 sublingual nitroglycerin and 4 mg IV morphine.  Pain described as sharp pressure radiating up to the jaw   Past Medical History:  Diagnosis Date   DVT (deep venous thrombosis) (HCC)    Headache(784.0)    otc meds prn    History of hysterectomy 2014   Hyperlipidemia    Hypertension 09/2018   Plantar warts 08/2019   SVD (spontaneous vaginal delivery)    x 5   Vitamin D deficiency 09/2018    Past Surgical History:  Procedure Laterality Date   ABDOMINAL HYSTERECTOMY  04/06/2012   Procedure: HYSTERECTOMY ABDOMINAL;   Surgeon: [name], MD;  Location: [facility];  Service: Gynecology;  Laterality: N/A;   BREAST SURGERY     benign turmor removed   SALPINGOOPHORECTOMY  04/06/2012   Procedure: SALPINGO OOPHORECTOMY;  Surgeon: [name], MD;  Location: [facility];  Service: Gynecology;  Laterality: Bilateral;   TUBAL LIGATION       Medications Prior to Admission: Prior to Admission medications   Medication Sig Start Date End Date Taking? Authorizing Provider  amLODipine (NORVASC) 10 MG tablet Take 1 tablet (10 mg total) by mouth daily. 01/26/22   Medina-Vargas, Monina C, NP  DULoxetine (CYMBALTA) 30 MG capsule Take 2 capsules (60 mg total) by mouth daily. 03/30/22   Fanny Dance, MD  metroNIDAZOLE (FLAGYL) 500 MG tablet Take 1 tablet (500 mg total) by mouth 2 (two) times daily. 05/20/22   Ngetich, Donalee Citrin, NP  NARCAN 4 MG/0.1ML LIQD nasal spray kit  08/07/19   [provider]  pravastatin (PRAVACHOL) 80 MG tablet Take 1 tablet (80 mg total) by mouth daily. 01/26/22   Medina-Vargas, Monina C, NP  rivaroxaban (XARELTO) 10 MG TABS tablet Take 1 tablet (10 mg total) by mouth daily. 01/26/22   Medina-Vargas, Monina C, NP  tiZANidine (ZANAFLEX) 2 MG tablet Take 1 tablet (2 mg total) by mouth every 8 (eight) hours as needed for muscle spasms. 03/30/22   Fanny Dance, MD  Vitamin D, Ergocalciferol, (DRISDOL) 1.25 MG (50000 UNIT) CAPS capsule Take 1 capsule (50,000 Units total) by mouth every 7 (seven) days. 01/26/22   Medina-Vargas, Monina C, NP     Allergies:   No Known Allergies  Social History:   Social History   Socioeconomic History   Marital status: Married    Spouse name: Not on file   Number of children: Not on file   Years of education: Not on file   Highest education level: Not on file  Occupational History   Not on file  Tobacco Use   Smoking status: Every Day    Current packs/day: 0.25    Average packs/day: 0.3 packs/day for 38.0 years (9.5 ttl pk-yrs)    Types:  Cigarettes   Smokeless tobacco: Never   Tobacco comments:    3 per day.   Vaping Use   Vaping status: Never Used  Substance and Sexual Activity   Alcohol use: No    Comment:     Drug use: No   Sexual activity: Yes    Birth control/protection: Surgical  Other Topics Concern   Not on file  Social History Narrative   Not on file   Social Determinants of Health   Financial Resource Strain: Not on file  Food Insecurity: Not on file  Transportation Needs: Not on file  Physical Activity: Not on file  Stress: Not on file  Social Connections: Not on file  Intimate Partner Violence: Not on file    Family History:   The patient's family history includes Breast cancer in her sister; Diabetes in her sister; Heart failure in her mother; Hypertension in her mother.    ROS:  Please see the history of present illness.  All other ROS reviewed and negative.     Physical Exam/Data:   Vitals:   11/21/22 0615 11/21/22 0618 11/21/22 0623 11/21/22 0637  BP:   (!) 142/96   Pulse: (!) 117  69   Resp: 20  (!) 25   Temp:   (!) 97.4 F (36.3 C)   TempSrc:   Oral   SpO2: (!) 64%  100% 94%  Weight:  78.9 kg    Height:  5\' 5"  (1.651 m)     No intake or output data in the 24 hours ending 11/21/22 0644    11/21/2022    6:18 AM 06/17/2022    2:23 PM 05/20/2022    2:37 PM  Last 3 Weights  Weight (lbs) 173 lb 15.8 oz 175 lb 6.4 oz 178 lb 6 oz  Weight (kg) 78.92 kg 79.561 kg 80.91 kg     Body mass index is 28.95 kg/m.  General:  appears uncomfortable in pain; ; moderate distress.  Very difficult getting comfortable. HEENT: normal Neck: no JVD Vascular: No carotid bruits; Distal pulses 2+ bilaterally   Cardiac:  normal S1, S2; RRR; no murmur  Lungs:  clear to auscultation bilaterally, no wheezing, rhonchi or rales  Abd: soft, nontender, no hepatomegaly  Ext: no edema Musculoskeletal:  No deformities, BUE and BLE strength normal and equal Skin: warm and dry  Neuro:  CNs 2-12 intact, no  focal abnormalities noted Psych:  Normal affect    EKG:  The ECG that was done  was personally reviewed and demonstrates inferior STEMI  Relevant CV Studies: None  Laboratory Data:  High Sensitivity Troponin:  No results for input(s): "TROPONINIHS" in the last 720 hours.    ChemistryNo results for input(s): "NA", "K", "CL", "CO2", "GLUCOSE", "BUN", "CREATININE", "  CALCIUM", "MG", "GFRNONAA", "GFRAA", "ANIONGAP" in the last 168 hours.  No results for input(s): "PROT", "ALBUMIN", "AST", "ALT", "ALKPHOS", "BILITOT" in the last 168 hours. Lipids No results for input(s): "CHOL", "TRIG", "HDL", "LABVLDL", "LDLCALC", "CHOLHDL" in the last 168 hours. HematologyNo results for input(s): "WBC", "RBC", "HGB", "HCT", "MCV", "MCH", "MCHC", "RDW", "PLT" in the last 168 hours. Thyroid No results for input(s): "TSH", "FREET4" in the last 168 hours. BNPNo results for input(s): "BNP", "PROBNP" in the last 168 hours.  DDimer No results for input(s): "DDIMER" in the last 168 hours.   Radiology/Studies:  No results found.   Assessment and Plan:   STEMI, involving the inferior wall.  Acute onset chest pain in the evening with inferior STEMI.  Brought emergently to the Cath Lab with Dr. Herbie Baltimore.  Loaded with aspirin 324 mg and heparin 4000 units prior to arrival. - Echo ordered - heparin for anticoagulation-will need to transition to Xarelto after 24 hours - loaded 325 mg ASA; continue 81 mg daily  -P2 Y12 pending load in the Cath Lab -with her being on DOAC, we will use Plavix preferentially - start high intensity statin with rosuvastatin 20 mg; check lipid panel and LP(a) - will start on carvedilol 6.25 mg for beta blocker therapy - plan to start ACEi/ARB post cath - will check CBC, CMP, INR, hemoglobin A1c, tsh/FT4 - referral for cardiac rehab - admit for cardiac tele; strict I&Os; daily weights  - sublingual NTG PRN for pain - can escalate to nitro gtt if needed   2.  Hypertension She was  markedly hypertensive in the acute setting.  Is on amlodipine at home otherwise. -Continue amlodipine -Start carvedilol as above  3.  Hyperlipidemia Secondary prevention her LDL should be less than 70.  Transitioning her to a high intensity statin. -Discontinue pravastatin, start rosuvastatin 20 mg  4.  History of DVT on anticoagulation Holding Xarelto in the setting of acute procedure.  Will need to reinitiate anticoagulation 6 hours post procedure at minimum. -Hold Xarelto, can restart 24 hours after.    Risk Assessment/Risk Scores:    TIMI Risk Score for ST  Elevation MI:   The patient's TIMI risk score is 1, which indicates a 1.6% risk of all cause mortality at 30 days.       Code Status: Full Code  Severity of Illness: The appropriate patient status for this patient is INPATIENT. Inpatient status is judged to be reasonable and necessary in order to provide the required intensity of service to ensure the patient's safety. The patient's presenting symptoms, physical exam findings, and initial radiographic and laboratory data in the context of their chronic comorbidities is felt to place them at high risk for further clinical deterioration. Furthermore, it is not anticipated that the patient will be medically stable for discharge from the hospital within 2 midnights of admission.   * I certify that at the point of admission it is my clinical judgment that the patient will require inpatient hospital care spanning beyond 2 midnights from the point of admission due to high intensity of service, high risk for further deterioration and high frequency of surveillance required.*   For questions or updates, please contact [facility] Please consult [email or web address] for contact info under     Signed, Joellen Jersey, MD  11/21/2022 6:44 AM    ATTENDING ATTESTATION  I have seen, examined and evaluated the patient this morning in the ER along with Dr. Nurse these.  After  reviewing all the available  data and chart, we discussed the patients laboratory, study & physical findings as well as symptoms in detail.  I agree with his findings, examination as well as impression recommendations as per our discussion.    Attending adjustments noted in italics. .  Acute inferior-inferolateral STEMI with 8 of 10 pain despite morphine and nitroglycerin.  Borderline hypertensive on arrival but after nitroglycerin dropped into the 140s.  Plan is emergent catheterization.  Agree with rest of the plan.    Marykay Lex, MD, MS Bryan Lemma, M.D., M.S. Interventional Cardiologist  [facility]  Pager # [phone] Phone # [phone] [address] [place], [place] [address]

## 2022-11-21 NOTE — Progress Notes (Signed)
1000 Dr. Royann Shivers rounding on patient for Cardiology.  Aware of runs of vtach.  OK to give carvedilol now.  OK to remove femoral sheath once ACT <175 per protocol.  1445 Right femoral sheath removed after palpating proximal insertion site.  10 cc blood aspirated before removal.  Firm pressure held for 25 minutes after removal.  Hemostasis achieved.  No palpation of hematoma.  No signs/symptoms of retroperitoneal bleed.   Sheath removed with charge RN Courtenay.

## 2022-11-21 NOTE — ED Triage Notes (Signed)
Patient was at home when she had a sudden onset of center chest pain 30 mins prior to arrival, patient report the chest pain to feel sharp. Patient reports HX of blood clots in legs taking an blood thinner to TX. Patient A&O at this time, EMS reports BP 178/90  HR 64   O2 91, 3- 24 of Asprin given no nitroglycerin with EMS.

## 2022-11-22 ENCOUNTER — Telehealth: Payer: Self-pay | Admitting: Student

## 2022-11-22 DIAGNOSIS — I219 Acute myocardial infarction, unspecified: Secondary | ICD-10-CM

## 2022-11-22 DIAGNOSIS — I2119 ST elevation (STEMI) myocardial infarction involving other coronary artery of inferior wall: Secondary | ICD-10-CM | POA: Diagnosis not present

## 2022-11-22 DIAGNOSIS — E785 Hyperlipidemia, unspecified: Secondary | ICD-10-CM | POA: Insufficient documentation

## 2022-11-22 HISTORY — DX: Acute myocardial infarction, unspecified: I21.9

## 2022-11-22 LAB — BASIC METABOLIC PANEL
Anion gap: 8 (ref 5–15)
BUN: 11 mg/dL (ref 8–23)
CO2: 21 mmol/L — ABNORMAL LOW (ref 22–32)
Calcium: 8.4 mg/dL — ABNORMAL LOW (ref 8.9–10.3)
Chloride: 112 mmol/L — ABNORMAL HIGH (ref 98–111)
Creatinine, Ser: 0.79 mg/dL (ref 0.44–1.00)
GFR, Estimated: >60 mL/min (ref >60)
Glucose, Bld: 96 mg/dL (ref 70–99)
Potassium: 3.4 mmol/L — ABNORMAL LOW (ref 3.5–5.1)
Sodium: 141 mmol/L (ref 135–145)

## 2022-11-22 LAB — CBC
HCT: 36.4 % (ref 36.0–46.0)
Hemoglobin: 11.8 g/dL — ABNORMAL LOW (ref 12.0–15.0)
MCH: 30.3 pg (ref 26.0–34.0)
MCHC: 32.4 g/dL (ref 30.0–36.0)
MCV: 93.3 fL (ref 80.0–100.0)
Platelets: 208 10*3/uL (ref 150–400)
RBC: 3.9 MIL/uL (ref 3.87–5.11)
RDW: 13.9 % (ref 11.5–15.5)
WBC: 5.8 10*3/uL (ref 4.0–10.5)
nRBC: 0 % (ref 0.0–0.2)

## 2022-11-22 LAB — PROTIME-INR
INR: 1.1 (ref 0.8–1.2)
Prothrombin Time: 14.8 s (ref 11.4–15.2)

## 2022-11-22 LAB — LIPID PANEL
Cholesterol: 149 mg/dL (ref 0–200)
HDL: 24 mg/dL — ABNORMAL LOW (ref >40)
LDL Cholesterol: 112 mg/dL — ABNORMAL HIGH (ref 0–99)
Total CHOL/HDL Ratio: 6.2 ratio
Triglycerides: 63 mg/dL (ref <150)
VLDL: 13 mg/dL (ref 0–40)

## 2022-11-22 MED ORDER — NITROGLYCERIN 0.4 MG SL SUBL: 0.4000 mg | SUBLINGUAL_TABLET | SUBLINGUAL | 2 refills | Status: DC | PRN

## 2022-11-22 MED ORDER — CLOPIDOGREL BISULFATE 75 MG PO TABS: 75.0000 mg | ORAL_TABLET | Freq: Every day | ORAL | 3 refills | Status: DC

## 2022-11-22 MED ORDER — CARVEDILOL 6.25 MG PO TABS: 6.2500 mg | ORAL_TABLET | Freq: Two times a day (BID) | ORAL | 1 refills | Status: DC

## 2022-11-22 MED ORDER — ASPIRIN 81 MG PO TBEC: 81.0000 mg | DELAYED_RELEASE_TABLET | Freq: Every day | ORAL | 3 refills | Status: DC

## 2022-11-22 MED ORDER — ROSUVASTATIN CALCIUM 20 MG PO TABS: 20.0000 mg | ORAL_TABLET | Freq: Every day | ORAL | 1 refills | Status: DC

## 2022-11-22 MED ORDER — POTASSIUM CHLORIDE CRYS ER 20 MEQ PO TBCR: 40.0000 meq | EXTENDED_RELEASE_TABLET | Freq: Once | ORAL | Status: DC

## 2022-11-22 NOTE — Telephone Encounter (Signed)
Transition of Care Follow-up Phone Call Request    Patient Name: [name] Date of Birth: 04/09/1960 Date of Encounter: 11/22/2022  Primary Care Provider:  Gillis Santa, NP Primary Cardiologist:  None  [name] has been scheduled for a transition of care follow up appointment with a [facility] provider:  12/01/2022 with Bernadene Person, NP  Please reach out to [name] within 48 hours to confirm appointment and review transition of care protocol questionnaire.  Ellsworth Lennox, PA-C  11/22/2022, 11:36 AM

## 2022-11-22 NOTE — Discharge Summary (Signed)
Discharge Summary    Patient ID: [name] MRN: [id number]; DOB: [birth year]  Admit date: 11/21/2022 Discharge date: 11/22/2022  PCP:  Gillis Santa, NP   [facility] Providers Cardiologist:  Bryan Lemma, MD        Discharge Diagnoses    Principal Problem:   Acute ST elevation myocardial infarction (STEMI) involving other coronary artery of inferior wall [facility]) Active Problems:   Essential hypertension   History of DVT (deep vein thrombosis)   Hyperlipidemia   Diagnostic Studies/Procedures    Cardiac Catheterization: 11/21/2022      Dist Cx lesion is 100% stenosed with 100% stenosed side branch in 4th Mrg.   A drug-eluting stent was successfully placed from dLCx into 4th Mrg using a STENT ONYX FRONTIER 2.5X26 -> deployed to 2.75 mm.  Post intervention, there is a 0% residual stenosis from LCx into 4thMrg(LPL1).   --------------------   Prox RCA to Mid RCA lesion is 40% stenosed.   --------------------   In the absence of any other complications or medical issues, we expect the patient to be ready for discharge from an interventional cardiology perspective on 11/22/2022.   Recommend to resume Rivaroxaban, at currently prescribed dose and frequency on 11/22/2022.   Recommend concurrent antiplatelet therapy of Aspirin 81 mg for 1 month and Clopidogrel 75mg  daily for 12 months .   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 Normal LVEF (55 to 65%) and moderately elevated LVEDP of 24 mmHg.      RECOMMENDATIONS Temporary ICU admission for monitoring; check 2D Echo and cycle troponins   In the absence of any other complications or medical issues, we expect the patient to be ready for discharge from an  interventional cardiology perspective on 11/22/2022. Restart home medications including amlodipine and carvedilol   Recommend to resume Rivaroxaban, at currently prescribed dose and frequency on 11/22/2022.   Recommend concurrent antiplatelet therapy of Aspirin 81 mg for 1 month and Clopidogrel 75mg  daily for 12 months.  Echocardiogram: 11/21/2022 IMPRESSIONS     1. Left ventricular ejection fraction, by estimation, is 65 to 70%. The  left ventricle has hyperdynamic function. The left ventricle demonstrates  regional wall motion abnormalities (see scoring diagram/findings for  description). Left ventricular  diastolic parameters are indeterminate. There is moderate hypokinesis of  the left ventricular, basal inferior wall and inferolateral wall.   2. Right ventricular systolic function is normal. The right ventricular  size is normal. There is mildly elevated pulmonary artery systolic  pressure. The estimated right ventricular systolic pressure is 33.8 mmHg.   3. The mitral valve is normal in structure. Moderate mitral valve  regurgitation. No evidence of mitral stenosis.   4. The aortic valve is tricuspid. Aortic valve regurgitation is not  visualized. No aortic stenosis is present.   5. The inferior vena cava is dilated in size with >50% respiratory  variability, suggesting right atrial pressure of 8 mmHg.    History of Present Illness     [name] is a 62 y.o. female with past medical history of DVT, HTN, HLD and tobacco use who presented to [facility] ED during the early morning hours of 11/21/2022 as a Code STEMI.  She reported being in her normal state of health until she developed acute  onset of substernal chest pain that evening which radiated into her arms.  She did call EMS initial EKG showed ST elevation along the inferior leads.  Code STEMI was activated and she underwent emergent cardiac catheterization.  While she had been on Xarelto for DVT, she reported not taking  the medication for the past 2 days.   Hospital Course     Consultants: None   Upon arrival to the catheterization lab, she was still having 8 out of 10 chest pain despite receiving nitroglycerin and morphine. Cardiac catheterization was performed by Dr. Herbie Baltimore and showed severe single-vessel disease with 100% thrombotic/ulcerated occlusion of the mid-LCx which was treated with DES placement. She otherwise had minimal disease along the mid RCA. She was started on ASA and Plavix as Plavix was preferred over Brilinta given that she is on Xarelto for anticoagulation. Was recommended to be on ASA and Plavix for 1 month and then stop ASA given the use of Xarelto.  On 11/22/2022, she was examined by Dr. Royann Shivers and denied any recurrent chest pain or dyspnea. Was deemed stable for discharge. She ambulated over 370 feet without anginal symptoms. She did continue to have intermittent episodes of AIVR but was asymptomatic with these. Echocardiogram showed a preserved EF of 65 to 70% but she did have moderate hypokinesis of the left ventricular, basal inferior wall and inferior lateral wall. Was also noted to have moderate MR. It was felt this was likely due to an ischemic mechanism and could reassess as an outpatient.  She was discharged on ASA, Plavix and Xarelto with plans to stop ASA after 30 days if P2Y12 showed aggregate reduction in platelet aggregation. She was also started on Coreg 6.25 mg twice daily and Crestor 20 mg daily (LDL 112) during admission. A TOV visit has been arranged within the next 2 weeks.    Did the patient have an acute coronary syndrome (MI, NSTEMI, STEMI, etc) this admission?:  Yes                               AHA/ACC Clinical Performance & Quality Measures: Aspirin prescribed? - Yes ADP Receptor Inhibitor (Plavix/Clopidogrel, Brilinta/Ticagrelor or Effient/Prasugrel) prescribed (includes medically managed patients)? - Yes Beta Blocker prescribed? - Yes High Intensity Statin  (Lipitor 40-80mg  or Crestor 20-40mg ) prescribed? - Yes EF assessed during THIS hospitalization? - Yes For EF <40%, was ACEI/ARB prescribed? - Not Applicable (EF >/= 40%) For EF <40%, Aldosterone Antagonist (Spironolactone or Eplerenone) prescribed? - Not Applicable (EF >/= 40%) Cardiac Rehab Phase II ordered (including medically managed patients)? - Yes      The patient will be scheduled for a TOC follow up appointment on 12/01/2022.  A message has been sent to the [facility] and Scheduling Pool at the office where the patient should be seen for follow up.  _____________  Discharge Vitals Blood pressure 131/71, pulse 66, temperature 97.8 F (36.6 C), temperature source Oral, resp. rate (!) 21, height 5\' 5"  (1.651 m), weight 78.9 kg, last menstrual period 06/13/2011, SpO2 93%.  Filed Weights   11/21/22 0618  Weight: 78.9 kg    Labs & Radiologic Studies    CBC Recent Labs    11/21/22 0646 11/22/22 0507  WBC  --  5.8  HGB 13.6  13.6 11.8*  HCT 40.0  40.0 36.4  MCV  --  93.3  PLT  --  208   Basic Metabolic Panel Recent Labs  11/21/22 0646 11/22/22 0507  NA 143  144 141  K 3.4*  3.4* 3.4*  CL 109 112*  CO2  --  21*  GLUCOSE 118* 96  BUN 12 11  CREATININE 0.60 0.79  CALCIUM  --  8.4*   Liver Function Tests No results for input(s): "AST", "ALT", "ALKPHOS", "BILITOT", "PROT", "ALBUMIN" in the last 72 hours. No results for input(s): "LIPASE", "AMYLASE" in the last 72 hours. High Sensitivity Troponin:   No results for input(s): "TROPONINIHS" in the last 720 hours.  BNP Invalid input(s): "POCBNP" D-Dimer No results for input(s): "DDIMER" in the last 72 hours. Hemoglobin A1C No results for input(s): "HGBA1C" in the last 72 hours. Fasting Lipid Panel Recent Labs    11/22/22 0507  CHOL 149  HDL 24*  LDLCALC 112*  TRIG 63  CHOLHDL 6.2   Thyroid Function Tests Recent Labs    11/21/22 0634  TSH 1.796   _____________   Disposition   Pt is being discharged  home today in good condition.  Follow-up Plans & Appointments     Follow-up Information     Joylene Grapes, NP Follow up on 12/01/2022.   Specialties: Cardiology, Family Medicine Why: Cardiology Hospital Follow-up on 12/01/2022 at 10:55 AM. Contact information: [address] [place] [address] [phone]                Discharge Instructions     Amb Referral to Cardiac Rehabilitation   Complete by: As directed    Diagnosis:  Coronary Stents STEMI     After initial evaluation and assessments completed: Virtual Based Care may be provided alone or in conjunction with Phase 2 Cardiac Rehab based on patient barriers.: Yes   Intensive Cardiac Rehabilitation (ICR) [facility] location only OR Traditional Cardiac Rehabilitation (TCR) *If criteria for ICR are not met will enroll in TCR [facility] only): Yes   Diet - low sodium heart healthy   Complete by: As directed    Discharge instructions   Complete by: As directed    PLEASE REMEMBER TO BRING ALL OF YOUR MEDICATIONS TO EACH OF YOUR FOLLOW-UP OFFICE VISITS.  PLEASE ATTEND ALL SCHEDULED FOLLOW-UP APPOINTMENTS.   Activity: Increase activity slowly as tolerated. You may shower, but no soaking baths (or swimming) for 1 week. You may not return to work until cleared by your cardiologist. No lifting over 10 lbs for 4 weeks. No sexual activity for 4 weeks.   Wound Care: You may wash cath site gently with soap and water. Keep cath site clean and dry. If you notice pain, swelling, bleeding or pus at your cath site, please call [phone].   Increase activity slowly   Complete by: As directed         Discharge Medications   Allergies as of 11/22/2022   No Known Allergies      Medication List     STOP taking these medications    metroNIDAZOLE 500 MG tablet Commonly known as: FLAGYL   pravastatin 80 MG tablet Commonly known as: PRAVACHOL       TAKE these medications    amLODipine 10 MG tablet Commonly known  as: NORVASC Take 1 tablet (10 mg total) by mouth daily.   aspirin EC 81 MG tablet Take 1 tablet (81 mg total) by mouth daily. Swallow whole. Start taking on: November 23, 2022 Notes to patient: Take for 30 days    carvedilol 6.25 MG tablet Commonly known as: COREG Take 1 tablet (6.25 mg total) by  mouth 2 (two) times daily with a meal.   clopidogrel 75 MG tablet Commonly known as: PLAVIX Take 1 tablet (75 mg total) by mouth daily with breakfast. Start taking on: November 23, 2022   DULoxetine 30 MG capsule Commonly known as: CYMBALTA Take 2 capsules (60 mg total) by mouth daily.   Narcan 4 MG/0.1ML Liqd nasal spray kit Generic drug: naloxone   nitroGLYCERIN 0.4 MG SL tablet Commonly known as: NITROSTAT Place 1 tablet (0.4 mg total) under the tongue every 5 (five) minutes x 3 doses as needed for chest pain.   rosuvastatin 20 MG tablet Commonly known as: CRESTOR Take 1 tablet (20 mg total) by mouth daily. Start taking on: November 23, 2022 What changed:  medication strength how much to take   tiZANidine 2 MG tablet Commonly known as: ZANAFLEX Take 1 tablet (2 mg total) by mouth every 8 (eight) hours as needed for muscle spasms.   Vitamin D (Ergocalciferol) 1.25 MG (50000 UNIT) Caps capsule Commonly known as: DRISDOL Take 1 capsule (50,000 Units total) by mouth every 7 (seven) days.   Xarelto 10 MG Tabs tablet Generic drug: rivaroxaban Take 1 tablet (10 mg total) by mouth daily.           Outstanding Labs/Studies   Follow-up on P2Y12 lab; FLP and LFT's in 6-8 weeks.   Duration of Discharge Encounter   Greater than 30 minutes including physician time.  Signed, Ellsworth Lennox, PA-C 11/22/2022, 11:49 AM

## 2022-11-22 NOTE — Progress Notes (Signed)
Pt ambulated unit (370 ft) without cp and/or sob.

## 2022-11-22 NOTE — Discharge Instructions (Signed)

## 2022-11-22 NOTE — Progress Notes (Signed)
   Patient Name: [name] Date of Encounter: 11/22/2022 [facility] Cardiologist: None Herbie Baltimore)  Interval Summary  .    No problems with angina or dyspnea.  Continues to have AIVR until earlier this morning, 24 hours since her revascularization procedure (asymptomatic).  Vital Signs .    Vitals:   11/22/22 0400 11/22/22 0500 11/22/22 0746 11/22/22 0815  BP: 112/66 112/62  131/71  Pulse: 64 61  70  Resp:      Temp: 99.1 F (37.3 C)  98.8 F (37.1 C)   TempSrc:   Oral   SpO2: 96% 95%    Weight:      Height:        Intake/Output Summary (Last 24 hours) at 11/22/2022 0905 Last data filed at 11/22/2022 0530 Gross per 24 hour  Intake 1018.78 ml  Output 2150 ml  Net -1131.22 ml      11/21/2022    6:18 AM 06/17/2022    2:23 PM 05/20/2022    2:37 PM  Last 3 Weights  Weight (lbs) 173 lb 15.8 oz 175 lb 6.4 oz 178 lb 6 oz  Weight (kg) 78.92 kg 79.561 kg 80.91 kg      Telemetry/ECG    Sinus rhythm, frequent PVCs, occasional brief nonsustained VT AIVR- Personally Reviewed  Physical Exam .   GEN: No acute distress.   Neck: No JVD Cardiac: RRR, no murmurs, rubs, or gallops.  Respiratory: Clear to auscultation bilaterally. GI: Soft, nontender, non-distended  MS: No edema  Assessment & Plan .     62 year old woman presenting with inferior STEMI, treated with emergency PCI-DES, preserved left ventricular systolic function with moderate MR with ischemic mechanism due to inferolateral hypokinesis, on a background of smoking, hypertension, hyperlipidemia and history of previous lower extremity DVT on Xarelto.  Today she does not have any audible apical systolic murmurs.  Hopefully this means that myocardial stunning is already improving and that the MR has diminished.  No clinical evidence of heart failure.  Continues to have ventricular ectopy, but the burden appears to be gradually decreasing and has mostly had slow AIVR, not true VT. On carvedilol.  Discharge  later today if she remains asymptomatic after walking in the hallway.  Plan uninterrupted antiplatelet therapy for minimum of 12 months (using Plavix since plan to resume full anticoagulation for DVT; stop aspirin after 30 days if P2Y12 study shows adequate reduction in platelet aggregation), beta-blocker, high dose statin.  Strongly recommend complete smoking cessation (she is currently smoking about a pack per week).  Cardiac rehab.  For questions or updates, please contact [facility] Please consult [email or web address] for contact info under        Signed, Thurmon Fair, MD

## 2022-11-22 NOTE — Plan of Care (Signed)
progressing 

## 2022-11-23 ENCOUNTER — Encounter (HOSPITAL_COMMUNITY): Payer: Self-pay | Admitting: Cardiology

## 2022-11-23 LAB — HEMOGLOBIN A1C
Hgb A1c MFr Bld: 5.7 % — ABNORMAL HIGH (ref 4.8–5.6)
Mean Plasma Glucose: 117 mg/dL

## 2022-11-23 NOTE — Telephone Encounter (Signed)
Patient contacted regarding discharge from [place] on 11/22/22  Patient understands to follow up with provider E. Monge on 12/01/22 at 10:55 at [facility] office. Address and time given twice as she is not writing it down.  Patient understands discharge instructions? Yes Patient understands medications and regiment? Yes Patient understands to bring all medications to this visit? Yes  Ask patient:  Are you enrolled in My Chart. She states daughter will set up. Text sent to her My Chart  l.              Do you have any questions about your medications?  All medications (except pain medications) are to be filled by your Cardiologist AFTER your first post op appointment with them.  Are you taking your pain medication? None              How is your pain controlled? Pain level? No pain              If you require a refill on pain medications, know that the same medication/ amount may not be prescribed or a refill may not be given.  Please contact your pharmacy for refill requests.               Do you have help at home with ADL's?  If you have home health, have you been contacted or seen by the agency? None needed

## 2022-11-24 ENCOUNTER — Telehealth (HOSPITAL_COMMUNITY): Payer: Self-pay

## 2022-11-24 LAB — LIPOPROTEIN A (LPA): Lipoprotein (a): 201.5 nmol/L — ABNORMAL HIGH (ref <75.0)

## 2022-11-24 NOTE — Telephone Encounter (Signed)
Pt insurance is active and benefits verified through [facility]. Co-pay $0.00, DED $240.00/$0.00 met, out of pocket $3,600.00/$0.00 met, co-insurance 20%. No pre-authorization required. Passport, 11/24/22 @ 4:03PM, REF#[id number]   How many CR sessions are covered? (36 visits for TCR, 72 visits for ICR)72 Is this a lifetime maximum or an annual maximum? Lifetime Has the member used any of these services to date? No Is there a time limit (weeks/months) on start of program and/or program completion? No   2ndary insurance is active and benefits verified through Medicaid. Co-pay $0.00, DED $0.00/$0.00 met, out of pocket $0.00/$0.00 met, co-insurance 0%. No pre-authorization required. Passport, 11/24/22 @ 4:09PM, REF#[id number]      Will contact patient to see if she is interested in the Cardiac Rehab Program. If interested, patient will need to complete follow up appt. Once completed, patient will be contacted for scheduling upon review by the RN Navigator.

## 2022-11-24 NOTE — Telephone Encounter (Signed)
Called patient to see if she is interested in the Cardiac Rehab Program. Patient expressed interest. Explained scheduling process and went over insurance, patient verbalized understanding. Will contact patient for scheduling once f/u has been completed.

## 2022-11-26 LAB — PLATELET INHIBITION P2Y12

## 2022-11-27 ENCOUNTER — Telehealth: Payer: Self-pay | Admitting: Emergency Medicine

## 2022-11-27 ENCOUNTER — Encounter: Payer: Self-pay | Admitting: Emergency Medicine

## 2022-11-27 NOTE — Telephone Encounter (Signed)
Croitoru, Rachelle Hora, MD  Scheryl Marten, RN Cc: Gillis Santa, NP; Marykay Lex, MD Good test result, shows that the clopidogrel is working as expected.  Plan to stop the aspirin in 30 days after the stent, then continue clopidogrel 12 months and Xarelto indefinitely.  Informed the pt of results and instructions from Dr Royann Shivers. She verbalized understanding. Will mail a copy of these to the patient. Address updated.

## 2022-12-01 ENCOUNTER — Telehealth: Payer: Self-pay

## 2022-12-01 ENCOUNTER — Ambulatory Visit: Payer: Medicare HMO | Attending: Nurse Practitioner | Admitting: Nurse Practitioner

## 2022-12-01 ENCOUNTER — Encounter: Payer: Self-pay | Admitting: Nurse Practitioner

## 2022-12-01 VITALS — BP 126/80 | HR 67 | Ht 66.0 in | Wt 181.0 lb

## 2022-12-01 DIAGNOSIS — I10 Essential (primary) hypertension: Secondary | ICD-10-CM

## 2022-12-01 DIAGNOSIS — E785 Hyperlipidemia, unspecified: Secondary | ICD-10-CM | POA: Diagnosis not present

## 2022-12-01 DIAGNOSIS — Z720 Tobacco use: Secondary | ICD-10-CM

## 2022-12-01 DIAGNOSIS — I340 Nonrheumatic mitral (valve) insufficiency: Secondary | ICD-10-CM

## 2022-12-01 DIAGNOSIS — I2510 Atherosclerotic heart disease of native coronary artery without angina pectoris: Secondary | ICD-10-CM | POA: Diagnosis not present

## 2022-12-01 DIAGNOSIS — Z86718 Personal history of other venous thrombosis and embolism: Secondary | ICD-10-CM | POA: Diagnosis not present

## 2022-12-01 DIAGNOSIS — I493 Ventricular premature depolarization: Secondary | ICD-10-CM

## 2022-12-01 DIAGNOSIS — I519 Heart disease, unspecified: Secondary | ICD-10-CM

## 2022-12-01 MED ORDER — ASPIRIN 81 MG PO TBEC: 81.0000 mg | DELAYED_RELEASE_TABLET | Freq: Every day | ORAL | 3 refills | Status: DC

## 2022-12-01 MED ORDER — CLOPIDOGREL BISULFATE 75 MG PO TABS: 75.0000 mg | ORAL_TABLET | Freq: Every day | ORAL | 3 refills | Status: AC

## 2022-12-01 MED ORDER — CARVEDILOL 6.25 MG PO TABS: 6.2500 mg | ORAL_TABLET | Freq: Two times a day (BID) | ORAL | 3 refills | Status: DC

## 2022-12-01 MED ORDER — ROSUVASTATIN CALCIUM 20 MG PO TABS: 20.0000 mg | ORAL_TABLET | Freq: Every day | ORAL | 3 refills | Status: DC

## 2022-12-01 NOTE — Patient Instructions (Signed)
Medication Instructions:  No Changes *If you need a refill on your cardiac medications before your next appointment, please call your pharmacy*   Lab Work: Fasting LFT and Lipids 1 month  If you have labs (blood work) drawn today and your tests are completely normal, you will receive your results only by: MyChart Message (if you have MyChart) OR A paper copy in the mail If you have any lab test that is abnormal or we need to change your treatment, we will call you to review the results.   Testing/Procedures:  Echocardiogram in three months  Your physician has requested that you have an echocardiogram. Echocardiography is a painless test that uses sound waves to create images of your heart. It provides your doctor with information about the size and shape of your heart and how well your heart's chambers and valves are working. This procedure takes approximately one hour. There are no restrictions for this procedure. Please do NOT wear cologne, perfume, aftershave, or lotions (deodorant is allowed). Please arrive 15 minutes prior to your appointment time.    Follow-Up: At [facility], you and your health needs are our priority.  As part of our continuing mission to provide you with exceptional heart care, we have created designated Provider Care Teams.  These Care Teams include your primary Cardiologist (physician) and Advanced Practice Providers (APPs -  Physician Assistants and Nurse Practitioners) who all work together to provide you with the care you need, when you need it.  We recommend signing up for the patient portal called "MyChart".  Sign up information is provided on this After Visit Summary.  MyChart is used to connect with patients for Virtual Visits (Telemedicine).  Patients are able to view lab/test results, encounter notes, upcoming appointments, etc.  Non-urgent messages can be sent to your provider as well.   To learn more about what you can do with MyChart, go to  [email or web address].    Your next appointment:   After Echo  Provider:   Bernadene Person, NP

## 2022-12-01 NOTE — Telephone Encounter (Signed)
Pt was seen in office today by Bernadene Person NP. Recent lab work from 11/30/22 was requested from [facility] in [place]. Waiting to receive copy of labs.

## 2022-12-11 ENCOUNTER — Telehealth: Payer: Self-pay | Admitting: *Deleted

## 2022-12-11 NOTE — Telephone Encounter (Signed)
Faxed  cardiac rehab signed ordered

## 2022-12-15 ENCOUNTER — Telehealth (HOSPITAL_COMMUNITY): Payer: Self-pay

## 2022-12-15 NOTE — Telephone Encounter (Signed)
Called patient to see if she was interested in participating in the Cardiac Rehab Program. Patient stated yes. Patient will come in for orientation on 12/17/22 1:15PM and will attend the 1:45PM exercise class.

## 2022-12-16 ENCOUNTER — Telehealth (HOSPITAL_COMMUNITY): Payer: Self-pay

## 2022-12-16 NOTE — Telephone Encounter (Signed)
  Called pt to confirm appt for 12/17/22 at 1345. Gave pt instructions for appt, what to wear, office address, eating/taking meds before, and if sick to call and reschedule. Pt voiced understanding, all questions answered.   Health history completed? Yes   Jonna Coup, MS, ACSM-CEP 12/16/2022 4:46 PM

## 2022-12-17 ENCOUNTER — Telehealth (HOSPITAL_COMMUNITY): Payer: Self-pay

## 2022-12-17 ENCOUNTER — Ambulatory Visit (HOSPITAL_COMMUNITY): Payer: Medicare HMO

## 2022-12-18 ENCOUNTER — Encounter (HOSPITAL_COMMUNITY): Payer: Self-pay

## 2022-12-18 ENCOUNTER — Encounter (HOSPITAL_COMMUNITY)
Admission: RE | Admit: 2022-12-18 | Discharge: 2022-12-18 | Disposition: A | Discharge status: Home / Self Care | Payer: Medicare HMO | Source: Ambulatory Visit | Attending: Cardiology | Admitting: Cardiology

## 2022-12-18 ENCOUNTER — Telehealth: Payer: Self-pay | Admitting: Adult Health

## 2022-12-18 VITALS — BP 120/74 | HR 70 | Ht 66.0 in | Wt 184.3 lb

## 2022-12-18 DIAGNOSIS — Z48812 Encounter for surgical aftercare following surgery on the circulatory system: Secondary | ICD-10-CM | POA: Diagnosis not present

## 2022-12-18 DIAGNOSIS — I252 Old myocardial infarction: Secondary | ICD-10-CM | POA: Insufficient documentation

## 2022-12-18 DIAGNOSIS — Z955 Presence of coronary angioplasty implant and graft: Secondary | ICD-10-CM

## 2022-12-18 DIAGNOSIS — I2121 ST elevation (STEMI) myocardial infarction involving left circumflex coronary artery: Secondary | ICD-10-CM

## 2022-12-18 NOTE — Progress Notes (Signed)
Cardiac Individual Treatment Plan  Patient Details  Name: RUQAYYAH LUTE MRN: [id number] Date of Birth: 23-May-1960 Referring Provider:   Flowsheet Row INTENSIVE CARDIAC REHAB ORIENT from 12/18/2022 in [facility]  Referring Provider Bryan Lemma, MD       Initial Encounter Date:  Flowsheet Row INTENSIVE CARDIAC REHAB ORIENT from 12/18/2022 in [facility] for Heart, Vascular, & Lung Health  Date 12/18/22       Visit Diagnosis: 11/21/22 STEMI  11/21/22 DES LCx  Patient's Home Medications on Admission:  Current Outpatient Medications:    amLODipine (NORVASC) 10 MG tablet, Take 1 tablet (10 mg total) by mouth daily., Disp: 30 tablet, Rfl: 5   aspirin EC 81 MG tablet, Take 1 tablet (81 mg total) by mouth daily. Swallow whole., Disp: 90 tablet, Rfl: 3   carvedilol (COREG) 6.25 MG tablet, Take 1 tablet (6.25 mg total) by mouth 2 (two) times daily with a meal., Disp: 180 tablet, Rfl: 3   clopidogrel (PLAVIX) 75 MG tablet, Take 1 tablet (75 mg total) by mouth daily with breakfast., Disp: 90 tablet, Rfl: 3   NARCAN 4 MG/0.1ML LIQD nasal spray kit, , Disp: , Rfl:    nitroGLYCERIN (NITROSTAT) 0.4 MG SL tablet, Place 1 tablet (0.4 mg total) under the tongue every 5 (five) minutes x 3 doses as needed for chest pain., Disp: 25 tablet, Rfl: 2   rivaroxaban (XARELTO) 10 MG TABS tablet, Take 1 tablet (10 mg total) by mouth daily., Disp: 30 tablet, Rfl: 3   rosuvastatin (CRESTOR) 20 MG tablet, Take 1 tablet (20 mg total) by mouth daily., Disp: 90 tablet, Rfl: 3   tiZANidine (ZANAFLEX) 2 MG tablet, Take 1 tablet (2 mg total) by mouth every 8 (eight) hours as needed for muscle spasms., Disp: 90 tablet, Rfl: 3   Vitamin D, Ergocalciferol, (DRISDOL) 1.25 MG (50000 UNIT) CAPS capsule, Take 1 capsule (50,000 Units total) by mouth every 7 (seven) days., Disp: 5 capsule, Rfl: 3  Past Medical History: Past Medical History:   Diagnosis Date   DVT (deep venous thrombosis) (HCC)    Headache(784.0)    otc meds prn    History of hysterectomy 2014   Hyperlipidemia    Hypertension 09/2018   Plantar warts 08/2019   SVD (spontaneous vaginal delivery)    x 5   Vitamin D deficiency 09/2018    Tobacco Use: Social History   Tobacco Use  Smoking Status Former   Current packs/day: 0.00   Average packs/day: 0.3 packs/day for 38.0 years (9.5 ttl pk-yrs)   Types: Cigarettes   Quit date: 11/21/2022   Years since quitting: 0.0  Smokeless Tobacco Never  Tobacco Comments   3 per day.     Labs: Review Flowsheet  More data exists      Latest Ref Rng & Units 09/14/2018 02/03/2022 05/28/2022 11/21/2022 11/22/2022  Labs for ITP Cardiac and Pulmonary Rehab  Cholestrol 0 - 200 mg/dL 409  811  914  - 782   LDL (calc) 0 - 99 mg/dL 956  213  086  - 578   HDL-C >40 mg/dL 42  45  42  - 24   Trlycerides <150 mg/dL 75  469  67  - 63   Hemoglobin A1c 4.8 - 5.6 % 5.4  5.7  - 5.7  -  PH, Arterial 7.35 - 7.45 - - - 7.357  -  PCO2 arterial 32 - 48 mmHg - - -  36.7  -  Bicarbonate 20.0 - 28.0 mmol/L - - - 20.6  -  TCO2 22 - 32 mmol/L 22 - 32 mmol/L - - - 20  22  -  Acid-base deficit 0.0 - 2.0 mmol/L - - - 4.0  -  O2 Saturation % - - - 91  -    Details       Multiple values from one day are sorted in reverse-chronological order         Capillary Blood Glucose: No results found for: "GLUCAP"   Exercise Target Goals: Exercise Program Goal: Individual exercise prescription set using results from initial 6 min walk test and THRR while considering  patient's activity barriers and safety.   Exercise Prescription Goal: Initial exercise prescription builds to 30-45 minutes a day of aerobic activity, 2-3 days per week.  Home exercise guidelines will be given to patient during program as part of exercise prescription that the participant will acknowledge.  Activity Barriers & Risk Stratification:  Activity Barriers & Cardiac  Risk Stratification - 12/18/22 1106       Activity Barriers & Cardiac Risk Stratification   Activity Barriers Joint Problems;Deconditioning;Balance Concerns;History of Falls;Back Problems;Assistive Device    Cardiac Risk Stratification High   <5 METs on            6 Minute Walk:  6 Minute Walk     Row Name 12/18/22 1207         6 Minute Walk   Phase Initial     Distance 720 feet     Walk Time 6 minutes     # of Rest Breaks 0     MPH 1.36     METS 1.85     RPE 13     Perceived Dyspnea  2     VO2 Peak 6.49     Symptoms Yes (comment)     Comments SOB, resolved with rest. 8/10 bilateral calf pain, resolved with rest.     Resting HR 64 bpm     Resting BP 120/74     Resting Oxygen Saturation  100 %     Exercise Oxygen Saturation  during 6 min walk 100 %     Max Ex. HR 70 bpm     Max Ex. BP 124/60     2 Minute Post BP 110/62              Oxygen Initial Assessment:   Oxygen Re-Evaluation:   Oxygen Discharge (Final Oxygen Re-Evaluation):   Initial Exercise Prescription:  Initial Exercise Prescription - 12/18/22 1100       Date of Initial Exercise RX and Referring Provider   Date 12/18/22    Referring Provider Bryan Lemma, MD    Expected Discharge Date 03/08/23      NuStep   Level 1    SPM 60    Minutes 15    METs 2      Prescription Details   Frequency (times per week) 3    Duration Progress to 30 minutes of continuous aerobic without signs/symptoms of physical distress      Intensity   THRR 40-80% of Max Heartrate 63-126    Ratings of Perceived Exertion 11-13    Perceived Dyspnea 0-4      Progression   Progression Continue progressive overload as per policy without signs/symptoms or physical distress.      Resistance Training   Training Prescription Yes    Weight 2    Reps 10-15  Perform Capillary Blood Glucose checks as needed.  Exercise Prescription Changes:   Exercise Comments:   Exercise Goals and  Review:   Exercise Goals     Row Name 12/18/22 1011             Exercise Goals   Increase Physical Activity Yes       Intervention Provide advice, education, support and counseling about physical activity/exercise needs.;Develop an individualized exercise prescription for aerobic and resistive training based on initial evaluation findings, risk stratification, comorbidities and participant's personal goals.       Expected Outcomes Short Term: Attend rehab on a regular basis to increase amount of physical activity.;Long Term: Exercising regularly at least 3-5 days a week.;Long Term: Add in home exercise to make exercise part of routine and to increase amount of physical activity.       Increase Strength and Stamina Yes       Intervention Provide advice, education, support and counseling about physical activity/exercise needs.;Develop an individualized exercise prescription for aerobic and resistive training based on initial evaluation findings, risk stratification, comorbidities and participant's personal goals.       Expected Outcomes Short Term: Increase workloads from initial exercise prescription for resistance, speed, and METs.;Short Term: Perform resistance training exercises routinely during rehab and add in resistance training at home;Long Term: Improve cardiorespiratory fitness, muscular endurance and strength as measured by increased METs and functional capacity ( )       Able to understand and use rate of perceived exertion (RPE) scale Yes       Intervention Provide education and explanation on how to use RPE scale       Expected Outcomes Short Term: Able to use RPE daily in rehab to express subjective intensity level;Long Term:  Able to use RPE to guide intensity level when exercising independently       Knowledge and understanding of Target Heart Rate Range (THRR) Yes       Intervention Provide education and explanation of THRR including how the numbers were predicted and where  they are located for reference       Expected Outcomes Short Term: Able to state/look up THRR;Short Term: Able to use daily as guideline for intensity in rehab;Long Term: Able to use THRR to govern intensity when exercising independently       Understanding of Exercise Prescription Yes       Intervention Provide education, explanation, and written materials on patient's individual exercise prescription       Expected Outcomes Short Term: Able to explain program exercise prescription;Long Term: Able to explain home exercise prescription to exercise independently                Exercise Goals Re-Evaluation :   Discharge Exercise Prescription (Final Exercise Prescription Changes):   Nutrition:  Target Goals: Understanding of nutrition guidelines, daily intake of sodium 1500mg , cholesterol 200mg , calories 30% from fat and 7% or less from saturated fats, daily to have 5 or more servings of fruits and vegetables.  Biometrics:  Pre Biometrics - 12/18/22 1025       Pre Biometrics   Waist Circumference 43 inches    Hip Circumference 47 inches    Waist to Hip Ratio 0.91 %    Triceps Skinfold 46 mm    % Body Fat 44.6 %    Grip Strength 12 kg    Flexibility --   not done due to low back pain   Single Leg Stand --   not done, pt  uses cane             Nutrition Therapy Plan and Nutrition Goals:   Nutrition Assessments:  MEDIFICTS Score Key: >=70 Need to make dietary changes  40-70 Heart Healthy Diet <= 40 Therapeutic Level Cholesterol Diet    Picture Your Plate Scores: <30 Unhealthy dietary pattern with much room for improvement. 41-50 Dietary pattern unlikely to meet recommendations for good health and room for improvement. 51-60 More healthful dietary pattern, with some room for improvement.  >60 Healthy dietary pattern, although there may be some specific behaviors that could be improved.    Nutrition Goals Re-Evaluation:   Nutrition Goals  Re-Evaluation:   Nutrition Goals Discharge (Final Nutrition Goals Re-Evaluation):   Psychosocial: Target Goals: Acknowledge presence or absence of significant depression and/or stress, maximize coping skills, provide positive support system. Participant is able to verbalize types and ability to use techniques and skills needed for reducing stress and depression.  Initial Review & Psychosocial Screening:  Initial Psych Review & Screening - 12/18/22 1029       Initial Review   Current issues with Current Depression;Current Stress Concerns    Source of Stress Concerns Family    Comments Lataria shared that she has had some feelings of depression since losing her son and husband 3 years ago, along with other familial stress. She has a large family and a lot of support, however is still grieving her recent losses. Aleila is not in counseling or on medication, however she is interested in receiving additional resources. Will discuss with RN to encourage Jelene to schedule with PCP.      Family Dynamics   Good Support System? Yes   Camisha has her daughters for support     Barriers   Psychosocial barriers to participate in program The patient should benefit from training in stress management and relaxation.      Screening Interventions   Interventions To provide support and resources with identified psychosocial needs;Provide feedback about the scores to participant;Encouraged to exercise    Expected Outcomes Short Term goal: Utilizing psychosocial counselor, staff and physician to assist with identification of specific Stressors or current issues interfering with healing process. Setting desired goal for each stressor or current issue identified.;Long Term Goal: Stressors or current issues are controlled or eliminated.;Short Term goal: Identification and review with participant of any Quality of Life or Depression concerns found by scoring the questionnaire.;Long Term goal: The participant  improves quality of Life and PHQ9 Scores as seen by post scores and/or verbalization of changes             Quality of Life Scores:  Quality of Life - 12/18/22 1106       Quality of Life   Select Quality of Life      Quality of Life Scores   Health/Function Pre 20.8 %    Socioeconomic Pre 19.5 %    Psych/Spiritual Pre 24.86 %    Family Pre 28.8 %    GLOBAL Pre 22.54 %            Scores of 19 and below usually indicate a poorer quality of life in these areas.  A difference of  2-3 points is a clinically meaningful difference.  A difference of 2-3 points in the total score of the Quality of Life Index has been associated with significant improvement in overall quality of life, self-image, physical symptoms, and general health in studies assessing change in quality of life.  PHQ-9: Review Flowsheet  More data exists      12/18/2022 06/17/2022 05/20/2022 03/30/2022 10/25/2018  Depression screen PHQ 2/9  Decreased Interest 2 0 0 2 0  Down, Depressed, Hopeless 0 0 0 1 0  PHQ - 2 Score 2 0 0 3 0  Altered sleeping 3 - - 3 -  Tired, decreased energy 2 - - 3 -  Change in appetite 2 - - 0 -  Feeling bad or failure about yourself  0 - - 0 -  Trouble concentrating 0 - - 0 -  Moving slowly or fidgety/restless 2 - - 3 -  Suicidal thoughts 0 - - 0 -  PHQ-9 Score 11 - - 12 -  Difficult doing work/chores Somewhat difficult - - Not difficult at all -    Details           Interpretation of Total Score  Total Score Depression Severity:  1-4 = Minimal depression, 5-9 = Mild depression, 10-14 = Moderate depression, 15-19 = Moderately severe depression, 20-27 = Severe depression   Psychosocial Evaluation and Intervention:   Psychosocial Re-Evaluation:   Psychosocial Discharge (Final Psychosocial Re-Evaluation):   Vocational Rehabilitation: Provide vocational rehab assistance to qualifying candidates.   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 Participant will state understanding/return demonstration of topics presented.     Core Videos: Exercise    Move It!  Clinical staff conducted group or individual video education with verbal and written material and guidebook.  Patient learns the recommended Pritikin exercise program. Exercise with the goal of living a long, healthy life. Some of the health benefits of exercise include controlled diabetes, healthier blood pressure levels, improved cholesterol levels, improved heart and lung capacity, improved sleep, and better body composition. Everyone should speak with their doctor before starting or changing an exercise routine.  Biomechanical Limitations Clinical staff conducted group or individual video education with verbal and written material and guidebook.  Patient learns how biomechanical limitations can impact exercise and how we can mitigate and possibly overcome limitations to have an impactful and balanced exercise routine.  Body Composition Clinical staff conducted group or individual video education with verbal and written material and guidebook.  Patient learns that body composition (ratio of muscle mass to fat mass) is a key component to assessing overall fitness, rather than body weight alone. Increased fat mass, especially visceral belly fat, can put [place] at increased risk for metabolic syndrome, type 2 diabetes, heart disease, and even death. It is recommended to combine diet and exercise (cardiovascular and resistance training) to improve your body composition. Seek guidance from your physician and exercise physiologist before implementing an exercise routine.  Exercise Action Plan Clinical staff  conducted group or individual video education with verbal and written material and guidebook.  Patient learns the recommended strategies to achieve and enjoy long-term exercise adherence, including variety, self-motivation, self-efficacy, and positive decision making. Benefits of exercise include fitness, good health, weight management, more energy, better sleep, less stress, and overall well-being.  Medical   Heart Disease Risk Reduction Clinical staff conducted group or individual video education with verbal and written material and guidebook.  Patient learns our heart is our most vital  organ as it circulates oxygen, nutrients, white blood cells, and hormones throughout the entire body, and carries waste away. Data supports a plant-based eating plan like the Pritikin Program for its effectiveness in slowing progression of and reversing heart disease. The video provides a number of recommendations to address heart disease.   Metabolic Syndrome and Belly Fat  Clinical staff conducted group or individual video education with verbal and written material and guidebook.  Patient learns what metabolic syndrome is, how it leads to heart disease, and how one can reverse it and keep it from coming back. You have metabolic syndrome if you have 3 of the following 5 criteria: abdominal obesity, high blood pressure, high triglycerides, low HDL cholesterol, and high blood sugar.  Hypertension and Heart Disease Clinical staff conducted group or individual video education with verbal and written material and guidebook.  Patient learns that high blood pressure, or hypertension, is very common in the [place]. Hypertension is largely due to excessive salt intake, but other important risk factors include being overweight, physical inactivity, drinking too much alcohol, smoking, and not eating enough potassium from fruits and vegetables. High blood pressure is a leading risk factor for heart attack, stroke,  congestive heart failure, dementia, kidney failure, and premature death. Long-term effects of excessive salt intake include stiffening of the arteries and thickening of heart muscle and organ damage. Recommendations include ways to reduce hypertension and the risk of heart disease.  Diseases of Our Time - Focusing on Diabetes Clinical staff conducted group or individual video education with verbal and written material and guidebook.  Patient learns why the best way to stop diseases of our time is prevention, through food and other lifestyle changes. Medicine (such as prescription pills and surgeries) is often only a Band-Aid on the problem, not a long-term solution. Most common diseases of our time include obesity, type 2 diabetes, hypertension, heart disease, and cancer. The Pritikin Program is recommended and has been proven to help reduce, reverse, and/or prevent the damaging effects of metabolic syndrome.  Nutrition   Overview of the Pritikin Eating Plan  Clinical staff conducted group or individual video education with verbal and written material and guidebook.  Patient learns about the Pritikin Eating Plan for disease risk reduction. The Pritikin Eating Plan emphasizes a wide variety of unrefined, minimally-processed carbohydrates, like fruits, vegetables, whole grains, and legumes. Go, Caution, and Stop food choices are explained. Plant-based and lean animal proteins are emphasized. Rationale provided for low sodium intake for blood pressure control, low added sugars for blood sugar stabilization, and low added fats and oils for coronary artery disease risk reduction and weight management.  Calorie Density  Clinical staff conducted group or individual video education with verbal and written material and guidebook.  Patient learns about calorie density and how it impacts the Pritikin Eating Plan. Knowing the characteristics of the food you choose will help you decide whether those foods will lead  to weight gain or weight loss, and whether you want to consume more or less of them. Weight loss is usually a side effect of the Pritikin Eating Plan because of its focus on low calorie-dense foods.  Label Reading  Clinical staff conducted group or individual video education with verbal and written material and guidebook.  Patient learns about the Pritikin recommended label reading guidelines and corresponding recommendations regarding calorie density, added sugars, sodium content, and whole grains.  Dining Out - Part 1  Clinical staff conducted group or individual video education with verbal and  written material and guidebook.  Patient learns that restaurant meals can be sabotaging because they can be so high in calories, fat, sodium, and/or sugar. Patient learns recommended strategies on how to positively address this and avoid unhealthy pitfalls.  Facts on Fats  Clinical staff conducted group or individual video education with verbal and written material and guidebook.  Patient learns that lifestyle modifications can be just as effective, if not more so, as many medications for lowering your risk of heart disease. A Pritikin lifestyle can help to reduce your risk of inflammation and atherosclerosis (cholesterol build-up, or plaque, in the artery walls). Lifestyle interventions such as dietary choices and physical activity address the cause of atherosclerosis. A review of the types of fats and their impact on blood cholesterol levels, along with dietary recommendations to reduce fat intake is also included.  Nutrition Action Plan  Clinical staff conducted group or individual video education with verbal and written material and guidebook.  Patient learns how to incorporate Pritikin recommendations into their lifestyle. Recommendations include planning and keeping personal health goals in mind as an important part of their success.  Healthy Mind-Set    Healthy Minds, Bodies, Hearts  Clinical  staff conducted group or individual video education with verbal and written material and guidebook.  Patient learns how to identify when they are stressed. Video will discuss the impact of that stress, as well as the many benefits of stress management. Patient will also be introduced to stress management techniques. The way we think, act, and feel has an impact on our hearts.  How Our Thoughts Can Heal Our Hearts  Clinical staff conducted group or individual video education with verbal and written material and guidebook.  Patient learns that negative thoughts can cause depression and anxiety. This can result in negative lifestyle behavior and serious health problems. Cognitive behavioral therapy is an effective method to help control our thoughts in order to change and improve our emotional outlook.  Additional Videos:  Exercise    Improving Performance  Clinical staff conducted group or individual video education with verbal and written material and guidebook.  Patient learns to use a non-linear approach by alternating intensity levels and lengths of time spent exercising to help burn more calories and lose more body fat. Cardiovascular exercise helps improve heart health, metabolism, hormonal balance, blood sugar control, and recovery from fatigue. Resistance training improves strength, endurance, balance, coordination, reaction time, metabolism, and muscle mass. Flexibility exercise improves circulation, posture, and balance. Seek guidance from your physician and exercise physiologist before implementing an exercise routine and learn your capabilities and proper form for all exercise.  Introduction to Yoga  Clinical staff conducted group or individual video education with verbal and written material and guidebook.  Patient learns about yoga, a discipline of the coming together of mind, breath, and body. The benefits of yoga include improved flexibility, improved range of motion, better posture and  core strength, increased lung function, weight loss, and positive self-image. Yoga's heart health benefits include lowered blood pressure, healthier heart rate, decreased cholesterol and triglyceride levels, improved immune function, and reduced stress. Seek guidance from your physician and exercise physiologist before implementing an exercise routine and learn your capabilities and proper form for all exercise.  Medical   Aging: Enhancing Your Quality of Life  Clinical staff conducted group or individual video education with verbal and written material and guidebook.  Patient learns key strategies and recommendations to stay in good physical health and enhance quality of life, such as  prevention strategies, having an advocate, securing a Health Care Proxy and Power of Attorney, and keeping a list of medications and system for tracking them. It also discusses how to avoid risk for bone loss.  Biology of Weight Control  Clinical staff conducted group or individual video education with verbal and written material and guidebook.  Patient learns that weight gain occurs because we consume more calories than we burn (eating more, moving less). Even if your body weight is normal, you may have higher ratios of fat compared to muscle mass. Too much body fat puts you at increased risk for cardiovascular disease, heart attack, stroke, type 2 diabetes, and obesity-related cancers. In addition to exercise, following the Pritikin Eating Plan can help reduce your risk.  Decoding Lab Results  Clinical staff conducted group or individual video education with verbal and written material and guidebook.  Patient learns that lab test reflects one measurement whose values change over time and are influenced by many factors, including medication, stress, sleep, exercise, food, hydration, pre-existing medical conditions, and more. It is recommended to use the knowledge from this video to become more involved with your lab  results and evaluate your numbers to speak with your doctor.   Diseases of Our Time - Overview  Clinical staff conducted group or individual video education with verbal and written material and guidebook.  Patient learns that according to the CDC, 50% to 70% of chronic diseases (such as obesity, type 2 diabetes, elevated lipids, hypertension, and heart disease) are avoidable through lifestyle improvements including healthier food choices, listening to satiety cues, and increased physical activity.  Sleep Disorders Clinical staff conducted group or individual video education with verbal and written material and guidebook.  Patient learns how good quality and duration of sleep are important to overall health and well-being. Patient also learns about sleep disorders and how they impact health along with recommendations to address them, including discussing with a physician.  Nutrition  Dining Out - Part 2 Clinical staff conducted group or individual video education with verbal and written material and guidebook.  Patient learns how to plan ahead and communicate in order to maximize their dining experience in a healthy and nutritious manner. Included are recommended food choices based on the type of restaurant the patient is visiting.   Fueling a Banker conducted group or individual video education with verbal and written material and guidebook.  There is a strong connection between our food choices and our health. Diseases like obesity and type 2 diabetes are very prevalent and are in large-part due to lifestyle choices. The Pritikin Eating Plan provides plenty of food and hunger-curbing satisfaction. It is easy to follow, affordable, and helps reduce health risks.  Menu Workshop  Clinical staff conducted group or individual video education with verbal and written material and guidebook.  Patient learns that restaurant meals can sabotage health goals because they are often  packed with calories, fat, sodium, and sugar. Recommendations include strategies to plan ahead and to communicate with the manager, chef, or server to help order a healthier meal.  Planning Your Eating Strategy  Clinical staff conducted group or individual video education with verbal and written material and guidebook.  Patient learns about the Pritikin Eating Plan and its benefit of reducing the risk of disease. The Pritikin Eating Plan does not focus on calories. Instead, it emphasizes high-quality, nutrient-rich foods. By knowing the characteristics of the foods, we choose, we can determine their calorie density and make informed  decisions.  Targeting Your Nutrition Priorities  Clinical staff conducted group or individual video education with verbal and written material and guidebook.  Patient learns that lifestyle habits have a tremendous impact on disease risk and progression. This video provides eating and physical activity recommendations based on your personal health goals, such as reducing LDL cholesterol, losing weight, preventing or controlling type 2 diabetes, and reducing high blood pressure.  Vitamins and Minerals  Clinical staff conducted group or individual video education with verbal and written material and guidebook.  Patient learns different ways to obtain key vitamins and minerals, including through a recommended healthy diet. It is important to discuss all supplements you take with your doctor.   Healthy Mind-Set    Smoking Cessation  Clinical staff conducted group or individual video education with verbal and written material and guidebook.  Patient learns that cigarette smoking and tobacco addiction pose a serious health risk which affects millions of people. Stopping smoking will significantly reduce the risk of heart disease, lung disease, and many forms of cancer. Recommended strategies for quitting are covered, including working with your doctor to develop a successful  plan.  Culinary   Becoming a Set designer conducted group or individual video education with verbal and written material and guidebook.  Patient learns that cooking at home can be healthy, cost-effective, quick, and puts them in control. Keys to cooking healthy recipes will include looking at your recipe, assessing your equipment needs, planning ahead, making it simple, choosing cost-effective seasonal ingredients, and limiting the use of added fats, salts, and sugars.  Cooking - Breakfast and Snacks  Clinical staff conducted group or individual video education with verbal and written material and guidebook.  Patient learns how important breakfast is to satiety and nutrition through the entire day. Recommendations include key foods to eat during breakfast to help stabilize blood sugar levels and to prevent overeating at meals later in the day. Planning ahead is also a key component.  Cooking - Educational psychologist conducted group or individual video education with verbal and written material and guidebook.  Patient learns eating strategies to improve overall health, including an approach to cook more at home. Recommendations include thinking of animal protein as a side on your plate rather than center stage and focusing instead on lower calorie dense options like vegetables, fruits, whole grains, and plant-based proteins, such as beans. Making sauces in large quantities to freeze for later and leaving the skin on your vegetables are also recommended to maximize your experience.  Cooking - Healthy Salads and Dressing Clinical staff conducted group or individual video education with verbal and written material and guidebook.  Patient learns that vegetables, fruits, whole grains, and legumes are the foundations of the Pritikin Eating Plan. Recommendations include how to incorporate each of these in flavorful and healthy salads, and how to create homemade salad dressings.  Proper handling of ingredients is also covered. Cooking - Soups and [employer] - Soups and Desserts Clinical staff conducted group or individual video education with verbal and written material and guidebook.  Patient learns that Pritikin soups and desserts make for easy, nutritious, and delicious snacks and meal components that are low in sodium, fat, sugar, and calorie density, while high in vitamins, minerals, and filling fiber. Recommendations include simple and healthy ideas for soups and desserts.   Overview     The Pritikin Solution Program Overview Clinical staff conducted group or individual video education with verbal and written  material and guidebook.  Patient learns that the results of the Pritikin Program have been documented in more than 100 articles published in peer-reviewed journals, and the benefits include reducing risk factors for (and, in some cases, even reversing) high cholesterol, high blood pressure, type 2 diabetes, obesity, and more! An overview of the three key pillars of the Pritikin Program will be covered: eating well, doing regular exercise, and having a healthy mind-set.  WORKSHOPS  Exercise: Exercise Basics: Building Your Action Plan Clinical staff led group instruction and group discussion with PowerPoint presentation and patient guidebook. To enhance the learning environment the use of posters, models and videos may be added. At the conclusion of this workshop, patients will comprehend the difference between physical activity and exercise, as well as the benefits of incorporating both, into their routine. Patients will understand the FITT (Frequency, Intensity, Time, and Type) principle and how to use it to build an exercise action plan. In addition, safety concerns and other considerations for exercise and cardiac rehab will be addressed by the presenter. The purpose of this lesson is to promote a comprehensive and effective weekly exercise routine in  order to improve patients' overall level of fitness.   Managing Heart Disease: Your Path to a Healthier Heart Clinical staff led group instruction and group discussion with PowerPoint presentation and patient guidebook. To enhance the learning environment the use of posters, models and videos may be added.At the conclusion of this workshop, patients will understand the anatomy and physiology of the heart. Additionally, they will understand how Pritikin's three pillars impact the risk factors, the progression, and the management of heart disease.  The purpose of this lesson is to provide a high-level overview of the heart, heart disease, and how the Pritikin lifestyle positively impacts risk factors.  Exercise Biomechanics Clinical staff led group instruction and group discussion with PowerPoint presentation and patient guidebook. To enhance the learning environment the use of posters, models and videos may be added. Patients will learn how the structural parts of their bodies function and how these functions impact their daily activities, movement, and exercise. Patients will learn how to promote a neutral spine, learn how to manage pain, and identify ways to improve their physical movement in order to promote healthy living. The purpose of this lesson is to expose patients to common physical limitations that impact physical activity. Participants will learn practical ways to adapt and manage aches and pains, and to minimize their effect on regular exercise. Patients will learn how to maintain good posture while sitting, walking, and lifting.  Balance Training and Fall Prevention  Clinical staff led group instruction and group discussion with PowerPoint presentation and patient guidebook. To enhance the learning environment the use of posters, models and videos may be added. At the conclusion of this workshop, patients will understand the importance of their sensorimotor skills (vision,  proprioception, and the vestibular system) in maintaining their ability to balance as they age. Patients will apply a variety of balancing exercises that are appropriate for their current level of function. Patients will understand the common causes for poor balance, possible solutions to these problems, and ways to modify their physical environment in order to minimize their fall risk. The purpose of this lesson is to teach patients about the importance of maintaining balance as they age and ways to minimize their risk of falling.  WORKSHOPS   Nutrition:  Fueling a Ship broker led group instruction and group discussion with PowerPoint presentation and patient guidebook.  To enhance the learning environment the use of posters, models and videos may be added. Patients will review the foundational principles of the Pritikin Eating Plan and understand what constitutes a serving size in each of the food groups. Patients will also learn Pritikin-friendly foods that are better choices when away from home and review make-ahead meal and snack options. Calorie density will be reviewed and applied to three nutrition priorities: weight maintenance, weight loss, and weight gain. The purpose of this lesson is to reinforce (in a group setting) the key concepts around what patients are recommended to eat and how to apply these guidelines when away from home by planning and selecting Pritikin-friendly options. Patients will understand how calorie density may be adjusted for different weight management goals.  Mindful Eating  Clinical staff led group instruction and group discussion with PowerPoint presentation and patient guidebook. To enhance the learning environment the use of posters, models and videos may be added. Patients will briefly review the concepts of the Pritikin Eating Plan and the importance of low-calorie dense foods. The concept of mindful eating will be introduced as well as the  importance of paying attention to internal hunger signals. Triggers for non-hunger eating and techniques for dealing with triggers will be explored. The purpose of this lesson is to provide patients with the opportunity to review the basic principles of the Pritikin Eating Plan, discuss the value of eating mindfully and how to measure internal cues of hunger and fullness using the Hunger Scale. Patients will also discuss reasons for non-hunger eating and learn strategies to use for controlling emotional eating.  Targeting Your Nutrition Priorities Clinical staff led group instruction and group discussion with PowerPoint presentation and patient guidebook. To enhance the learning environment the use of posters, models and videos may be added. Patients will learn how to determine their genetic susceptibility to disease by reviewing their family history. Patients will gain insight into the importance of diet as part of an overall healthy lifestyle in mitigating the impact of genetics and other environmental insults. The purpose of this lesson is to provide patients with the opportunity to assess their personal nutrition priorities by looking at their family history, their own health history and current risk factors. Patients will also be able to discuss ways of prioritizing and modifying the Pritikin Eating Plan for their highest risk areas  Menu  Clinical staff led group instruction and group discussion with PowerPoint presentation and patient guidebook. To enhance the learning environment the use of posters, models and videos may be added. Using menus brought in from [employer], or printed from [employer], patients will apply the Pritikin dining out guidelines that were presented in the [employer] video. Patients will also be able to practice these guidelines in a variety of provided scenarios. The purpose of this lesson is to provide patients with the opportunity to practice  hands-on learning of the Pritikin Dining Out guidelines with actual menus and practice scenarios.  Label Reading Clinical staff led group instruction and group discussion with PowerPoint presentation and patient guidebook. To enhance the learning environment the use of posters, models and videos may be added. Patients will review and discuss the Pritikin label reading guidelines presented in Pritikin's Label Reading Educational series video. Using fool labels brought in from local grocery stores and markets, patients will apply the label reading guidelines and determine if the packaged food meet the Pritikin guidelines. The purpose of this lesson is to provide patients with the opportunity to  review, discuss, and practice hands-on learning of the Pritikin Label Reading guidelines with actual packaged food labels. Cooking School  Pritikin's [employer] are designed to teach patients ways to prepare quick, simple, and affordable recipes at home. The importance of nutrition's role in chronic disease risk reduction is reflected in its emphasis in the overall Pritikin program. By learning how to prepare essential core Pritikin Eating Plan recipes, patients will increase control over what they eat; be able to customize the flavor of foods without the use of added salt, sugar, or fat; and improve the quality of the food they consume. By learning a set of core recipes which are easily assembled, quickly prepared, and affordable, patients are more likely to prepare more healthy foods at home. These workshops focus on convenient breakfasts, simple entres, side dishes, and desserts which can be prepared with minimal effort and are consistent with nutrition recommendations for cardiovascular risk reduction. Cooking [employer] are taught by a Armed forces logistics/support/administrative officer (RD) who has been trained by the [employer]. The chef or RD has a clear understanding of the importance of minimizing -  if not completely eliminating - added fat, sugar, and sodium in recipes. Throughout the series of Cooking School Workshop sessions, patients will learn about healthy ingredients and efficient methods of cooking to build confidence in their capability to prepare    Cooking School weekly topics:  Adding Flavor- Sodium-Free  Fast and Healthy Breakfasts  Powerhouse Plant-Based Proteins  Satisfying Salads and Dressings  Simple Sides and Sauces  International Cuisine-Spotlight on the [employer] Zones  Delicious Desserts  Savory Soups  [employer] - Meals in a Astronomer Appetizers and Snacks  Comforting Weekend Breakfasts  One-Pot Wonders   Fast Evening Meals  Landscape architect Your Pritikin Plate  WORKSHOPS   Healthy Mindset (Psychosocial):  Focused Goals, Sustainable Changes Clinical staff led group instruction and group discussion with PowerPoint presentation and patient guidebook. To enhance the learning environment the use of posters, models and videos may be added. Patients will be able to apply effective goal setting strategies to establish at least one personal goal, and then take consistent, meaningful action toward that goal. They will learn to identify common barriers to achieving personal goals and develop strategies to overcome them. Patients will also gain an understanding of how our mind-set can impact our ability to achieve goals and the importance of cultivating a positive and growth-oriented mind-set. The purpose of this lesson is to provide patients with a deeper understanding of how to set and achieve personal goals, as well as the tools and strategies needed to overcome common obstacles which may arise along the way.  From Head to Heart: The Power of a Healthy Outlook  Clinical staff led group instruction and group discussion with PowerPoint presentation and patient guidebook. To enhance the learning environment the use of posters, models and videos may be  added. Patients will be able to recognize and describe the impact of emotions and mood on physical health. They will discover the importance of self-care and explore self-care practices which may work for them. Patients will also learn how to utilize the 4 C's to cultivate a healthier outlook and better manage stress and challenges. The purpose of this lesson is to demonstrate to patients how a healthy outlook is an essential part of maintaining good health, especially as they continue their cardiac rehab journey.  Healthy Sleep for a Healthy Heart Clinical staff led group instruction and  group discussion with PowerPoint presentation and patient guidebook. To enhance the learning environment the use of posters, models and videos may be added. At the conclusion of this workshop, patients will be able to demonstrate knowledge of the importance of sleep to overall health, well-being, and quality of life. They will understand the symptoms of, and treatments for, common sleep disorders. Patients will also be able to identify daytime and nighttime behaviors which impact sleep, and they will be able to apply these tools to help manage sleep-related challenges. The purpose of this lesson is to provide patients with a general overview of sleep and outline the importance of quality sleep. Patients will learn about a few of the most common sleep disorders. Patients will also be introduced to the concept of "sleep hygiene," and discover ways to self-manage certain sleeping problems through simple daily behavior changes. Finally, the workshop will motivate patients by clarifying the links between quality sleep and their goals of heart-healthy living.   Recognizing and Reducing Stress Clinical staff led group instruction and group discussion with PowerPoint presentation and patient guidebook. To enhance the learning environment the use of posters, models and videos may be added. At the conclusion of this workshop, patients  will be able to understand the types of stress reactions, differentiate between acute and chronic stress, and recognize the impact that chronic stress has on their health. They will also be able to apply different coping mechanisms, such as reframing negative self-talk. Patients will have the opportunity to practice a variety of stress management techniques, such as deep abdominal breathing, progressive muscle relaxation, and/or guided imagery.  The purpose of this lesson is to educate patients on the role of stress in their lives and to provide healthy techniques for coping with it.  Learning Barriers/Preferences:  Learning Barriers/Preferences - 12/18/22 1031       Learning Barriers/Preferences   Learning Barriers Sight   wears glasses   Learning Preferences Audio;Computer/Internet;Group Instruction;Individual Instruction;Pictoral;Skilled Demonstration;Verbal Instruction;Video;Written Material             Education Topics:  Knowledge Questionnaire Score:  Knowledge Questionnaire Score - 12/18/22 1049       Knowledge Questionnaire Score   Pre Score 16/24             Core Components/Risk Factors/Patient Goals at Admission:  Personal Goals and Risk Factors at Admission - 12/18/22 1012       Core Components/Risk Factors/Patient Goals on Admission    Weight Management Yes;Weight Loss    Intervention Weight Management: Develop a combined nutrition and exercise program designed to reach desired caloric intake, while maintaining appropriate intake of nutrient and fiber, sodium and fats, and appropriate energy expenditure required for the weight goal.;Weight Management: Provide education and appropriate resources to help participant work on and attain dietary goals.    Expected Outcomes Short Term: Continue to assess and modify interventions until short term weight is achieved;Long Term: Adherence to nutrition and physical activity/exercise program aimed toward attainment of  established weight goal;Weight Loss: Understanding of general recommendations for a balanced deficit meal plan, which promotes 1-2 lb weight loss per week and includes a negative energy balance of [phone] kcal/d;Understanding recommendations for meals to include 15-35% energy as protein, 25-35% energy from fat, 35-60% energy from carbohydrates, less than 200mg  of dietary cholesterol, 20-35 gm of total fiber daily;Understanding of distribution of calorie intake throughout the day with the consumption of 4-5 meals/snacks    Hypertension Yes    Intervention Provide education on lifestyle modifcations including  regular physical activity/exercise, weight management, moderate sodium restriction and increased consumption of fresh fruit, vegetables, and low fat dairy, alcohol moderation, and smoking cessation.;Monitor prescription use compliance.    Expected Outcomes Long Term: Maintenance of blood pressure at goal levels.;Short Term: Continued assessment and intervention until BP is < 140/28mm HG in hypertensive participants. < 130/52mm HG in hypertensive participants with diabetes, heart failure or chronic kidney disease.    Lipids Yes    Intervention Provide education and support for participant on nutrition & aerobic/resistive exercise along with prescribed medications to achieve LDL 70mg , HDL >40mg .    Expected Outcomes Long Term: Cholesterol controlled with medications as prescribed, with individualized exercise RX and with personalized nutrition plan. Value goals: LDL < 70mg , HDL > 40 mg.;Short Term: Participant states understanding of desired cholesterol values and is compliant with medications prescribed. Participant is following exercise prescription and nutrition guidelines.    Stress Yes    Intervention Offer individual and/or small group education and counseling on adjustment to heart disease, stress management and health-related lifestyle change. Teach and support self-help strategies.;Refer  participants experiencing significant psychosocial distress to appropriate mental health specialists for further evaluation and treatment. When possible, include family members and significant others in education/counseling sessions.    Expected Outcomes Long Term: Emotional wellbeing is indicated by absence of clinically significant psychosocial distress or social isolation.;Short Term: Participant demonstrates changes in health-related behavior, relaxation and other stress management skills, ability to obtain effective social support, and compliance with psychotropic medications if prescribed.             Core Components/Risk Factors/Patient Goals Review:    Core Components/Risk Factors/Patient Goals at Discharge (Final Review):    ITP Comments:  ITP Comments     Row Name 12/18/22 1011           ITP Comments Dr. Armanda Magic medical director. Introduction to [facility] education/intensive cardiac rehab. Initial orientation packet reviewed with patient.                Comments: Participant attended orientation for the cardiac rehabilitation program on  12/18/2022  to perform initial intake and exercise walk test. Patient introduced to the [facility] education and orientation packet was reviewed. Completed 6-minute walk test, measurements, initial ITP, and exercise prescription. Vital signs stable. Telemetry-normal sinus rhythm, asymptomatic. Pt with bilateral ankle/lower leg swelling, notified RN Carlette who examined pt and called PCP. During pt had 8/10 bilateral calf pain and SOB with RPD =2, VSS SpO2 100%, both resolved with rest once seated post .    Service time was from 1002 to 1158.  Jonna Coup, MS, ACSM-CEP 12/18/2022 12:10 PM

## 2022-12-18 NOTE — Progress Notes (Signed)
[name] in today for cardiac rehab orientation.  Noted upon assessment bilateral lower extremity 1+ pitting edema.  Pt notes that her ankles and feet hurt and it was difficult to find a shoe that would fit comfortable this morning.  Noticed for several days.  Denies any shortness of breath or excessive salt intake. Weight today 184 last weight recorded in [facility] 181 on 9/24. 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   Appt scheduled for 10/15 at 2:40.  Pt will ask for behavioral health referral for counseling for grief. [name] husband passed away and she moved to be closer to her daughter. [name] to hold on exercise until she completes the appt on Tuesday.  Verbalized understanding. Alanson Aly, BSN Cardiac and Emergency planning/management officer

## 2022-12-18 NOTE — Progress Notes (Signed)
Cardiac Rehab Medication Review   Does the patient  feel that his/her medications are working for him/her?  YES  Has the patient been experiencing any side effects to the medications prescribed?  NO  Does the patient measure his/her own blood pressure or blood glucose at home? YES    Does the patient have any problems obtaining medications due to transportation or finances?    NO  Understanding of regimen: excellent Understanding of indications: excellent Potential of compliance: excellent    Comments: [name] understands her medications and regime well. She checks her BP 2x a day at home.     Jonna Coup, MS, ACSM-CEP 12/18/2022 10:32 AM

## 2022-12-18 NOTE — Telephone Encounter (Signed)
The [facility] called to inform us that [name] came to their office this morning with bilateral leg swelling, a weight gain of 3 lbs since 12/01/22, and a desire for counseling services. They thought the patient should be seen within a week, so the first available appointment was with [name]. This note is to alert [name] and [name] (her PCP) the reason for this appointment.

## 2022-12-18 NOTE — Telephone Encounter (Signed)
noted 

## 2022-12-21 ENCOUNTER — Encounter (HOSPITAL_COMMUNITY): Payer: Medicare HMO

## 2022-12-22 ENCOUNTER — Other Ambulatory Visit (HOSPITAL_COMMUNITY): Payer: Self-pay

## 2022-12-22 ENCOUNTER — Encounter: Payer: Self-pay | Admitting: Family

## 2022-12-22 ENCOUNTER — Ambulatory Visit (INDEPENDENT_AMBULATORY_CARE_PROVIDER_SITE_OTHER): Payer: Medicare HMO | Admitting: Family

## 2022-12-22 VITALS — BP 130/82 | HR 70 | Temp 98.2°F | Resp 20 | Ht 66.0 in | Wt 184.2 lb

## 2022-12-22 DIAGNOSIS — M5442 Lumbago with sciatica, left side: Secondary | ICD-10-CM

## 2022-12-22 DIAGNOSIS — G8929 Other chronic pain: Secondary | ICD-10-CM | POA: Diagnosis not present

## 2022-12-22 DIAGNOSIS — M5441 Lumbago with sciatica, right side: Secondary | ICD-10-CM | POA: Diagnosis not present

## 2022-12-22 DIAGNOSIS — R600 Localized edema: Secondary | ICD-10-CM | POA: Diagnosis not present

## 2022-12-22 MED ORDER — HYDROCHLOROTHIAZIDE 12.5 MG PO TABS
12.5000 mg | ORAL_TABLET | Freq: Every day | ORAL | 3 refills | Status: DC
  Filled 2022-12-22: qty 90, 90d supply, fill #0
  Filled 2023-06-28 – 2023-08-24 (×2): qty 90, 90d supply, fill #1

## 2022-12-22 NOTE — Progress Notes (Signed)
Provider: Richarda Blade FNP-C  Medina-Vargas, Margit Banda, NP  Patient Care Team: Gillis Santa, NP as PCP - General (Internal Medicine) Marykay Lex, MD as PCP - Cardiology (Cardiology)  Extended Emergency Contact Information Primary Emergency Contact: Shiela Mayer Address: [address]           [place], [place] [address] [place] of [place] Home Phone: [phone] Mobile Phone: [phone] Relation: Daughter  Code Status:  Full Code  Goals of care: Advanced Directive information    12/22/2022    2:38 PM  Advanced Directives  Does Patient Have a Medical Advance Directive? No  Would patient like information on creating a medical advance directive? No - Patient declined     Chief Complaint  Patient presents with   Acute Visit    Patient presents today for bilateral leg/ ankle swelling for a couple of months now. She denies taking any lasix.    HPI:  Pt is a 62 y.o. female seen today for an acute visit for evaluation of bilateral leg and ankle edema on and off for one month.She denies any cough,fatigue,chest tightness,chest pain,palpitation or shortness of breath. Has had 3 pounds weight gain from previous visit.   Past Medical History:  Diagnosis Date   DVT (deep venous thrombosis) (HCC)    Headache(784.0)    otc meds prn    Heart attack (HCC) 11/22/2022   History of hysterectomy 2014   Hyperlipidemia    Hypertension 09/2018   Plantar warts 08/2019   SVD (spontaneous vaginal delivery)    x 5   Vitamin D deficiency 09/2018   Past Surgical History:  Procedure Laterality Date   ABDOMINAL HYSTERECTOMY  04/06/2012   Procedure: HYSTERECTOMY ABDOMINAL;  Surgeon: [name], MD;  Location: [facility];  Service: Gynecology;  Laterality: N/A;   BREAST SURGERY     benign turmor removed   CORONARY/GRAFT ACUTE MI REVASCULARIZATION N/A 11/21/2022   Procedure: Coronary/Graft Acute MI Revascularization;  Surgeon: Marykay Lex, MD;  Location: [facility]  INVASIVE CV LAB;  Service: Cardiovascular;  Laterality: N/A;   SALPINGOOPHORECTOMY  04/06/2012   Procedure: SALPINGO OOPHORECTOMY;  Surgeon: [name], MD;  Location: [facility];  Service: Gynecology;  Laterality: Bilateral;   TUBAL LIGATION      No Known Allergies  Outpatient Encounter Medications as of 12/22/2022  Medication Sig   amLODipine (NORVASC) 10 MG tablet Take 1 tablet (10 mg total) by mouth daily.   aspirin EC 81 MG tablet Take 1 tablet (81 mg total) by mouth daily. Swallow whole.   carvedilol (COREG) 6.25 MG tablet Take 1 tablet (6.25 mg total) by mouth 2 (two) times daily with a meal.   clopidogrel (PLAVIX) 75 MG tablet Take 1 tablet (75 mg total) by mouth daily with breakfast.   NARCAN 4 MG/0.1ML LIQD nasal spray kit    nitroGLYCERIN (NITROSTAT) 0.4 MG SL tablet Place 1 tablet (0.4 mg total) under the tongue every 5 (five) minutes x 3 doses as needed for chest pain.   rivaroxaban (XARELTO) 10 MG TABS tablet Take 1 tablet (10 mg total) by mouth daily.   rosuvastatin (CRESTOR) 20 MG tablet Take 1 tablet (20 mg total) by mouth daily.   tiZANidine (ZANAFLEX) 2 MG tablet Take 1 tablet (2 mg total) by mouth every 8 (eight) hours as needed for muscle spasms.   Vitamin D, Ergocalciferol, (DRISDOL) 1.25 MG (50000 UNIT) CAPS capsule Take 1 capsule (50,000 Units total) by mouth every 7 (seven) days.   No  facility-administered encounter medications on file as of 12/22/2022.    Review of Systems  Constitutional:  Negative for appetite change, chills, fatigue, fever and unexpected weight change.  Respiratory:  Negative for cough, chest tightness, shortness of breath and wheezing.   Cardiovascular:  Positive for leg swelling. Negative for chest pain and palpitations.  Gastrointestinal:  Negative for abdominal distention, abdominal pain, nausea and vomiting.  Genitourinary:  Negative for difficulty urinating, dysuria, flank pain, frequency and urgency.  Musculoskeletal:  Negative for  arthralgias, back pain, gait problem, joint swelling and myalgias.  Skin:  Negative for color change, pallor, rash and wound.     There is no immunization history on file for this patient. Pertinent  Health Maintenance Due  Topic Date Due   INFLUENZA VACCINE  Never done   MAMMOGRAM  03/23/2024   Colonoscopy  02/26/2032      02/24/2019    3:22 PM 03/30/2022   11:40 AM 05/20/2022    2:33 PM 06/17/2022    2:22 PM 12/22/2022    2:36 PM  Fall Risk  Falls in the past year? 0 1 0 0 0  Was there an injury with Fall? 0 0 0 0 0  Fall Risk Category Calculator 0 1 0 1 0  Fall Risk Category (Retired) Low      Patient at Risk for Falls Due to   No Fall Risks No Fall Risks No Fall Risks  Fall risk Follow up   Falls evaluation completed Falls evaluation completed;Education provided;Falls prevention discussed Falls evaluation completed   Functional Status Survey:    Vitals:   12/22/22 1438  BP: 130/82  Pulse: 70  Resp: 20  Temp: 98.2 F (36.8 C)  SpO2: 98%  Weight: 184 lb 3.2 oz (83.6 kg)  Height: 5\' 6"  (1.676 m)   Body mass index is 29.73 kg/m. Physical Exam Vitals reviewed.  Constitutional:      General: She is not in acute distress.    Appearance: Normal appearance. She is overweight. She is not ill-appearing or diaphoretic.  HENT:     Head: Normocephalic.     Mouth/Throat:     Mouth: Mucous membranes are moist.     Pharynx: Oropharynx is clear. No oropharyngeal exudate or posterior oropharyngeal erythema.  Eyes:     General: No scleral icterus.       Right eye: No discharge.        Left eye: No discharge.     Conjunctiva/sclera: Conjunctivae normal.     Pupils: Pupils are equal, round, and reactive to light.  Neck:     Vascular: No carotid bruit.  Cardiovascular:     Rate and Rhythm: Normal rate and regular rhythm.     Pulses: Normal pulses.     Heart sounds: Normal heart sounds. No murmur heard.    No friction rub. No gallop.  Pulmonary:     Effort: Pulmonary  effort is normal. No respiratory distress.     Breath sounds: Normal breath sounds. No wheezing, rhonchi or rales.  Chest:     Chest wall: No tenderness.  Abdominal:     General: Bowel sounds are normal. There is no distension.     Palpations: Abdomen is soft. There is no mass.     Tenderness: There is no abdominal tenderness. There is no right CVA tenderness, left CVA tenderness, guarding or rebound.  Musculoskeletal:        General: No swelling or tenderness. Normal range of motion.     Cervical  back: Normal range of motion. No rigidity or tenderness.     Right lower leg: Edema present.     Left lower leg: Edema present.  Lymphadenopathy:     Cervical: No cervical adenopathy.  Skin:    General: Skin is warm and dry.     Coloration: Skin is not pale.     Findings: No bruising, erythema or rash.  Neurological:     Mental Status: She is alert and oriented to person, place, and time.     Motor: No weakness.     Gait: Gait normal.  Psychiatric:        Mood and Affect: Mood normal.        Speech: Speech normal.        Behavior: Behavior normal.     Labs reviewed: Recent Labs    02/03/22 1348 05/28/22 1138 11/21/22 0646 11/22/22 0507  NA 144 143 143  144 141  K 3.9 4.2 3.4*  3.4* 3.4*  CL 110 110 109 112*  CO2 28 22  --  21*  GLUCOSE 95 86 118* 96  BUN 20 16 12 11   CREATININE 0.95 0.72 0.60 0.79  CALCIUM 9.6 9.5  --  8.4*   Recent Labs    02/03/22 1348 05/28/22 1138  AST 11 12  ALT 11 10  BILITOT 0.3 0.5  PROT 6.7 6.7   Recent Labs    02/03/22 1348 05/28/22 1138 11/21/22 0646 11/22/22 0507  WBC 6.0 4.0  --  5.8  NEUTROABS 4,212 2,092  --   --   HGB 12.9 14.0 13.6  13.6 11.8*  HCT 40.1 43.3 40.0  40.0 36.4  MCV 89.9 90.0  --  93.3  PLT 309 247  --  208   Lab Results  Component Value Date   TSH 1.796 11/21/2022   Lab Results  Component Value Date   HGBA1C 5.7 (H) 11/21/2022   Lab Results  Component Value Date   CHOL 149 11/22/2022   HDL 24  (L) 11/22/2022   LDLCALC 112 (H) 11/22/2022   TRIG 63 11/22/2022   CHOLHDL 6.2 11/22/2022    Significant Diagnostic Results in last 30 days:  No results found.  Assessment/Plan 1. Edema of both lower extremities Has had a 3 lbs weight gain.No signs of fluid overload  -Start on hydrochlorothiazide 12.5 mg tablet daily -Will follow-up in 2 weeks then recheck BMP - Compression stockings  2. Chronic bilateral low back pain with bilateral sciatica Chronic but seems to be worsening -Recommend referral to pain management clinic for further evaluation - Ambulatory referral to Pain Clinic   Family/ staff Communication: Reviewed plan of care with patient verbalized understanding  Labs/tests ordered: None   Next Appointment: Return in about 2 weeks (around 01/05/2023) for bilateral lower extremities .   Caesar Bookman, NP

## 2022-12-23 ENCOUNTER — Encounter (HOSPITAL_COMMUNITY): Payer: Medicare HMO

## 2022-12-24 ENCOUNTER — Other Ambulatory Visit: Payer: Self-pay

## 2022-12-25 ENCOUNTER — Encounter (HOSPITAL_COMMUNITY)
Admission: RE | Admit: 2022-12-25 | Discharge: 2022-12-25 | Disposition: A | Discharge status: Home / Self Care | Payer: Medicare HMO | Source: Ambulatory Visit | Attending: Cardiology | Admitting: Cardiology

## 2022-12-25 DIAGNOSIS — Z955 Presence of coronary angioplasty implant and graft: Secondary | ICD-10-CM

## 2022-12-25 DIAGNOSIS — I252 Old myocardial infarction: Secondary | ICD-10-CM | POA: Diagnosis not present

## 2022-12-25 DIAGNOSIS — I2121 ST elevation (STEMI) myocardial infarction involving left circumflex coronary artery: Secondary | ICD-10-CM

## 2022-12-25 NOTE — Progress Notes (Signed)
Daily Session Note  Patient Details  Name: [name] MRN: [id number] Date of Birth: 01-19-1961 Referring Provider:   Flowsheet Row INTENSIVE CARDIAC REHAB ORIENT from 12/18/2022 in [facility]  Referring Provider Bryan Lemma, MD       Encounter Date: 12/25/2022  Check In:  Session Check In - 12/25/22 1411       Check-In   Supervising physician immediately available to respond to emergencies [facility] - Physician supervision    Physician(s) Jari Favre, PA    Location [facility]    Staff Present Valinda Party, MS, Exercise Physiologist;Jetta Dan Humphreys BS, ACSM-CEP, Exercise Physiologist;David Makemson, MS, ACSM-CEP, CCRP, Exercise Physiologist;Tray Klayman, RN, Zachery Conch, MS, ACSM-CEP, Exercise Physiologist;Johnny Hale Bogus, MS, Exercise Physiologist    Virtual Visit No    Medication changes reported     No    Fall or balance concerns reported    No    Tobacco Cessation No Change    Warm-up and Cool-down Performed as group-led instruction    Resistance Training Performed Yes    VAD Patient? No    PAD/SET Patient? No      Pain Assessment   Currently in Pain? No/denies    Pain Score 0-No pain    Multiple Pain Sites No             Capillary Blood Glucose: No results found for this or any previous visit (from the past 24 hour(s)).   Exercise Prescription Changes - 12/25/22 1632       Response to Exercise   Blood Pressure (Admit) 120/68    Blood Pressure (Exercise) 108/56    Blood Pressure (Exit) 122/80    Heart Rate (Admit) 66 bpm    Heart Rate (Exercise) 86 bpm    Heart Rate (Exit) 64 bpm    Rating of Perceived Exertion (Exercise) 11    Perceived Dyspnea (Exercise) 0    Symptoms 0    Comments Pt first day in the [facility] program    Duration Progress to 30 minutes of  aerobic without signs/symptoms of physical distress    Intensity THRR unchanged      Progression    Progression Continue to progress workloads to maintain intensity without signs/symptoms of physical distress.    Average METs 1.1      Resistance Training   Training Prescription No    Weight 2    Reps 10-15      NuStep   Level 1    SPM 42    Minutes 15    METs 1.1             Social History   Tobacco Use  Smoking Status Former   Current packs/day: 0.00   Average packs/day: 0.3 packs/day for 38.0 years (9.5 ttl pk-yrs)   Types: Cigarettes   Quit date: 11/21/2022   Years since quitting: 0.0  Smokeless Tobacco Never  Tobacco Comments   3 per day.     Goals Met:  Exercise tolerated well Strength training completed today  Goals Unmet:  Not Applicable  Comments: Pt started cardiac rehab today.  Pt tolerated light exercise without difficulty. VSS, telemetry-Sinus Rhythm, asymptomatic.  Medication list reconciled. Pt denies barriers to medicaiton compliance.  PSYCHOSOCIAL ASSESSMENT:  PHQ-15. Pt denies being depressed currently.   Pt enjoys dancing, cooking and driving her friends. Will review quality of life in the upcoming week . Pt oriented to exercise equipment and routine.  Understanding verbalized. Patient is deconditioned. Uses a cane for mobility.Thayer Headings RN BSN

## 2022-12-28 ENCOUNTER — Encounter (HOSPITAL_COMMUNITY): Payer: Medicare HMO

## 2022-12-28 NOTE — Progress Notes (Signed)
Cardiac Individual Treatment Plan  Patient Details  Name: [name] MRN: [id number] Date of Birth: 1960-08-13 Referring Provider:   Flowsheet Row INTENSIVE CARDIAC REHAB ORIENT from 12/18/2022 in [facility]  Referring Provider Bryan Lemma, MD       Initial Encounter Date:  Flowsheet Row INTENSIVE CARDIAC REHAB ORIENT from 12/18/2022 in [facility] Heart, Vascular, [facility] Lung [facility]  Date 12/18/22       Visit Diagnosis: 11/21/22 STEMI  11/21/22 DES LCx  Patient's Home Medications on Admission:  Current Outpatient Medications:    aspirin EC 81 MG tablet, Take 1 tablet (81 mg total) by mouth daily. Swallow whole., Disp: 90 tablet, Rfl: 3   carvedilol (COREG) 6.25 MG tablet, Take 1 tablet (6.25 mg total) by mouth 2 (two) times daily with a meal., Disp: 180 tablet, Rfl: 3   clopidogrel (PLAVIX) 75 MG tablet, Take 1 tablet (75 mg total) by mouth daily with breakfast., Disp: 90 tablet, Rfl: 3   hydrochlorothiazide (HYDRODIURIL) 12.5 MG tablet, Take 1 tablet (12.5 mg total) by mouth daily., Disp: 90 tablet, Rfl: 3   NARCAN 4 MG/0.1ML LIQD nasal spray kit, , Disp: , Rfl:    nitroGLYCERIN (NITROSTAT) 0.4 MG SL tablet, Place 1 tablet (0.4 mg total) under the tongue every 5 (five) minutes x 3 doses as needed for chest pain., Disp: 25 tablet, Rfl: 2   rivaroxaban (XARELTO) 10 MG TABS tablet, Take 1 tablet (10 mg total) by mouth daily., Disp: 30 tablet, Rfl: 3   rosuvastatin (CRESTOR) 20 MG tablet, Take 1 tablet (20 mg total) by mouth daily., Disp: 90 tablet, Rfl: 3   tiZANidine (ZANAFLEX) 2 MG tablet, Take 1 tablet (2 mg total) by mouth every 8 (eight) hours as needed for muscle spasms., Disp: 90 tablet, Rfl: 3   Vitamin D, Ergocalciferol, (DRISDOL) 1.25 MG (50000 UNIT) CAPS capsule, Take 1 capsule (50,000 Units total) by mouth every 7 (seven) days., Disp: 5 capsule, Rfl: 3  Past Medical History: Past  Medical History:  Diagnosis Date   DVT (deep venous thrombosis) (HCC)    Headache(784.0)    otc meds prn    Heart attack (HCC) 11/22/2022   History of hysterectomy 2014   Hyperlipidemia    Hypertension 09/2018   Plantar warts 08/2019   SVD (spontaneous vaginal delivery)    x 5   Vitamin D deficiency 09/2018    Tobacco Use: Social History   Tobacco Use  Smoking Status Former   Current packs/day: 0.00   Average packs/day: 0.3 packs/day for 38.0 years (9.5 ttl pk-yrs)   Types: Cigarettes   Quit date: 11/21/2022   Years since quitting: 0.1  Smokeless Tobacco Never  Tobacco Comments   3 per day.     Labs: Review Flowsheet  More data exists      Latest Ref Rng & Units 09/14/2018 02/03/2022 05/28/2022 11/21/2022 11/22/2022  Labs for ITP Cardiac and Pulmonary Rehab  Cholestrol 0 - 200 mg/dL 409  811  914  - 782   LDL (calc) 0 - 99 mg/dL 956  213  086  - 578   HDL-C >40 mg/dL 42  45  42  - 24   Trlycerides <150 mg/dL 75  469  67  - 63   Hemoglobin A1c 4.8 - 5.6 % 5.4  5.7  - 5.7  -  PH, Arterial 7.35 - 7.45 - - - 7.357  -  PCO2 arterial  32 - 48 mmHg - - - 36.7  -  Bicarbonate 20.0 - 28.0 mmol/L - - - 20.6  -  TCO2 22 - 32 mmol/L 22 - 32 mmol/L - - - 20  22  -  Acid-base deficit 0.0 - 2.0 mmol/L - - - 4.0  -  O2 Saturation % - - - 91  -    Details       Multiple values from one day are sorted in reverse-chronological order         Capillary Blood Glucose: No results found for: "GLUCAP"   Exercise Target Goals: Exercise Program Goal: Individual exercise prescription set using results from initial 6 min walk test and THRR while considering  patient's activity barriers and safety.   Exercise Prescription Goal: Initial exercise prescription builds to 30-45 minutes a day of aerobic activity, 2-3 days per week.  Home exercise guidelines will be given to patient during program as part of exercise prescription that the participant will acknowledge.  Activity Barriers &  Risk Stratification:  Activity Barriers & Cardiac Risk Stratification - 12/18/22 1106       Activity Barriers & Cardiac Risk Stratification   Activity Barriers Joint Problems;Deconditioning;Balance Concerns;History of Falls;Back Problems;Assistive Device    Cardiac Risk Stratification High   <5 METs on            6 Minute Walk:  6 Minute Walk     Row Name 12/18/22 1207         6 Minute Walk   Phase Initial     Distance 720 feet     Walk Time 6 minutes     # of Rest Breaks 0     MPH 1.36     METS 1.85     RPE 13     Perceived Dyspnea  2     VO2 Peak 6.49     Symptoms Yes (comment)     Comments SOB, resolved with rest. 8/10 bilateral calf pain, resolved with rest.     Resting HR 64 bpm     Resting BP 120/74     Resting Oxygen Saturation  100 %     Exercise Oxygen Saturation  during 6 min walk 100 %     Max Ex. HR 70 bpm     Max Ex. BP 124/60     2 Minute Post BP 110/62              Oxygen Initial Assessment:   Oxygen Re-Evaluation:   Oxygen Discharge (Final Oxygen Re-Evaluation):   Initial Exercise Prescription:  Initial Exercise Prescription - 12/18/22 1100       Date of Initial Exercise RX and Referring Provider   Date 12/18/22    Referring Provider Bryan Lemma, MD    Expected Discharge Date 03/08/23      NuStep   Level 1    SPM 60    Minutes 15    METs 2      Prescription Details   Frequency (times per week) 3    Duration Progress to 30 minutes of continuous aerobic without signs/symptoms of physical distress      Intensity   THRR 40-80% of Max Heartrate 63-126    Ratings of Perceived Exertion 11-13    Perceived Dyspnea 0-4      Progression   Progression Continue progressive overload as per policy without signs/symptoms or physical distress.      Resistance Training   Training Prescription Yes  Weight 2    Reps 10-15             Perform Capillary Blood Glucose checks as needed.  Exercise Prescription  Changes:   Exercise Prescription Changes     Row Name 12/25/22 1632             Response to Exercise   Blood Pressure (Admit) 120/68       Blood Pressure (Exercise) 108/56       Blood Pressure (Exit) 122/80       Heart Rate (Admit) 66 bpm       Heart Rate (Exercise) 86 bpm       Heart Rate (Exit) 64 bpm       Rating of Perceived Exertion (Exercise) 11       Perceived Dyspnea (Exercise) 0       Symptoms 0       Comments Pt first day in the Prit[facility]in ICR program       Duration Progress to 30 minutes of  aerobic without signs/symptoms of physical distress       Intensity THRR unchanged         Progression   Progression Continue to progress workloads to maintain intensity without signs/symptoms of physical distress.       Average METs 1.1         Resistance Training   Training Prescription No       Weight 2       Reps 10-15         NuStep   Level 1       SPM 42       Minutes 15       METs 1.1                Exercise Comments:   Exercise Comments     Row Name 12/25/22 1637           Exercise Comments Pt first day in the Prit[facility]in iCR program. Pt tolerated low intensity exercise well with an average MET level of 1.1. Pt is learning her THRR, RPE and ExRx                Exercise Goals and Review:   Exercise Goals     Row Name 12/18/22 1011             Exercise Goals   Increase Physical Activity Yes       Intervention Provide advice, education, support and counseling about physical activity/exercise needs.;Develop an individualized exercise prescription for aerobic and resistive training based on initial evaluation findings, risk stratification, comorbidities and participant's personal goals.       Expected Outcomes Short Term: Attend rehab on a regular basis to increase amount of physical activity.;Long Term: Exercising regularly at least 3-5 days a week.;Long Term: Add in home exercise to make exercise part of routine and to increase amount of  physical activity.       Increase Strength and Stamina Yes       Intervention Provide advice, education, support and counseling about physical activity/exercise needs.;Develop an individualized exercise prescription for aerobic and resistive training based on initial evaluation findings, risk stratification, comorbidities and participant's personal goals.       Expected Outcomes Short Term: Increase workloads from initial exercise prescription for resistance, speed, and METs.;Short Term: Perform resistance training exercises routinely during rehab and add in resistance training at home;Long Term: Improve cardiorespiratory fitness, muscular endurance and strength as measured by increased METs and  functional capacity ( )       Able to understand and use rate of perceived exertion (RPE) scale Yes       Intervention Provide education and explanation on how to use RPE scale       Expected Outcomes Short Term: Able to use RPE daily in rehab to express subjective intensity level;Long Term:  Able to use RPE to guide intensity level when exercising independently       Knowledge and understanding of Target Heart Rate Range (THRR) Yes       Intervention Provide education and explanation of THRR including how the numbers were predicted and where they are located for reference       Expected Outcomes Short Term: Able to state/look up THRR;Short Term: Able to use daily as guideline for intensity in rehab;Long Term: Able to use THRR to govern intensity when exercising independently       Understanding of Exercise Prescription Yes       Intervention Provide education, explanation, and written materials on patient's individual exercise prescription       Expected Outcomes Short Term: Able to explain program exercise prescription;Long Term: Able to explain home exercise prescription to exercise independently                Exercise Goals Re-Evaluation :  Exercise Goals Re-Evaluation     Row Name 12/25/22  1635             Exercise Goal Re-Evaluation   Exercise Goals Review Increase Physical Activity;Understanding of Exercise Prescription;Increase Strength and Stamina;Knowledge and understanding of Target Heart Rate Range (THRR);Able to understand and use rate of perceived exertion (RPE) scale       Comments Pt first day in the Prit[facility]in iCR program. Pt tolerated low intensity exercise well with an average MET level of 1.1. Pt is learning her THRR, RPE and ExRx       Expected Outcomes Will continue to monitor pt and progress workloads as tolerated without sign or symptom                Discharge Exercise Prescription (Final Exercise Prescription Changes):  Exercise Prescription Changes - 12/25/22 1632       Response to Exercise   Blood Pressure (Admit) 120/68    Blood Pressure (Exercise) 108/56    Blood Pressure (Exit) 122/80    Heart Rate (Admit) 66 bpm    Heart Rate (Exercise) 86 bpm    Heart Rate (Exit) 64 bpm    Rating of Perceived Exertion (Exercise) 11    Perceived Dyspnea (Exercise) 0    Symptoms 0    Comments Pt first day in the [facility] ICR program    Duration Progress to 30 minutes of  aerobic without signs/symptoms of physical distress    Intensity THRR unchanged      Progression   Progression Continue to progress workloads to maintain intensity without signs/symptoms of physical distress.    Average METs 1.1      Resistance Training   Training Prescription No    Weight 2    Reps 10-15      NuStep   Level 1    SPM 42    Minutes 15    METs 1.1             Nutrition:  Target Goals: Understanding of nutrition guidelines, daily intake of sodium 1500mg , cholesterol 200mg , calories 30% from fat and 7% or less from saturated fats, daily to have 5 or more servings  of fruits and vegetables.  Biometrics:  Pre Biometrics - 12/18/22 1025       Pre Biometrics   Waist Circumference 43 inches    Hip Circumference 47 inches    Waist to Hip Ratio 0.91 %     Triceps Skinfold 46 mm    % Body Fat 44.6 %    Grip Strength 12 kg    Flexibility --   not done due to low back pain   Single Leg Stand --   not done, pt uses cane             Nutrition Therapy Plan and Nutrition Goals:  Nutrition Therapy & Goals - 12/25/22 1529       Nutrition Therapy   Diet Heart healthy diet    Drug/Food Interactions Statins/Certain Fruits      Personal Nutrition Goals   Nutrition Goal Patient to identify strategies for reducing cardiovascular risk by attending the [facility] education and nutrition series weekly.    Personal Goal #2 Patient to improve diet quality by using the plate method as a guide for meal planning to include lean protein/plant protein, fruits, vegetables, whole grains, nonfat dairy as part of a well-balanced diet.    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 Her LDL remains above goal and A1c is in a pre-diabetic range. Patient will benefit from participation in intensive cardiac rehab for nutrition, exercise, and lifestyle modification.      Intervention Plan   Intervention Prescribe, educate and counsel regarding individualized specific dietary modifications aiming towards targeted core components such as weight, hypertension, lipid management, diabetes, heart failure and other comorbidities.;Nutrition handout(s) given to patient.    Expected Outcomes Short Term Goal: Understand basic principles of dietary content, such as calories, fat, sodium, cholesterol and nutrients.;Long Term Goal: Adherence to prescribed nutrition plan.             Nutrition Assessments:  MEDIFICTS Score Key: >=70 Need to make dietary changes  40-70 Heart Healthy Diet <= 40 Therapeutic Level Cholesterol Diet    Picture Your Plate Scores: <40 Unhealthy dietary pattern with much room for improvement. 41-50 Dietary pattern unlikely to meet recommendations for good health and  room for improvement. 51-60 More healthful dietary pattern, with some room for improvement.  >60 Healthy dietary pattern, although there may be some specific behaviors that could be improved.    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 Her LDL remains above goal and A1c is in a pre-diabetic range. Patient will benefit from participation in intensive cardiac rehab for nutrition, exercise, and lifestyle modification.                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 Her LDL remains above goal and A1c is in a pre-diabetic range. Patient will benefit from participation in intensive cardiac rehab for nutrition, exercise, and lifestyle modification.                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 Her LDL remains above goal and A1c is in a pre-diabetic range. Patient will benefit from participation in intensive cardiac rehab for nutrition, exercise, and lifestyle modification.             Psychosocial: Target Goals: Acknowledge presence or absence of significant depression and/or stress, maximize coping skills, provide positive support system. Participant is able to verbalize types and ability to use techniques and  skills needed for reducing stress and depression.  Initial Review & Psychosocial Screening:  Initial Psych Review & Screening - 12/18/22 1029       Initial Review   Current issues with Current Depression;Current Stress Concerns    Source of Stress Concerns Family    Comments [name] shared that she has had some feelings of depression since losing her son and husband 3 years ago, along with other familial stress. She has a large family and a lot of support, however is still grieving her recent losses. [name] is not in counseling or on medication, however she is interested in receiving additional resources. Will discuss with RN to encourage [name] to schedule with PCP.      Family Dynamics   Good Support System? Yes   [name] has her daughters for support     Barriers   Psychosocial barriers to participate in program The patient should benefit from training in stress management and relaxation.      Screening Interventions   Interventions To provide support and resources with identified psychosocial needs;Provide feedback about the scores to participant;Encouraged to exercise    Expected Outcomes Short Term goal: Utilizing psychosocial counselor, staff and physician to assist with identification of specific Stressors or current issues interfering with healing process. Setting desired goal for each stressor or current issue identified.;Long Term Goal: Stressors or current issues are controlled or eliminated.;Short Term goal: Identification and review with participant of any Quality of Life or Depression concerns found by scoring the questionnaire.;Long Term goal: The participant improves quality of Life and PHQ9 Scores as seen by post scores and/or verbalization of changes             Quality of Life Scores:  Quality of Life - 12/18/22 1106       Quality of Life   Select Quality of Life      Quality of Life Scores   Health/Function Pre 20.8 %    Socioeconomic Pre 19.5 %     Psych/Spiritual Pre 24.86 %    Family Pre 28.8 %    GLOBAL Pre 22.54 %            Scores of 19 and below usually indicate a poorer quality of life in these areas.  A difference of  2-3 points is a clinically meaningful difference.  A difference of 2-3 points in the total score of the Quality of Life Index has been associated with significant improvement  in overall quality of life, self-image, physical symptoms, and general health in studies assessing change in quality of life.  PHQ-9: Review Flowsheet  More data exists      12/22/2022 12/18/2022 06/17/2022 05/20/2022 03/30/2022  Depression screen PHQ 2/9  Decreased Interest 0 2 0 0 2  Down, Depressed, Hopeless 0 0 0 0 1  PHQ - 2 Score 0 2 0 0 3  Altered sleeping 0 3 - - 3  Tired, decreased energy 0 2 - - 3  Change in appetite 0 2 - - 0  Feeling bad or failure about yourself  0 0 - - 0  Trouble concentrating 0 0 - - 0  Moving slowly or fidgety/restless 0 2 - - 3  Suicidal thoughts 0 0 - - 0  PHQ-9 Score 0 11 - - 12  Difficult doing work/chores Not difficult at all Somewhat difficult - - Not difficult at all    Details           Interpretation of Total Score  Total Score Depression Severity:  1-4 = Minimal depression, 5-9 = Mild depression, 10-14 = Moderate depression, 15-19 = Moderately severe depression, 20-27 = Severe depression   Psychosocial Evaluation and Intervention:   Psychosocial Re-Evaluation:  Psychosocial Re-Evaluation     Row Name 12/28/22 0919             Psychosocial Re-Evaluation   Current issues with Current Stress Concerns;Current Depression       Comments Will review quality of life and PHQ2-9. Will discuss counselling options as [name] has not discussed her depression with her PCP       Expected Outcomes [name] will have controlled or decreased depression upon compleiton of cardiac rehab.       Interventions Stress management education;Relaxation education;Encouraged to attend Cardiac  Rehabilitation for the exercise       Continue Psychosocial Services  Follow up required by staff         Initial Review   Source of Stress Concerns Chronic Illness;Family       Comments Will continue to monitor and offer support as needed                Psychosocial Discharge (Final Psychosocial Re-Evaluation):  Psychosocial Re-Evaluation - 12/28/22 0919       Psychosocial Re-Evaluation   Current issues with Current Stress Concerns;Current Depression    Comments Will review quality of life and PHQ2-9. Will discuss counselling options as [name] has not discussed her depression with her PCP    Expected Outcomes Eriqa will have controlled or decreased depression upon compleiton of cardiac rehab.    Interventions Stress management education;Relaxation education;Encouraged to attend Cardiac Rehabilitation for the exercise    Continue Psychosocial Services  Follow up required by staff      Initial Review   Source of Stress Concerns Chronic Illness;Family    Comments Will continue to monitor and offer support as needed             Vocational Rehabilitation: Provide vocational rehab assistance to qualifying candidates.   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 Participant will state understanding/return demonstration of topics presented.  Education     Row Name 12/25/22 1500     Education   Cardiac Education Topics [facility]ritikin   Licensed conveyancer Nutrition   Nutrition Vitamins and Minerals   Instruction Review Code 1- Verbalizes Understanding   Class Start Time 1400   Class Stop Time 1435   Class Time Calculation  (min) 35 min            Core Videos: Exercise    Move It!  Clinical staff conducted group or individual video education with verbal and written material and guidebook.  Patient learns the recommended Pritikin exercise program. Exercise with the goal of living a long, healthy life. Some of the health benefits of exercise include controlled diabetes, healthier blood pressure levels, improved cholesterol levels, improved heart and lung capacity, improved sleep, and better body composition. Everyone should speak with their doctor before starting or changing an exercise routine.  Biomechanical Limitations Clinical staff conducted group or individual video education with verbal and written material and guidebook.  Patient learns how biomechanical limitations can impact exercise and how we can mitigate and possibly overcome limitations to have an impactful and balanced exercise routine.  Body Composition Clinical staff conducted group or individual video education with verbal and written material and guidebook.  Patient learns that body composition (ratio of muscle mass to fat mass) is a key component to assessing overall fitness, rather than body weight alone. Increased fat mass, especially visceral belly fat, can put [place] at increased risk for metabolic syndrome, type 2 diabetes, heart disease, and even death. It is recommended to combine diet and exercise (cardiovascular and resistance training) to improve your body composition. Seek guidance from your physician and exercise physiologist before implementing an exercise routine.  Exercise Action Plan Clinical staff conducted group or individual video education with verbal and written material and guidebook.  Patient learns the recommended strategies to achieve and enjoy long-term exercise adherence, including variety, self-motivation, self-efficacy, and positive decision making. Benefits of exercise include fitness, good health, weight management,  more energy, better sleep, less stress, and overall well-being.  Medical   Heart Disease Risk Reduction Clinical staff conducted group or individual video education with verbal and written material and guidebook.  Patient learns our heart is our most vital organ as it circulates oxygen, nutrients, white blood cells, and hormones throughout the entire body, and carries waste away. Data supports a plant-based eating plan like the Pritikin Program for its effectiveness in slowing progression of and reversing heart disease. The video provides a number of recommendations to address heart disease.   Metabolic Syndrome and Belly Fat  Clinical staff conducted group or individual video education with verbal and written material and guidebook.  Patient learns what metabolic syndrome is, how it leads to heart disease, and how one can reverse it and keep it from coming back. You have metabolic syndrome if you have 3 of the following 5 criteria: abdominal obesity, high blood pressure, high triglycerides, low HDL cholesterol, and high blood sugar.  Hypertension and Heart Disease Clinical staff conducted group or individual video education with verbal and written material and guidebook.  Patient learns that high blood pressure, or hypertension, is very common in the [place]. Hypertension is largely due to excessive salt intake, but other important risk factors include being overweight, physical inactivity, drinking too much alcohol, smoking, and not eating enough potassium from fruits and vegetables. High blood pressure is a  leading risk factor for heart attack, stroke, congestive heart failure, dementia, kidney failure, and premature death. Long-term effects of excessive salt intake include stiffening of the arteries and thickening of heart muscle and organ damage. Recommendations include ways to reduce hypertension and the risk of heart disease.  Diseases of Our Time - Focusing on Diabetes Clinical staff  conducted group or individual video education with verbal and written material and guidebook.  Patient learns why the best way to stop diseases of our time is prevention, through food and other lifestyle changes. Medicine (such as prescription pills and surgeries) is often only a Band-Aid on the problem, not a long-term solution. Most common diseases of our time include obesity, type 2 diabetes, hypertension, heart disease, and cancer. The Pritikin Program is recommended and has been proven to help reduce, reverse, and/or prevent the damaging effects of metabolic syndrome.  Nutrition   Overview of the Pritikin Eating Plan  Clinical staff conducted group or individual video education with verbal and written material and guidebook.  Patient learns about the Pritikin Eating Plan for disease risk reduction. The Pritikin Eating Plan emphasizes a wide variety of unrefined, minimally-processed carbohydrates, like fruits, vegetables, whole grains, and legumes. Go, Caution, and Stop food choices are explained. Plant-based and lean animal proteins are emphasized. Rationale provided for low sodium intake for blood pressure control, low added sugars for blood sugar stabilization, and low added fats and oils for coronary artery disease risk reduction and weight management.  Calorie Density  Clinical staff conducted group or individual video education with verbal and written material and guidebook.  Patient learns about calorie density and how it impacts the Pritikin Eating Plan. Knowing the characteristics of the food you choose will help you decide whether those foods will lead to weight gain or weight loss, and whether you want to consume more or less of them. Weight loss is usually a side effect of the Pritikin Eating Plan because of its focus on low calorie-dense foods.  Label Reading  Clinical staff conducted group or individual video education with verbal and written material and guidebook.  Patient learns  about the Pritikin recommended label reading guidelines and corresponding recommendations regarding calorie density, added sugars, sodium content, and whole grains.  Dining Out - Part 1  Clinical staff conducted group or individual video education with verbal and written material and guidebook.  Patient learns that restaurant meals can be sabotaging because they can be so high in calories, fat, sodium, and/or sugar. Patient learns recommended strategies on how to positively address this and avoid unhealthy pitfalls.  Facts on Fats  Clinical staff conducted group or individual video education with verbal and written material and guidebook.  Patient learns that lifestyle modifications can be just as effective, if not more so, as many medications for lowering your risk of heart disease. A Pritikin lifestyle can help to reduce your risk of inflammation and atherosclerosis (cholesterol build-up, or plaque, in the artery walls). Lifestyle interventions such as dietary choices and physical activity address the cause of atherosclerosis. A review of the types of fats and their impact on blood cholesterol levels, along with dietary recommendations to reduce fat intake is also included.  Nutrition Action Plan  Clinical staff conducted group or individual video education with verbal and written material and guidebook.  Patient learns how to incorporate Pritikin recommendations into their lifestyle. Recommendations include planning and keeping personal health goals in mind as an important part of their success.  Healthy Mind-Set    Healthy  Minds, Bodies, Hearts  Clinical staff conducted group or individual video education with verbal and written material and guidebook.  Patient learns how to identify when they are stressed. Video will discuss the impact of that stress, as well as the many benefits of stress management. Patient will also be introduced to stress management techniques. The way we think, act, and  feel has an impact on our hearts.  How Our Thoughts Can Heal Our Hearts  Clinical staff conducted group or individual video education with verbal and written material and guidebook.  Patient learns that negative thoughts can cause depression and anxiety. This can result in negative lifestyle behavior and serious health problems. Cognitive behavioral therapy is an effective method to help control our thoughts in order to change and improve our emotional outlook.  Additional Videos:  Exercise    Improving Performance  Clinical staff conducted group or individual video education with verbal and written material and guidebook.  Patient learns to use a non-linear approach by alternating intensity levels and lengths of time spent exercising to help burn more calories and lose more body fat. Cardiovascular exercise helps improve heart health, metabolism, hormonal balance, blood sugar control, and recovery from fatigue. Resistance training improves strength, endurance, balance, coordination, reaction time, metabolism, and muscle mass. Flexibility exercise improves circulation, posture, and balance. Seek guidance from your physician and exercise physiologist before implementing an exercise routine and learn your capabilities and proper form for all exercise.  Introduction to Yoga  Clinical staff conducted group or individual video education with verbal and written material and guidebook.  Patient learns about yoga, a discipline of the coming together of mind, breath, and body. The benefits of yoga include improved flexibility, improved range of motion, better posture and core strength, increased lung function, weight loss, and positive self-image. Yoga's heart health benefits include lowered blood pressure, healthier heart rate, decreased cholesterol and triglyceride levels, improved immune function, and reduced stress. Seek guidance from your physician and exercise physiologist before implementing an exercise  routine and learn your capabilities and proper form for all exercise.  Medical   Aging: Enhancing Your Quality of Life  Clinical staff conducted group or individual video education with verbal and written material and guidebook.  Patient learns key strategies and recommendations to stay in good physical health and enhance quality of life, such as prevention strategies, having an advocate, securing a Health Care Proxy and Power of Attorney, and keeping a list of medications and system for tracking them. It also discusses how to avoid risk for bone loss.  Biology of Weight Control  Clinical staff conducted group or individual video education with verbal and written material and guidebook.  Patient learns that weight gain occurs because we consume more calories than we burn (eating more, moving less). Even if your body weight is normal, you may have higher ratios of fat compared to muscle mass. Too much body fat puts you at increased risk for cardiovascular disease, heart attack, stroke, type 2 diabetes, and obesity-related cancers. In addition to exercise, following the Pritikin Eating Plan can help reduce your risk.  Decoding Lab Results  Clinical staff conducted group or individual video education with verbal and written material and guidebook.  Patient learns that lab test reflects one measurement whose values change over time and are influenced by many factors, including medication, stress, sleep, exercise, food, hydration, pre-existing medical conditions, and more. It is recommended to use the knowledge from this video to become more involved with your lab results and  evaluate your numbers to speak with your doctor.   Diseases of Our Time - Overview  Clinical staff conducted group or individual video education with verbal and written material and guidebook.  Patient learns that according to the CDC, 50% to 70% of chronic diseases (such as obesity, type 2 diabetes, elevated lipids, hypertension,  and heart disease) are avoidable through lifestyle improvements including healthier food choices, listening to satiety cues, and increased physical activity.  Sleep Disorders Clinical staff conducted group or individual video education with verbal and written material and guidebook.  Patient learns how good quality and duration of sleep are important to overall health and well-being. Patient also learns about sleep disorders and how they impact health along with recommendations to address them, including discussing with a physician.  Nutrition  Dining Out - Part 2 Clinical staff conducted group or individual video education with verbal and written material and guidebook.  Patient learns how to plan ahead and communicate in order to maximize their dining experience in a healthy and nutritious manner. Included are recommended food choices based on the type of restaurant the patient is visiting.   Fueling a Banker conducted group or individual video education with verbal and written material and guidebook.  There is a strong connection between our food choices and our health. Diseases like obesity and type 2 diabetes are very prevalent and are in large-part due to lifestyle choices. The Pritikin Eating Plan provides plenty of food and hunger-curbing satisfaction. It is easy to follow, affordable, and helps reduce health risks.  Menu Workshop  Clinical staff conducted group or individual video education with verbal and written material and guidebook.  Patient learns that restaurant meals can sabotage health goals because they are often packed with calories, fat, sodium, and sugar. Recommendations include strategies to plan ahead and to communicate with the manager, chef, or server to help order a healthier meal.  Planning Your Eating Strategy  Clinical staff conducted group or individual video education with verbal and written material and guidebook.  Patient learns about the  Pritikin Eating Plan and its benefit of reducing the risk of disease. The Pritikin Eating Plan does not focus on calories. Instead, it emphasizes high-quality, nutrient-rich foods. By knowing the characteristics of the foods, we choose, we can determine their calorie density and make informed decisions.  Targeting Your Nutrition Priorities  Clinical staff conducted group or individual video education with verbal and written material and guidebook.  Patient learns that lifestyle habits have a tremendous impact on disease risk and progression. This video provides eating and physical activity recommendations based on your personal health goals, such as reducing LDL cholesterol, losing weight, preventing or controlling type 2 diabetes, and reducing high blood pressure.  Vitamins and Minerals  Clinical staff conducted group or individual video education with verbal and written material and guidebook.  Patient learns different ways to obtain key vitamins and minerals, including through a recommended healthy diet. It is important to discuss all supplements you take with your doctor.   Healthy Mind-Set    Smoking Cessation  Clinical staff conducted group or individual video education with verbal and written material and guidebook.  Patient learns that cigarette smoking and tobacco addiction pose a serious health risk which affects millions of people. Stopping smoking will significantly reduce the risk of heart disease, lung disease, and many forms of cancer. Recommended strategies for quitting are covered, including working with your doctor to develop a successful plan.  Culinary  Becoming a Set designer conducted group or individual video education with verbal and written material and guidebook.  Patient learns that cooking at home can be healthy, cost-effective, quick, and puts them in control. Keys to cooking healthy recipes will include looking at your recipe, assessing your  equipment needs, planning ahead, making it simple, choosing cost-effective seasonal ingredients, and limiting the use of added fats, salts, and sugars.  Cooking - Breakfast and Snacks  Clinical staff conducted group or individual video education with verbal and written material and guidebook.  Patient learns how important breakfast is to satiety and nutrition through the entire day. Recommendations include key foods to eat during breakfast to help stabilize blood sugar levels and to prevent overeating at meals later in the day. Planning ahead is also a key component.  Cooking - Educational psychologist conducted group or individual video education with verbal and written material and guidebook.  Patient learns eating strategies to improve overall health, including an approach to cook more at home. Recommendations include thinking of animal protein as a side on your plate rather than center stage and focusing instead on lower calorie dense options like vegetables, fruits, whole grains, and plant-based proteins, such as beans. Making sauces in large quantities to freeze for later and leaving the skin on your vegetables are also recommended to maximize your experience.  Cooking - Healthy Salads and Dressing Clinical staff conducted group or individual video education with verbal and written material and guidebook.  Patient learns that vegetables, fruits, whole grains, and legumes are the foundations of the Pritikin Eating Plan. Recommendations include how to incorporate each of these in flavorful and healthy salads, and how to create homemade salad dressings. Proper handling of ingredients is also covered. Cooking - Soups and [employer] - Soups and Desserts Clinical staff conducted group or individual video education with verbal and written material and guidebook.  Patient learns that Pritikin soups and desserts make for easy, nutritious, and delicious snacks and meal components that are  low in sodium, fat, sugar, and calorie density, while high in vitamins, minerals, and filling fiber. Recommendations include simple and healthy ideas for soups and desserts.   Overview     The Pritikin Solution Program Overview Clinical staff conducted group or individual video education with verbal and written material and guidebook.  Patient learns that the results of the Pritikin Program have been documented in more than 100 articles published in peer-reviewed journals, and the benefits include reducing risk factors for (and, in some cases, even reversing) high cholesterol, high blood pressure, type 2 diabetes, obesity, and more! An overview of the three key pillars of the Pritikin Program will be covered: eating well, doing regular exercise, and having a healthy mind-set.  WORKSHOPS  Exercise: Exercise Basics: Building Your Action Plan Clinical staff led group instruction and group discussion with PowerPoint presentation and patient guidebook. To enhance the learning environment the use of posters, models and videos may be added. At the conclusion of this workshop, patients will comprehend the difference between physical activity and exercise, as well as the benefits of incorporating both, into their routine. Patients will understand the FITT (Frequency, Intensity, Time, and Type) principle and how to use it to build an exercise action plan. In addition, safety concerns and other considerations for exercise and cardiac rehab will be addressed by the presenter. The purpose of this lesson is to promote a comprehensive and effective weekly exercise routine in order to improve  patients' overall level of fitness.   Managing Heart Disease: Your Path to a Healthier Heart Clinical staff led group instruction and group discussion with PowerPoint presentation and patient guidebook. To enhance the learning environment the use of posters, models and videos may be added.At the conclusion of this workshop,  patients will understand the anatomy and physiology of the heart. Additionally, they will understand how Pritikin's three pillars impact the risk factors, the progression, and the management of heart disease.  The purpose of this lesson is to provide a high-level overview of the heart, heart disease, and how the Pritikin lifestyle positively impacts risk factors.  Exercise Biomechanics Clinical staff led group instruction and group discussion with PowerPoint presentation and patient guidebook. To enhance the learning environment the use of posters, models and videos may be added. Patients will learn how the structural parts of their bodies function and how these functions impact their daily activities, movement, and exercise. Patients will learn how to promote a neutral spine, learn how to manage pain, and identify ways to improve their physical movement in order to promote healthy living. The purpose of this lesson is to expose patients to common physical limitations that impact physical activity. Participants will learn practical ways to adapt and manage aches and pains, and to minimize their effect on regular exercise. Patients will learn how to maintain good posture while sitting, walking, and lifting.  Balance Training and Fall Prevention  Clinical staff led group instruction and group discussion with PowerPoint presentation and patient guidebook. To enhance the learning environment the use of posters, models and videos may be added. At the conclusion of this workshop, patients will understand the importance of their sensorimotor skills (vision, proprioception, and the vestibular system) in maintaining their ability to balance as they age. Patients will apply a variety of balancing exercises that are appropriate for their current level of function. Patients will understand the common causes for poor balance, possible solutions to these problems, and ways to modify their physical environment  in order to minimize their fall risk. The purpose of this lesson is to teach patients about the importance of maintaining balance as they age and ways to minimize their risk of falling.  WORKSHOPS   Nutrition:  Fueling a Ship broker led group instruction and group discussion with PowerPoint presentation and patient guidebook. To enhance the learning environment the use of posters, models and videos may be added. Patients will review the foundational principles of the Pritikin Eating Plan and understand what constitutes a serving size in each of the food groups. Patients will also learn Pritikin-friendly foods that are better choices when away from home and review make-ahead meal and snack options. Calorie density will be reviewed and applied to three nutrition priorities: weight maintenance, weight loss, and weight gain. The purpose of this lesson is to reinforce (in a group setting) the key concepts around what patients are recommended to eat and how to apply these guidelines when away from home by planning and selecting Pritikin-friendly options. Patients will understand how calorie density may be adjusted for different weight management goals.  Mindful Eating  Clinical staff led group instruction and group discussion with PowerPoint presentation and patient guidebook. To enhance the learning environment the use of posters, models and videos may be added. Patients will briefly review the concepts of the Pritikin Eating Plan and the importance of low-calorie dense foods. The concept of mindful eating will be introduced as well as the importance of paying attention to internal  hunger signals. Triggers for non-hunger eating and techniques for dealing with triggers will be explored. The purpose of this lesson is to provide patients with the opportunity to review the basic principles of the Pritikin Eating Plan, discuss the value of eating mindfully and how to measure internal cues of hunger  and fullness using the Hunger Scale. Patients will also discuss reasons for non-hunger eating and learn strategies to use for controlling emotional eating.  Targeting Your Nutrition Priorities Clinical staff led group instruction and group discussion with PowerPoint presentation and patient guidebook. To enhance the learning environment the use of posters, models and videos may be added. Patients will learn how to determine their genetic susceptibility to disease by reviewing their family history. Patients will gain insight into the importance of diet as part of an overall healthy lifestyle in mitigating the impact of genetics and other environmental insults. The purpose of this lesson is to provide patients with the opportunity to assess their personal nutrition priorities by looking at their family history, their own health history and current risk factors. Patients will also be able to discuss ways of prioritizing and modifying the Pritikin Eating Plan for their highest risk areas  Menu  Clinical staff led group instruction and group discussion with PowerPoint presentation and patient guidebook. To enhance the learning environment the use of posters, models and videos may be added. Using menus brought in from [employer], or printed from [employer], patients will apply the Pritikin dining out guidelines that were presented in the [employer] video. Patients will also be able to practice these guidelines in a variety of provided scenarios. The purpose of this lesson is to provide patients with the opportunity to practice hands-on learning of the Pritikin Dining Out guidelines with actual menus and practice scenarios.  Label Reading Clinical staff led group instruction and group discussion with PowerPoint presentation and patient guidebook. To enhance the learning environment the use of posters, models and videos may be added. Patients will review and discuss the Pritikin label  reading guidelines presented in Pritikin's Label Reading Educational series video. Using fool labels brought in from local grocery stores and markets, patients will apply the label reading guidelines and determine if the packaged food meet the Pritikin guidelines. The purpose of this lesson is to provide patients with the opportunity to review, discuss, and practice hands-on learning of the Pritikin Label Reading guidelines with actual packaged food labels. Cooking School  Pritikin's [employer] are designed to teach patients ways to prepare quick, simple, and affordable recipes at home. The importance of nutrition's role in chronic disease risk reduction is reflected in its emphasis in the overall Pritikin program. By learning how to prepare essential core Pritikin Eating Plan recipes, patients will increase control over what they eat; be able to customize the flavor of foods without the use of added salt, sugar, or fat; and improve the quality of the food they consume. By learning a set of core recipes which are easily assembled, quickly prepared, and affordable, patients are more likely to prepare more healthy foods at home. These workshops focus on convenient breakfasts, simple entres, side dishes, and desserts which can be prepared with minimal effort and are consistent with nutrition recommendations for cardiovascular risk reduction. Cooking [employer] are taught by a Armed forces logistics/support/administrative officer (RD) who has been trained by the [employer]. The chef or RD has a clear understanding of the importance of minimizing - if not completely eliminating -  added fat, sugar, and sodium in recipes. Throughout the series of Cooking School Workshop sessions, patients will learn about healthy ingredients and efficient methods of cooking to build confidence in their capability to prepare    Cooking School weekly topics:  Adding Flavor- Sodium-Free  Fast and Healthy  Breakfasts  Powerhouse Plant-Based Proteins  Satisfying Salads and Dressings  Simple Sides and Sauces  International Cuisine-Spotlight on the [employer] Zones  Delicious Desserts  Savory Soups  [employer] - Meals in a Astronomer Appetizers and Snacks  Comforting Weekend Breakfasts  One-Pot Wonders   Fast Evening Meals  Landscape architect Your Pritikin Plate  WORKSHOPS   Healthy Mindset (Psychosocial):  Focused Goals, Sustainable Changes Clinical staff led group instruction and group discussion with PowerPoint presentation and patient guidebook. To enhance the learning environment the use of posters, models and videos may be added. Patients will be able to apply effective goal setting strategies to establish at least one personal goal, and then take consistent, meaningful action toward that goal. They will learn to identify common barriers to achieving personal goals and develop strategies to overcome them. Patients will also gain an understanding of how our mind-set can impact our ability to achieve goals and the importance of cultivating a positive and growth-oriented mind-set. The purpose of this lesson is to provide patients with a deeper understanding of how to set and achieve personal goals, as well as the tools and strategies needed to overcome common obstacles which may arise along the way.  From Head to Heart: The Power of a Healthy Outlook  Clinical staff led group instruction and group discussion with PowerPoint presentation and patient guidebook. To enhance the learning environment the use of posters, models and videos may be added. Patients will be able to recognize and describe the impact of emotions and mood on physical health. They will discover the importance of self-care and explore self-care practices which may work for them. Patients will also learn how to utilize the 4 C's to cultivate a healthier outlook and better manage stress and challenges. The  purpose of this lesson is to demonstrate to patients how a healthy outlook is an essential part of maintaining good health, especially as they continue their cardiac rehab journey.  Healthy Sleep for a Healthy Heart Clinical staff led group instruction and group discussion with PowerPoint presentation and patient guidebook. To enhance the learning environment the use of posters, models and videos may be added. At the conclusion of this workshop, patients will be able to demonstrate knowledge of the importance of sleep to overall health, well-being, and quality of life. They will understand the symptoms of, and treatments for, common sleep disorders. Patients will also be able to identify daytime and nighttime behaviors which impact sleep, and they will be able to apply these tools to help manage sleep-related challenges. The purpose of this lesson is to provide patients with a general overview of sleep and outline the importance of quality sleep. Patients will learn about a few of the most common sleep disorders. Patients will also be introduced to the concept of "sleep hygiene," and discover ways to self-manage certain sleeping problems through simple daily behavior changes. Finally, the workshop will motivate patients by clarifying the links between quality sleep and their goals of heart-healthy living.   Recognizing and Reducing Stress Clinical staff led group instruction and group discussion with PowerPoint presentation and patient guidebook. To enhance the learning environment the use of posters, models and videos may be  added. At the conclusion of this workshop, patients will be able to understand the types of stress reactions, differentiate between acute and chronic stress, and recognize the impact that chronic stress has on their health. They will also be able to apply different coping mechanisms, such as reframing negative self-talk. Patients will have the opportunity to practice a variety of stress  management techniques, such as deep abdominal breathing, progressive muscle relaxation, and/or guided imagery.  The purpose of this lesson is to educate patients on the role of stress in their lives and to provide healthy techniques for coping with it.  Learning Barriers/Preferences:  Learning Barriers/Preferences - 12/18/22 1031       Learning Barriers/Preferences   Learning Barriers Sight   wears glasses   Learning Preferences Audio;Computer/Internet;Group Instruction;Individual Instruction;Pictoral;Skilled Demonstration;Verbal Instruction;Video;Written Material             Education Topics:  Knowledge Questionnaire Score:  Knowledge Questionnaire Score - 12/18/22 1049       Knowledge Questionnaire Score   Pre Score 16/24             Core Components/Risk Factors/Patient Goals at Admission:  Personal Goals and Risk Factors at Admission - 12/18/22 1012       Core Components/Risk Factors/Patient Goals on Admission    Weight Management Yes;Weight Loss    Intervention Weight Management: Develop a combined nutrition and exercise program designed to reach desired caloric intake, while maintaining appropriate intake of nutrient and fiber, sodium and fats, and appropriate energy expenditure required for the weight goal.;Weight Management: Provide education and appropriate resources to help participant work on and attain dietary goals.    Expected Outcomes Short Term: Continue to assess and modify interventions until short term weight is achieved;Long Term: Adherence to nutrition and physical activity/exercise program aimed toward attainment of established weight goal;Weight Loss: Understanding of general recommendations for a balanced deficit meal plan, which promotes 1-2 lb weight loss per week and includes a negative energy balance of [phone] kcal/d;Understanding recommendations for meals to include 15-35% energy as protein, 25-35% energy from fat, 35-60% energy from carbohydrates,  less than 200mg  of dietary cholesterol, 20-35 gm of total fiber daily;Understanding of distribution of calorie intake throughout the day with the consumption of 4-5 meals/snacks    Hypertension Yes    Intervention Provide education on lifestyle modifcations including regular physical activity/exercise, weight management, moderate sodium restriction and increased consumption of fresh fruit, vegetables, and low fat dairy, alcohol moderation, and smoking cessation.;Monitor prescription use compliance.    Expected Outcomes Long Term: Maintenance of blood pressure at goal levels.;Short Term: Continued assessment and intervention until BP is < 140/32mm HG in hypertensive participants. < 130/38mm HG in hypertensive participants with diabetes, heart failure or chronic kidney disease.    Lipids Yes    Intervention Provide education and support for participant on nutrition & aerobic/resistive exercise along with prescribed medications to achieve LDL 70mg , HDL >40mg .    Expected Outcomes Long Term: Cholesterol controlled with medications as prescribed, with individualized exercise RX and with personalized nutrition plan. Value goals: LDL < 70mg , HDL > 40 mg.;Short Term: Participant states understanding of desired cholesterol values and is compliant with medications prescribed. Participant is following exercise prescription and nutrition guidelines.    Stress Yes    Intervention Offer individual and/or small group education and counseling on adjustment to heart disease, stress management and health-related lifestyle change. Teach and support self-help strategies.;Refer participants experiencing significant psychosocial distress to appropriate mental health specialists for further evaluation and treatment.  When possible, include family members and significant others in education/counseling sessions.    Expected Outcomes Long Term: Emotional wellbeing is indicated by absence of clinically significant psychosocial  distress or social isolation.;Short Term: Participant demonstrates changes in health-related behavior, relaxation and other stress management skills, ability to obtain effective social support, and compliance with psychotropic medications if prescribed.             Core Components/Risk Factors/Patient Goals Review:   Goals and Risk Factor Review     Row Name 12/28/22 0940             Core Components/Risk Factors/Patient Goals Review   Personal Goals Review Weight Management/Obesity;Hypertension;Lipids;Stress       Review [name] started exercise at cardiac rehab on 12/25/22. Vital signs were stable. patient is deconditioned.       Expected Outcomes [name] will continue to participate in cardiac rehab for exercise, nutrition and lifestyle modifications.                Core Components/Risk Factors/Patient Goals at Discharge (Final Review):   Goals and Risk Factor Review - 12/28/22 0940       Core Components/Risk Factors/Patient Goals Review   Personal Goals Review Weight Management/Obesity;Hypertension;Lipids;Stress    Review [name] started exercise at cardiac rehab on 12/25/22. Vital signs were stable. patient is deconditioned.    Expected Outcomes [name] will continue to participate in cardiac rehab for exercise, nutrition and lifestyle modifications.             ITP Comments:  ITP Comments     Row Name 12/18/22 1011 12/28/22 0917         ITP Comments Dr. Armanda Magic medical director. Introduction to pritikin education/intensive cardiac rehab. Initial orientation packet reviewed with patient. 30 Day ITP Review. [name] started exercise on 12/25/22. [name] did fair with exercise for her fitness level as [name] is deconditioned.               Comments: See ITP comments.Thayer Headings RN BSN

## 2022-12-30 ENCOUNTER — Encounter (HOSPITAL_COMMUNITY)
Admission: RE | Admit: 2022-12-30 | Discharge: 2022-12-30 | Disposition: A | Discharge status: Home / Self Care | Payer: Medicare HMO | Source: Ambulatory Visit | Attending: Cardiology | Admitting: Cardiology

## 2022-12-30 DIAGNOSIS — I2121 ST elevation (STEMI) myocardial infarction involving left circumflex coronary artery: Secondary | ICD-10-CM

## 2022-12-30 DIAGNOSIS — I252 Old myocardial infarction: Secondary | ICD-10-CM | POA: Diagnosis not present

## 2022-12-30 DIAGNOSIS — Z955 Presence of coronary angioplasty implant and graft: Secondary | ICD-10-CM

## 2023-01-01 ENCOUNTER — Encounter (HOSPITAL_COMMUNITY): Payer: Medicare HMO

## 2023-01-04 ENCOUNTER — Encounter (HOSPITAL_COMMUNITY)
Admission: RE | Admit: 2023-01-04 | Discharge: 2023-01-04 | Disposition: A | Discharge status: Home / Self Care | Payer: Medicare HMO | Source: Ambulatory Visit | Attending: Cardiology

## 2023-01-04 DIAGNOSIS — Z955 Presence of coronary angioplasty implant and graft: Secondary | ICD-10-CM

## 2023-01-04 DIAGNOSIS — I2121 ST elevation (STEMI) myocardial infarction involving left circumflex coronary artery: Secondary | ICD-10-CM

## 2023-01-04 DIAGNOSIS — I252 Old myocardial infarction: Secondary | ICD-10-CM | POA: Diagnosis not present

## 2023-01-06 ENCOUNTER — Encounter (HOSPITAL_COMMUNITY): Payer: Medicare HMO

## 2023-01-07 ENCOUNTER — Encounter (INDEPENDENT_AMBULATORY_CARE_PROVIDER_SITE_OTHER): Payer: Medicare HMO | Admitting: Family

## 2023-01-08 ENCOUNTER — Telehealth (HOSPITAL_COMMUNITY): Payer: Self-pay

## 2023-01-08 ENCOUNTER — Encounter (HOSPITAL_COMMUNITY): Payer: Medicare HMO

## 2023-01-08 NOTE — Telephone Encounter (Signed)
[name] called to cancel her appointment for today. She is not feeling well. Did advised her that we look forward to seeing her Monday and I will inform the staff.

## 2023-01-11 ENCOUNTER — Encounter (HOSPITAL_COMMUNITY)
Admission: RE | Admit: 2023-01-11 | Discharge: 2023-01-11 | Disposition: A | Discharge status: Home / Self Care | Payer: Medicare HMO | Source: Ambulatory Visit | Attending: Cardiology | Admitting: Cardiology

## 2023-01-11 DIAGNOSIS — Z7901 Long term (current) use of anticoagulants: Secondary | ICD-10-CM | POA: Insufficient documentation

## 2023-01-11 DIAGNOSIS — I2121 ST elevation (STEMI) myocardial infarction involving left circumflex coronary artery: Secondary | ICD-10-CM | POA: Diagnosis present

## 2023-01-11 DIAGNOSIS — Z79899 Other long term (current) drug therapy: Secondary | ICD-10-CM | POA: Diagnosis not present

## 2023-01-11 DIAGNOSIS — Z7902 Long term (current) use of antithrombotics/antiplatelets: Secondary | ICD-10-CM | POA: Insufficient documentation

## 2023-01-11 DIAGNOSIS — Z955 Presence of coronary angioplasty implant and graft: Secondary | ICD-10-CM | POA: Diagnosis present

## 2023-01-11 DIAGNOSIS — I252 Old myocardial infarction: Secondary | ICD-10-CM | POA: Insufficient documentation

## 2023-01-11 DIAGNOSIS — Z48812 Encounter for surgical aftercare following surgery on the circulatory system: Secondary | ICD-10-CM | POA: Diagnosis not present

## 2023-01-11 NOTE — Progress Notes (Incomplete)
QUALITY OF LIFE SCORE REVIEW  Pt completed Quality of Life survey as a participant in Cardiac Rehab.  Scores 21.0 or below are considered low.  Pt score very low in several areas Overall 20.27, Health and Function 17.43, socioeconomic 24.17, physiological and spiritual 20.43, family 23.90. Patient quality of life slightly altered by physical constraints which limits ability to perform as prior to recent cardiac illness. Bob says he is dissatisfied with his health due to his recent cardiac event and other health issues. Offered emotional support and reassurance.  Will continue to monitor and intervene as necessary.  Damyra Luscher, RN,BSN 02/26/2022 4:30 PM  

## 2023-01-12 NOTE — Progress Notes (Signed)
  This encounter was created in error - please disregard. No show 

## 2023-01-13 ENCOUNTER — Encounter (HOSPITAL_COMMUNITY)
Admission: RE | Admit: 2023-01-13 | Discharge: 2023-01-13 | Disposition: A | Discharge status: Home / Self Care | Payer: Medicare HMO | Source: Ambulatory Visit | Attending: Cardiology | Admitting: Cardiology

## 2023-01-13 DIAGNOSIS — Z955 Presence of coronary angioplasty implant and graft: Secondary | ICD-10-CM

## 2023-01-13 DIAGNOSIS — Z48812 Encounter for surgical aftercare following surgery on the circulatory system: Secondary | ICD-10-CM | POA: Diagnosis not present

## 2023-01-13 DIAGNOSIS — I2121 ST elevation (STEMI) myocardial infarction involving left circumflex coronary artery: Secondary | ICD-10-CM

## 2023-01-15 ENCOUNTER — Encounter (HOSPITAL_COMMUNITY): Payer: Medicare HMO

## 2023-01-18 ENCOUNTER — Encounter (HOSPITAL_COMMUNITY)
Admission: RE | Admit: 2023-01-18 | Discharge: 2023-01-18 | Disposition: A | Discharge status: Home / Self Care | Payer: Medicare HMO | Source: Ambulatory Visit | Attending: Cardiology | Admitting: Cardiology

## 2023-01-18 DIAGNOSIS — Z955 Presence of coronary angioplasty implant and graft: Secondary | ICD-10-CM

## 2023-01-18 DIAGNOSIS — I2121 ST elevation (STEMI) myocardial infarction involving left circumflex coronary artery: Secondary | ICD-10-CM

## 2023-01-18 NOTE — Progress Notes (Signed)
Patient reports that her right leg is bothering her. [name] says that she is going to see her PCP for evaluation. Mirena did not exercise today.Thayer Headings RN BSN

## 2023-01-20 ENCOUNTER — Encounter (HOSPITAL_COMMUNITY): Payer: Medicare HMO

## 2023-01-20 ENCOUNTER — Other Ambulatory Visit (HOSPITAL_COMMUNITY): Payer: Self-pay

## 2023-01-20 MED ORDER — METHYLPREDNISOLONE 4 MG PO TBPK
ORAL_TABLET | ORAL | 0 refills | Status: AC
  Filled 2023-01-20 – 2023-01-21 (×2): qty 21, 6d supply, fill #0

## 2023-01-21 ENCOUNTER — Other Ambulatory Visit (HOSPITAL_COMMUNITY): Payer: Self-pay

## 2023-01-21 ENCOUNTER — Other Ambulatory Visit: Payer: Self-pay

## 2023-01-21 MED ORDER — OXYCODONE-ACETAMINOPHEN 10-325 MG PO TABS
1.0000 | ORAL_TABLET | Freq: Two times a day (BID) | ORAL | 0 refills | Status: DC | PRN
  Filled 2023-01-21: qty 60, 30d supply, fill #0

## 2023-01-22 ENCOUNTER — Encounter (HOSPITAL_COMMUNITY): Payer: Medicare HMO

## 2023-01-25 ENCOUNTER — Encounter (HOSPITAL_COMMUNITY): Payer: Medicare HMO

## 2023-01-25 ENCOUNTER — Telehealth (HOSPITAL_COMMUNITY): Payer: Self-pay

## 2023-01-25 NOTE — Telephone Encounter (Signed)
[name] called and canceled her appointment for today and 02/08/2023. She has had a death in the family and will be unable to attend cardiac rehab!

## 2023-01-26 NOTE — Progress Notes (Signed)
Cardiac Individual Treatment Plan  Patient Details  Name: [name] MRN: [id number] Date of Birth: 09/28/1960 Referring Provider:   Flowsheet Row INTENSIVE CARDIAC REHAB ORIENT from 12/18/2022 in [facility]  Referring Provider Bryan Lemma, MD       Initial Encounter Date:  Flowsheet Row INTENSIVE CARDIAC REHAB ORIENT from 12/18/2022 in [facility]  Date 12/18/22       Visit Diagnosis: 11/21/22 DES LCx  11/21/22 STEMI  Patient's Home Medications on Admission:  Current Outpatient Medications:    aspirin EC 81 MG tablet, Take 1 tablet (81 mg total) by mouth daily. Swallow whole., Disp: 90 tablet, Rfl: 3   carvedilol (COREG) 6.25 MG tablet, Take 1 tablet (6.25 mg total) by mouth 2 (two) times daily with a meal., Disp: 180 tablet, Rfl: 3   clopidogrel (PLAVIX) 75 MG tablet, Take 1 tablet (75 mg total) by mouth daily with breakfast., Disp: 90 tablet, Rfl: 3   hydrochlorothiazide (HYDRODIURIL) 12.5 MG tablet, Take 1 tablet (12.5 mg total) by mouth daily., Disp: 90 tablet, Rfl: 3   methylPREDNISolone (MEDROL DOSEPAK) 4 MG TBPK tablet, Take 6 tablets (24 mg total) by mouth daily for 1 day, THEN 5 tablets (20 mg total) daily for 1 day, THEN 4 tablets (16 mg total) daily for 1 day, THEN 3 tablets (12 mg total) daily for 1 day, THEN 2 tablets (8 mg total) daily for 1 day, THEN 1 tablet (4 mg total) daily for 1 day., Disp: 21 tablet, Rfl: 0   NARCAN 4 MG/0.1ML LIQD nasal spray kit, , Disp: , Rfl:    nitroGLYCERIN (NITROSTAT) 0.4 MG SL tablet, Place 1 tablet (0.4 mg total) under the tongue every 5 (five) minutes x 3 doses as needed for chest pain., Disp: 25 tablet, Rfl: 2   oxyCODONE-acetaminophen (PERCOCET) 10-325 MG tablet, Take 1 tablet by mouth 2 (two) times daily as needed., Disp: 60 tablet, Rfl: 0   rivaroxaban (XARELTO) 10 MG TABS tablet, Take 1 tablet (10 mg total)  by mouth daily., Disp: 30 tablet, Rfl: 3   rosuvastatin (CRESTOR) 20 MG tablet, Take 1 tablet (20 mg total) by mouth daily., Disp: 90 tablet, Rfl: 3   tiZANidine (ZANAFLEX) 2 MG tablet, Take 1 tablet (2 mg total) by mouth every 8 (eight) hours as needed for muscle spasms., Disp: 90 tablet, Rfl: 3   Vitamin D, Ergocalciferol, (DRISDOL) 1.25 MG (50000 UNIT) CAPS capsule, Take 1 capsule (50,000 Units total) by mouth every 7 (seven) days., Disp: 5 capsule, Rfl: 3  Past Medical History: Past Medical History:  Diagnosis Date   DVT (deep venous thrombosis) (HCC)    Headache(784.0)    otc meds prn    Heart attack (HCC) 11/22/2022   History of hysterectomy 2014   Hyperlipidemia    Hypertension 09/2018   Plantar warts 08/2019   SVD (spontaneous vaginal delivery)    x 5   Vitamin D deficiency 09/2018    Tobacco Use: Social History   Tobacco Use  Smoking Status Former   Current packs/day: 0.00   Average packs/day: 0.3 packs/day for 38.0 years (9.5 ttl pk-yrs)   Types: Cigarettes   Quit date: 11/21/2022   Years since quitting: 0.1  Smokeless Tobacco Never  Tobacco Comments   3 per day.     Labs: Review Flowsheet  More data exists      Latest Ref Rng & Units 09/14/2018  02/03/2022 05/28/2022 11/21/2022 11/22/2022  Labs for ITP Cardiac and Pulmonary Rehab  Cholestrol 0 - 200 mg/dL 295  284  132  - 440   LDL (calc) 0 - 99 mg/dL 102  725  366  - 440   HDL-C >40 mg/dL 42  45  42  - 24   Trlycerides <150 mg/dL 75  347  67  - 63   Hemoglobin A1c 4.8 - 5.6 % 5.4  5.7  - 5.7  -  PH, Arterial 7.35 - 7.45 - - - 7.357  -  PCO2 arterial 32 - 48 mmHg - - - 36.7  -  Bicarbonate 20.0 - 28.0 mmol/L - - - 20.6  -  TCO2 22 - 32 mmol/L 22 - 32 mmol/L - - - 20  22  -  Acid-base deficit 0.0 - 2.0 mmol/L - - - 4.0  -  O2 Saturation % - - - 91  -    Details       Multiple values from one day are sorted in reverse-chronological order         Capillary Blood Glucose: No results found for:  "GLUCAP"   Exercise Target Goals: Exercise Program Goal: Individual exercise prescription set using results from initial 6 min walk test and THRR while considering  patient's activity barriers and safety.   Exercise Prescription Goal: Initial exercise prescription builds to 30-45 minutes a day of aerobic activity, 2-3 days per week.  Home exercise guidelines will be given to patient during program as part of exercise prescription that the participant will acknowledge.  Activity Barriers & Risk Stratification:  Activity Barriers & Cardiac Risk Stratification - 12/18/22 1106       Activity Barriers & Cardiac Risk Stratification   Activity Barriers Joint Problems;Deconditioning;Balance Concerns;History of Falls;Back Problems;Assistive Device    Cardiac Risk Stratification High   <5 METs on            6 Minute Walk:  6 Minute Walk     Row Name 12/18/22 1207         6 Minute Walk   Phase Initial     Distance 720 feet     Walk Time 6 minutes     # of Rest Breaks 0     MPH 1.36     METS 1.85     RPE 13     Perceived Dyspnea  2     VO2 Peak 6.49     Symptoms Yes (comment)     Comments SOB, resolved with rest. 8/10 bilateral calf pain, resolved with rest.     Resting HR 64 bpm     Resting BP 120/74     Resting Oxygen Saturation  100 %     Exercise Oxygen Saturation  during 6 min walk 100 %     Max Ex. HR 70 bpm     Max Ex. BP 124/60     2 Minute Post BP 110/62              Oxygen Initial Assessment:   Oxygen Re-Evaluation:   Oxygen Discharge (Final Oxygen Re-Evaluation):   Initial Exercise Prescription:  Initial Exercise Prescription - 12/18/22 1100       Date of Initial Exercise RX and Referring Provider   Date 12/18/22    Referring Provider Bryan Lemma, MD    Expected Discharge Date 03/08/23      NuStep   Level 1    SPM 60    Minutes  15    METs 2      Prescription Details   Frequency (times per week) 3    Duration Progress to 30  minutes of continuous aerobic without signs/symptoms of physical distress      Intensity   THRR 40-80% of Max Heartrate 63-126    Ratings of Perceived Exertion 11-13    Perceived Dyspnea 0-4      Progression   Progression Continue progressive overload as per policy without signs/symptoms or physical distress.      Resistance Training   Training Prescription Yes    Weight 2    Reps 10-15             Perform Capillary Blood Glucose checks as needed.  Exercise Prescription Changes:   Exercise Prescription Changes     Row Name 12/25/22 1632             Response to Exercise   Blood Pressure (Admit) 120/68       Blood Pressure (Exercise) 108/56       Blood Pressure (Exit) 122/80       Heart Rate (Admit) 66 bpm       Heart Rate (Exercise) 86 bpm       Heart Rate (Exit) 64 bpm       Rating of Perceived Exertion (Exercise) 11       Perceived Dyspnea (Exercise) 0       Symptoms 0       Comments Pt first day in the [facility] program       Duration Progress to 30 minutes of  aerobic without signs/symptoms of physical distress       Intensity THRR unchanged         Progression   Progression Continue to progress workloads to maintain intensity without signs/symptoms of physical distress.       Average METs 1.1         Resistance Training   Training Prescription No       Weight 2       Reps 10-15         NuStep   Level 1       SPM 42       Minutes 15       METs 1.1                Exercise Comments:   Exercise Comments     Row Name 12/25/22 1637 01/25/23 1434         Exercise Comments Pt first day in the [facility] program. Pt tolerated low intensity exercise well with an average MET level of 1.1. Pt is learning her THRR, RPE and ExRx Will review education when pt returns. She's absent due to a funeral.               Exercise Goals and Review:   Exercise Goals     Row Name 12/18/22 1011             Exercise Goals   Increase Physical  Activity Yes       Intervention Provide advice, education, support and counseling about physical activity/exercise needs.;Develop an individualized exercise prescription for aerobic and resistive training based on initial evaluation findings, risk stratification, comorbidities and participant's personal goals.       Expected Outcomes Short Term: Attend rehab on a regular basis to increase amount of physical activity.;Long Term: Exercising regularly at least 3-5 days a week.;Long Term: Add in home exercise to make  exercise part of routine and to increase amount of physical activity.       Increase Strength and Stamina Yes       Intervention Provide advice, education, support and counseling about physical activity/exercise needs.;Develop an individualized exercise prescription for aerobic and resistive training based on initial evaluation findings, risk stratification, comorbidities and participant's personal goals.       Expected Outcomes Short Term: Increase workloads from initial exercise prescription for resistance, speed, and METs.;Short Term: Perform resistance training exercises routinely during rehab and add in resistance training at home;Long Term: Improve cardiorespiratory fitness, muscular endurance and strength as measured by increased METs and functional capacity ( )       Able to understand and use rate of perceived exertion (RPE) scale Yes       Intervention Provide education and explanation on how to use RPE scale       Expected Outcomes Short Term: Able to use RPE daily in rehab to express subjective intensity level;Long Term:  Able to use RPE to guide intensity level when exercising independently       Knowledge and understanding of Target Heart Rate Range (THRR) Yes       Intervention Provide education and explanation of THRR including how the numbers were predicted and where they are located for reference       Expected Outcomes Short Term: Able to state/look up THRR;Short Term: Able  to use daily as guideline for intensity in rehab;Long Term: Able to use THRR to govern intensity when exercising independently       Understanding of Exercise Prescription Yes       Intervention Provide education, explanation, and written materials on patient's individual exercise prescription       Expected Outcomes Short Term: Able to explain program exercise prescription;Long Term: Able to explain home exercise prescription to exercise independently                Exercise Goals Re-Evaluation :  Exercise Goals Re-Evaluation     Row Name 12/25/22 1635             Exercise Goal Re-Evaluation   Exercise Goals Review Increase Physical Activity;Understanding of Exercise Prescription;Increase Strength and Stamina;Knowledge and understanding of Target Heart Rate Range (THRR);Able to understand and use rate of perceived exertion (RPE) scale       Comments Pt first day in the [facility] program. Pt tolerated low intensity exercise well with an average MET level of 1.1. Pt is learning her THRR, RPE and ExRx       Expected Outcomes Will continue to monitor pt and progress workloads as tolerated without sign or symptom                Discharge Exercise Prescription (Final Exercise Prescription Changes):  Exercise Prescription Changes - 12/25/22 1632       Response to Exercise   Blood Pressure (Admit) 120/68    Blood Pressure (Exercise) 108/56    Blood Pressure (Exit) 122/80    Heart Rate (Admit) 66 bpm    Heart Rate (Exercise) 86 bpm    Heart Rate (Exit) 64 bpm    Rating of Perceived Exertion (Exercise) 11    Perceived Dyspnea (Exercise) 0    Symptoms 0    Comments Pt first day in the [facility] program    Duration Progress to 30 minutes of  aerobic without signs/symptoms of physical distress    Intensity THRR unchanged      Progression   Progression  Continue to progress workloads to maintain intensity without signs/symptoms of physical distress.    Average METs 1.1       Resistance Training   Training Prescription No    Weight 2    Reps 10-15      NuStep   Level 1    SPM 42    Minutes 15    METs 1.1             Nutrition:  Target Goals: Understanding of nutrition guidelines, daily intake of sodium 1500mg , cholesterol 200mg , calories 30% from fat and 7% or less from saturated fats, daily to have 5 or more servings of fruits and vegetables.  Biometrics:  Pre Biometrics - 12/18/22 1025       Pre Biometrics   Waist Circumference 43 inches    Hip Circumference 47 inches    Waist to Hip Ratio 0.91 %    Triceps Skinfold 46 mm    % Body Fat 44.6 %    Grip Strength 12 kg    Flexibility --   not done due to low back pain   Single Leg Stand --   not done, pt uses cane             Nutrition Therapy Plan and Nutrition Goals:  Nutrition Therapy & Goals - 01/25/23 1511       Nutrition Therapy   Diet Heart healthy diet    Drug/Food Interactions Statins/Certain Fruits      Personal Nutrition Goals   Nutrition Goal Patient to identify strategies for reducing cardiovascular risk by attending the [facility] education and nutrition series weekly.   goal in progress.   Personal Goal #2 Patient to improve diet quality by using the plate method as a guide for meal planning to include lean protein/plant protein, fruits, vegetables, whole grains, nonfat dairy as part of a well-balanced diet.   goal in progress.   Personal Goal #3 Patient to reduce sodium to 1500mg  per day   goal in progress.   Comments Goals in progress. Mckenzy's attendance to intensive cardiac rehab remains variable; she has attended 6 exercise sessions and 4 education sessions. 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 Her LDL remains above goal and A1c is in a pre-diabetic range. She has maintained her weight since starting with our program.  Patient will benefit from participation in intensive cardiac rehab for nutrition, exercise, and lifestyle modification.       Intervention Plan   Intervention Prescribe, educate and counsel regarding individualized specific dietary modifications aiming towards targeted core components such as weight, hypertension, lipid management, diabetes, heart failure and other comorbidities.;Nutrition handout(s) given to patient.    Expected Outcomes Short Term Goal: Understand basic principles of dietary content, such as calories, fat, sodium, cholesterol and nutrients.;Long Term Goal: Adherence to prescribed nutrition plan.             Nutrition Assessments:  MEDIFICTS Score Key: >=70 Need to make dietary changes  40-70 Heart Healthy Diet <= 40 Therapeutic Level Cholesterol Diet    Picture Your Plate Scores: <59 Unhealthy dietary pattern with much room for improvement. 41-50 Dietary pattern unlikely to meet recommendations for good health and room for improvement. 51-60 More healthful dietary pattern, with some room for improvement.  >60 Healthy dietary pattern, although there may be some specific behaviors that could be improved.    Nutrition Goals Re-Evaluation:  Nutrition Goals Re-Evaluation     Row Name 12/25/22 1529 01/25/23 1511  Goals   Current Weight 186 lb 15.2 oz (84.8 kg) 184 lb 1.4 oz (83.5 kg)      Comment LDL 112, HDL 24, potassium 3.4, Lpa 201.5, A1c 5.7 no new labs; most recent labs  LDL 112, HDL 24, potassium 3.4, Lpa 201.5, A1c 5.7      Expected Outcome Nakayah has medical history of STEMI, HTN, VTE. Her LDL remains above goal and A1c is in a pre-diabetic range. Patient will benefit from participation in intensive cardiac rehab for nutrition, exercise, and lifestyle modification. Goals in progress. Azana's attendance to intensive cardiac rehab remains variable; she has attended 6 exercise sessions and 4 education sessions. Christasia has medical history of STEMI, HTN, VTE. Her LDL remains above goal and A1c is in a pre-diabetic range. She has maintained her weight since starting with our  program. Patient will benefit from participation in intensive cardiac rehab for nutrition, exercise, and lifestyle modification.               Nutrition Goals Re-Evaluation:  Nutrition Goals Re-Evaluation     Row Name 12/25/22 1529 01/25/23 1511           Goals   Current Weight 186 lb 15.2 oz (84.8 kg) 184 lb 1.4 oz (83.5 kg)      Comment LDL 112, HDL 24, potassium 3.4, Lpa 201.5, A1c 5.7 no new labs; most recent labs  LDL 112, HDL 24, potassium 3.4, Lpa 201.5, A1c 5.7      Expected Outcome Rin has medical history of STEMI, HTN, VTE. Her LDL remains above goal and A1c is in a pre-diabetic range. Patient will benefit from participation in intensive cardiac rehab for nutrition, exercise, and lifestyle modification. Goals in progress. Irie's attendance to intensive cardiac rehab remains variable; she has attended 6 exercise sessions and 4 education sessions. Ilar has medical history of STEMI, HTN, VTE. Her LDL remains above goal and A1c is in a pre-diabetic range. She has maintained her weight since starting with our program. Patient will benefit from participation in intensive cardiac rehab for nutrition, exercise, and lifestyle modification.               Nutrition Goals Discharge (Final Nutrition Goals Re-Evaluation):  Nutrition Goals Re-Evaluation - 01/25/23 1511       Goals   Current Weight 184 lb 1.4 oz (83.5 kg)    Comment no new labs; most recent labs  LDL 112, HDL 24, potassium 3.4, Lpa 201.5, A1c 5.7    Expected Outcome Goals in progress. Correna's attendance to intensive cardiac rehab remains variable; she has attended 6 exercise sessions and 4 education sessions. Subrina has medical history of STEMI, HTN, VTE. Her LDL remains above goal and A1c is in a pre-diabetic range. She has maintained her weight since starting with our program. Patient will benefit from participation in intensive cardiac rehab for nutrition, exercise, and lifestyle modification.              Psychosocial: Target Goals: Acknowledge presence or absence of significant depression and/or stress, maximize coping skills, provide positive support system. Participant is able to verbalize types and ability to use techniques and skills needed for reducing stress and depression.  Initial Review & Psychosocial Screening:  Initial Psych Review & Screening - 12/18/22 1029       Initial Review   Current issues with Current Depression;Current Stress Concerns    Source of Stress Concerns Family    Comments Declan shared that she has had some feelings of depression  since losing her son and husband 3 years ago, along with other familial stress. She has a large family and a lot of support, however is still grieving her recent losses. Areliz is not in counseling or on medication, however she is interested in receiving additional resources. Will discuss with RN to encourage Londan to schedule with PCP.      Family Dynamics   Good Support System? Yes   Keriana has her daughters for support     Barriers   Psychosocial barriers to participate in program The patient should benefit from training in stress management and relaxation.      Screening Interventions   Interventions To provide support and resources with identified psychosocial needs;Provide feedback about the scores to participant;Encouraged to exercise    Expected Outcomes Short Term goal: Utilizing psychosocial counselor, staff and physician to assist with identification of specific Stressors or current issues interfering with healing process. Setting desired goal for each stressor or current issue identified.;Long Term Goal: Stressors or current issues are controlled or eliminated.;Short Term goal: Identification and review with participant of any Quality of Life or Depression concerns found by scoring the questionnaire.;Long Term goal: The participant improves quality of Life and PHQ9 Scores as seen by post scores and/or  verbalization of changes             Quality of Life Scores:  Quality of Life - 12/18/22 1106       Quality of Life   Select Quality of Life      Quality of Life Scores   Health/Function Pre 20.8 %    Socioeconomic Pre 19.5 %    Psych/Spiritual Pre 24.86 %    Family Pre 28.8 %    GLOBAL Pre 22.54 %            Scores of 19 and below usually indicate a poorer quality of life in these areas.  A difference of  2-3 points is a clinically meaningful difference.  A difference of 2-3 points in the total score of the Quality of Life Index has been associated with significant improvement in overall quality of life, self-image, physical symptoms, and general health in studies assessing change in quality of life.  PHQ-9: Review Flowsheet  More data exists      12/22/2022 12/18/2022 06/17/2022 05/20/2022 03/30/2022  Depression screen PHQ 2/9  Decreased Interest 0 2 0 0 2  Down, Depressed, Hopeless 0 0 0 0 1  PHQ - 2 Score 0 2 0 0 3  Altered sleeping 0 3 - - 3  Tired, decreased energy 0 2 - - 3  Change in appetite 0 2 - - 0  Feeling bad or failure about yourself  0 0 - - 0  Trouble concentrating 0 0 - - 0  Moving slowly or fidgety/restless 0 2 - - 3  Suicidal thoughts 0 0 - - 0  PHQ-9 Score 0 11 - - 12  Difficult doing work/chores Not difficult at all Somewhat difficult - - Not difficult at all    Details           Interpretation of Total Score  Total Score Depression Severity:  1-4 = Minimal depression, 5-9 = Mild depression, 10-14 = Moderate depression, 15-19 = Moderately severe depression, 20-27 = Severe depression   Psychosocial Evaluation and Intervention:   Psychosocial Re-Evaluation:  Psychosocial Re-Evaluation     Row Name 12/28/22 0919 01/14/23 0833 01/26/23 1601         Psychosocial Re-Evaluation  Current issues with Current Stress Concerns;Current Depression Current Stress Concerns;Current Depression Current Stress Concerns;Current Depression      Comments Will review quality of life and PHQ2-9. Will discuss counselling options as Klaudia has not discussed her depression with her PCP . Quality of life questionnaire reviewed. Carmelita admits to being depressed due to recent MI/cardiac stenting. Drenna admits to being depressed. Sharda is not taking an antidepressant at this time. Josie says she has been through a lot with the passing of her husband in 2020 and the passing of her son in 2022. Walker said that her son's children also lost their mother due to a Fentanyl overdose.Patient's primary care provider, [facility] called and notified. Natlie says she is interested in counseling. Faxed quality of life and PHQ 2-9 to PCP provider.  Offered emotional support and reassurance.  Will continue to monitor and intervene as necessary. Annora says that participating in cardiac rehab has been helpful for her so far. Kajol's last day of attendance was on 01/18/23. Jovanda is currently out due to a death in the family.     Expected Outcomes Demika will have controlled or decreased depression upon compleiton of cardiac rehab. Ahnya will have controlled or decreased depression upon compleiton of cardiac rehab. Marvin will have controlled or decreased depression upon compleiton of cardiac rehab.     Interventions Stress management education;Relaxation education;Encouraged to attend Cardiac Rehabilitation for the exercise Stress management education;Relaxation education;Encouraged to attend Cardiac Rehabilitation for the exercise Stress management education;Relaxation education;Encouraged to attend Cardiac Rehabilitation for the exercise     Continue Psychosocial Services  Follow up required by staff Follow up required by staff Follow up required by staff       Initial Review   Source of Stress Concerns Chronic Illness;Family Chronic Illness;Family Chronic Illness;Family     Comments Will continue to monitor and offer support as needed Will  continue to monitor and offer support as needed Will continue to monitor and offer support as needed              Psychosocial Discharge (Final Psychosocial Re-Evaluation):  Psychosocial Re-Evaluation - 01/26/23 1601       Psychosocial Re-Evaluation   Current issues with Current Stress Concerns;Current Depression    Comments Ahlani's last day of attendance was on 01/18/23. Kaitlee is currently out due to a death in the family.    Expected Outcomes Ivelisse will have controlled or decreased depression upon compleiton of cardiac rehab.    Interventions Stress management education;Relaxation education;Encouraged to attend Cardiac Rehabilitation for the exercise    Continue Psychosocial Services  Follow up required by staff      Initial Review   Source of Stress Concerns Chronic Illness;Family    Comments Will continue to monitor and offer support as needed             Vocational Rehabilitation: Provide vocational rehab assistance to qualifying candidates.   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 Participant will state understanding/return demonstration of topics presented.    Education     Row Name 12/25/22 1500     Education   Cardiac Education Topics Pritikin   Select Core  Videos     Core Videos   Educator Dietitian   Select Nutrition   Nutrition Vitamins and Minerals   Instruction Review Code 1- Verbalizes Understanding   Class Start Time 1400   Class Stop Time 1435   Class Time Calculation (min) 35 min    Row Name 12/30/22 1500     Education   Cardiac Education Topics Pritikin   Customer service manager   Weekly  Topic International Cuisine- Spotlight on the [employer] Zones   Instruction Review Code 1- Verbalizes Understanding   Class Start Time 1400   Class Stop Time 1440   Class Time Calculation (min) 40 min    Row Name 01/04/23 1500     Education   Cardiac Education Topics Pritikin   Geographical information systems officer Psychosocial   Psychosocial Workshop Healthy Sleep for a Healthy Heart   Instruction Review Code 1- Verbalizes Understanding   Class Start Time 1400   Class Stop Time 1450   Class Time Calculation (min) 50 min    Row Name 01/18/23 1700     Education   Cardiac Education Topics Pritikin   Geographical information systems officer Psychosocial   Psychosocial Workshop From Head to Heart: The Power of a Healthy Outlook   Instruction Review Code 1- Verbalizes Understanding   Class Start Time 1400   Class Stop Time 1450   Class Time Calculation (min) 50 min            Core Videos: Exercise    Move It!  Clinical staff conducted group or individual video education with verbal and written material and guidebook.  Patient learns the recommended Pritikin exercise program. Exercise with the goal of living a long, healthy life. Some of the health benefits of exercise include controlled diabetes, healthier blood pressure levels, improved cholesterol levels, improved heart and lung capacity, improved sleep, and better body composition. Everyone should speak with their doctor before starting or changing an exercise routine.  Biomechanical Limitations Clinical staff conducted group or individual video education with verbal and written material and guidebook.  Patient learns how biomechanical limitations can impact exercise and how we can mitigate and possibly overcome limitations to have an impactful and balanced exercise routine.  Body Composition Clinical staff conducted group or individual video education  with verbal and written material and guidebook.  Patient learns that body composition (ratio of muscle mass to fat mass) is a key component to assessing overall fitness, rather than body weight alone. Increased fat mass, especially visceral belly fat, can put [place] at increased risk for metabolic syndrome, type 2 diabetes, heart disease, and even death. It is recommended to combine diet and exercise (cardiovascular and resistance training) to improve your body composition. Seek guidance from your physician and exercise physiologist before implementing an exercise routine.  Exercise Action Plan Clinical staff conducted group or individual video education with verbal and written material and guidebook.  Patient learns the recommended strategies to achieve and enjoy long-term exercise adherence, including variety, self-motivation, self-efficacy, and positive decision making. Benefits of exercise include fitness, good health, weight management, more energy, better sleep, less stress, and overall well-being.  Medical   Heart Disease Risk Reduction Clinical staff conducted group or individual video education with verbal and written material and guidebook.  Patient learns our heart is our most  vital organ as it circulates oxygen, nutrients, white blood cells, and hormones throughout the entire body, and carries waste away. Data supports a plant-based eating plan like the Pritikin Program for its effectiveness in slowing progression of and reversing heart disease. The video provides a number of recommendations to address heart disease.   Metabolic Syndrome and Belly Fat  Clinical staff conducted group or individual video education with verbal and written material and guidebook.  Patient learns what metabolic syndrome is, how it leads to heart disease, and how one can reverse it and keep it from coming back. You have metabolic syndrome if you have 3 of the following 5 criteria: abdominal obesity, high blood  pressure, high triglycerides, low HDL cholesterol, and high blood sugar.  Hypertension and Heart Disease Clinical staff conducted group or individual video education with verbal and written material and guidebook.  Patient learns that high blood pressure, or hypertension, is very common in the [place]. Hypertension is largely due to excessive salt intake, but other important risk factors include being overweight, physical inactivity, drinking too much alcohol, smoking, and not eating enough potassium from fruits and vegetables. High blood pressure is a leading risk factor for heart attack, stroke, congestive heart failure, dementia, kidney failure, and premature death. Long-term effects of excessive salt intake include stiffening of the arteries and thickening of heart muscle and organ damage. Recommendations include ways to reduce hypertension and the risk of heart disease.  Diseases of Our Time - Focusing on Diabetes Clinical staff conducted group or individual video education with verbal and written material and guidebook.  Patient learns why the best way to stop diseases of our time is prevention, through food and other lifestyle changes. Medicine (such as prescription pills and surgeries) is often only a Band-Aid on the problem, not a long-term solution. Most common diseases of our time include obesity, type 2 diabetes, hypertension, heart disease, and cancer. The Pritikin Program is recommended and has been proven to help reduce, reverse, and/or prevent the damaging effects of metabolic syndrome.  Nutrition   Overview of the Pritikin Eating Plan  Clinical staff conducted group or individual video education with verbal and written material and guidebook.  Patient learns about the Pritikin Eating Plan for disease risk reduction. The Pritikin Eating Plan emphasizes a wide variety of unrefined, minimally-processed carbohydrates, like fruits, vegetables, whole grains, and legumes. Go, Caution,  and Stop food choices are explained. Plant-based and lean animal proteins are emphasized. Rationale provided for low sodium intake for blood pressure control, low added sugars for blood sugar stabilization, and low added fats and oils for coronary artery disease risk reduction and weight management.  Calorie Density  Clinical staff conducted group or individual video education with verbal and written material and guidebook.  Patient learns about calorie density and how it impacts the Pritikin Eating Plan. Knowing the characteristics of the food you choose will help you decide whether those foods will lead to weight gain or weight loss, and whether you want to consume more or less of them. Weight loss is usually a side effect of the Pritikin Eating Plan because of its focus on low calorie-dense foods.  Label Reading  Clinical staff conducted group or individual video education with verbal and written material and guidebook.  Patient learns about the Pritikin recommended label reading guidelines and corresponding recommendations regarding calorie density, added sugars, sodium content, and whole grains.  Dining Out - Part 1  Clinical staff conducted group or individual video education with verbal  and written material and guidebook.  Patient learns that restaurant meals can be sabotaging because they can be so high in calories, fat, sodium, and/or sugar. Patient learns recommended strategies on how to positively address this and avoid unhealthy pitfalls.  Facts on Fats  Clinical staff conducted group or individual video education with verbal and written material and guidebook.  Patient learns that lifestyle modifications can be just as effective, if not more so, as many medications for lowering your risk of heart disease. A Pritikin lifestyle can help to reduce your risk of inflammation and atherosclerosis (cholesterol build-up, or plaque, in the artery walls). Lifestyle interventions such as dietary  choices and physical activity address the cause of atherosclerosis. A review of the types of fats and their impact on blood cholesterol levels, along with dietary recommendations to reduce fat intake is also included.  Nutrition Action Plan  Clinical staff conducted group or individual video education with verbal and written material and guidebook.  Patient learns how to incorporate Pritikin recommendations into their lifestyle. Recommendations include planning and keeping personal health goals in mind as an important part of their success.  Healthy Mind-Set    Healthy Minds, Bodies, Hearts  Clinical staff conducted group or individual video education with verbal and written material and guidebook.  Patient learns how to identify when they are stressed. Video will discuss the impact of that stress, as well as the many benefits of stress management. Patient will also be introduced to stress management techniques. The way we think, act, and feel has an impact on our hearts.  How Our Thoughts Can Heal Our Hearts  Clinical staff conducted group or individual video education with verbal and written material and guidebook.  Patient learns that negative thoughts can cause depression and anxiety. This can result in negative lifestyle behavior and serious health problems. Cognitive behavioral therapy is an effective method to help control our thoughts in order to change and improve our emotional outlook.  Additional Videos:  Exercise    Improving Performance  Clinical staff conducted group or individual video education with verbal and written material and guidebook.  Patient learns to use a non-linear approach by alternating intensity levels and lengths of time spent exercising to help burn more calories and lose more body fat. Cardiovascular exercise helps improve heart health, metabolism, hormonal balance, blood sugar control, and recovery from fatigue. Resistance training improves strength, endurance,  balance, coordination, reaction time, metabolism, and muscle mass. Flexibility exercise improves circulation, posture, and balance. Seek guidance from your physician and exercise physiologist before implementing an exercise routine and learn your capabilities and proper form for all exercise.  Introduction to Yoga  Clinical staff conducted group or individual video education with verbal and written material and guidebook.  Patient learns about yoga, a discipline of the coming together of mind, breath, and body. The benefits of yoga include improved flexibility, improved range of motion, better posture and core strength, increased lung function, weight loss, and positive self-image. Yoga's heart health benefits include lowered blood pressure, healthier heart rate, decreased cholesterol and triglyceride levels, improved immune function, and reduced stress. Seek guidance from your physician and exercise physiologist before implementing an exercise routine and learn your capabilities and proper form for all exercise.  Medical   Aging: Enhancing Your Quality of Life  Clinical staff conducted group or individual video education with verbal and written material and guidebook.  Patient learns key strategies and recommendations to stay in good physical health and enhance quality of life, such  as prevention strategies, having an advocate, securing a Health Care Proxy and Power of Attorney, and keeping a list of medications and system for tracking them. It also discusses how to avoid risk for bone loss.  Biology of Weight Control  Clinical staff conducted group or individual video education with verbal and written material and guidebook.  Patient learns that weight gain occurs because we consume more calories than we burn (eating more, moving less). Even if your body weight is normal, you may have higher ratios of fat compared to muscle mass. Too much body fat puts you at increased risk for cardiovascular disease,  heart attack, stroke, type 2 diabetes, and obesity-related cancers. In addition to exercise, following the Pritikin Eating Plan can help reduce your risk.  Decoding Lab Results  Clinical staff conducted group or individual video education with verbal and written material and guidebook.  Patient learns that lab test reflects one measurement whose values change over time and are influenced by many factors, including medication, stress, sleep, exercise, food, hydration, pre-existing medical conditions, and more. It is recommended to use the knowledge from this video to become more involved with your lab results and evaluate your numbers to speak with your doctor.   Diseases of Our Time - Overview  Clinical staff conducted group or individual video education with verbal and written material and guidebook.  Patient learns that according to the CDC, 50% to 70% of chronic diseases (such as obesity, type 2 diabetes, elevated lipids, hypertension, and heart disease) are avoidable through lifestyle improvements including healthier food choices, listening to satiety cues, and increased physical activity.  Sleep Disorders Clinical staff conducted group or individual video education with verbal and written material and guidebook.  Patient learns how good quality and duration of sleep are important to overall health and well-being. Patient also learns about sleep disorders and how they impact health along with recommendations to address them, including discussing with a physician.  Nutrition  Dining Out - Part 2 Clinical staff conducted group or individual video education with verbal and written material and guidebook.  Patient learns how to plan ahead and communicate in order to maximize their dining experience in a healthy and nutritious manner. Included are recommended food choices based on the type of restaurant the patient is visiting.   Fueling a Banker conducted group or  individual video education with verbal and written material and guidebook.  There is a strong connection between our food choices and our health. Diseases like obesity and type 2 diabetes are very prevalent and are in large-part due to lifestyle choices. The Pritikin Eating Plan provides plenty of food and hunger-curbing satisfaction. It is easy to follow, affordable, and helps reduce health risks.  Menu Workshop  Clinical staff conducted group or individual video education with verbal and written material and guidebook.  Patient learns that restaurant meals can sabotage health goals because they are often packed with calories, fat, sodium, and sugar. Recommendations include strategies to plan ahead and to communicate with the manager, chef, or server to help order a healthier meal.  Planning Your Eating Strategy  Clinical staff conducted group or individual video education with verbal and written material and guidebook.  Patient learns about the Pritikin Eating Plan and its benefit of reducing the risk of disease. The Pritikin Eating Plan does not focus on calories. Instead, it emphasizes high-quality, nutrient-rich foods. By knowing the characteristics of the foods, we choose, we can determine their calorie density and make  informed decisions.  Targeting Your Nutrition Priorities  Clinical staff conducted group or individual video education with verbal and written material and guidebook.  Patient learns that lifestyle habits have a tremendous impact on disease risk and progression. This video provides eating and physical activity recommendations based on your personal health goals, such as reducing LDL cholesterol, losing weight, preventing or controlling type 2 diabetes, and reducing high blood pressure.  Vitamins and Minerals  Clinical staff conducted group or individual video education with verbal and written material and guidebook.  Patient learns different ways to obtain key vitamins and  minerals, including through a recommended healthy diet. It is important to discuss all supplements you take with your doctor.   Healthy Mind-Set    Smoking Cessation  Clinical staff conducted group or individual video education with verbal and written material and guidebook.  Patient learns that cigarette smoking and tobacco addiction pose a serious health risk which affects millions of people. Stopping smoking will significantly reduce the risk of heart disease, lung disease, and many forms of cancer. Recommended strategies for quitting are covered, including working with your doctor to develop a successful plan.  Culinary   Becoming a Set designer conducted group or individual video education with verbal and written material and guidebook.  Patient learns that cooking at home can be healthy, cost-effective, quick, and puts them in control. Keys to cooking healthy recipes will include looking at your recipe, assessing your equipment needs, planning ahead, making it simple, choosing cost-effective seasonal ingredients, and limiting the use of added fats, salts, and sugars.  Cooking - Breakfast and Snacks  Clinical staff conducted group or individual video education with verbal and written material and guidebook.  Patient learns how important breakfast is to satiety and nutrition through the entire day. Recommendations include key foods to eat during breakfast to help stabilize blood sugar levels and to prevent overeating at meals later in the day. Planning ahead is also a key component.  Cooking - Educational psychologist conducted group or individual video education with verbal and written material and guidebook.  Patient learns eating strategies to improve overall health, including an approach to cook more at home. Recommendations include thinking of animal protein as a side on your plate rather than center stage and focusing instead on lower calorie dense options like  vegetables, fruits, whole grains, and plant-based proteins, such as beans. Making sauces in large quantities to freeze for later and leaving the skin on your vegetables are also recommended to maximize your experience.  Cooking - Healthy Salads and Dressing Clinical staff conducted group or individual video education with verbal and written material and guidebook.  Patient learns that vegetables, fruits, whole grains, and legumes are the foundations of the Pritikin Eating Plan. Recommendations include how to incorporate each of these in flavorful and healthy salads, and how to create homemade salad dressings. Proper handling of ingredients is also covered. Cooking - Soups and [employer] - Soups and Desserts Clinical staff conducted group or individual video education with verbal and written material and guidebook.  Patient learns that Pritikin soups and desserts make for easy, nutritious, and delicious snacks and meal components that are low in sodium, fat, sugar, and calorie density, while high in vitamins, minerals, and filling fiber. Recommendations include simple and healthy ideas for soups and desserts.   Overview     The Pritikin Solution Program Overview Clinical staff conducted group or individual video education with verbal and  written material and guidebook.  Patient learns that the results of the Pritikin Program have been documented in more than 100 articles published in peer-reviewed journals, and the benefits include reducing risk factors for (and, in some cases, even reversing) high cholesterol, high blood pressure, type 2 diabetes, obesity, and more! An overview of the three key pillars of the Pritikin Program will be covered: eating well, doing regular exercise, and having a healthy mind-set.  WORKSHOPS  Exercise: Exercise Basics: Building Your Action Plan Clinical staff led group instruction and group discussion with PowerPoint presentation and patient guidebook. To enhance  the learning environment the use of posters, models and videos may be added. At the conclusion of this workshop, patients will comprehend the difference between physical activity and exercise, as well as the benefits of incorporating both, into their routine. Patients will understand the FITT (Frequency, Intensity, Time, and Type) principle and how to use it to build an exercise action plan. In addition, safety concerns and other considerations for exercise and cardiac rehab will be addressed by the presenter. The purpose of this lesson is to promote a comprehensive and effective weekly exercise routine in order to improve patients' overall level of fitness.   Managing Heart Disease: Your Path to a Healthier Heart Clinical staff led group instruction and group discussion with PowerPoint presentation and patient guidebook. To enhance the learning environment the use of posters, models and videos may be added.At the conclusion of this workshop, patients will understand the anatomy and physiology of the heart. Additionally, they will understand how Pritikin's three pillars impact the risk factors, the progression, and the management of heart disease.  The purpose of this lesson is to provide a high-level overview of the heart, heart disease, and how the Pritikin lifestyle positively impacts risk factors.  Exercise Biomechanics Clinical staff led group instruction and group discussion with PowerPoint presentation and patient guidebook. To enhance the learning environment the use of posters, models and videos may be added. Patients will learn how the structural parts of their bodies function and how these functions impact their daily activities, movement, and exercise. Patients will learn how to promote a neutral spine, learn how to manage pain, and identify ways to improve their physical movement in order to promote healthy living. The purpose of this lesson is to expose patients to common  physical limitations that impact physical activity. Participants will learn practical ways to adapt and manage aches and pains, and to minimize their effect on regular exercise. Patients will learn how to maintain good posture while sitting, walking, and lifting.  Balance Training and Fall Prevention  Clinical staff led group instruction and group discussion with PowerPoint presentation and patient guidebook. To enhance the learning environment the use of posters, models and videos may be added. At the conclusion of this workshop, patients will understand the importance of their sensorimotor skills (vision, proprioception, and the vestibular system) in maintaining their ability to balance as they age. Patients will apply a variety of balancing exercises that are appropriate for their current level of function. Patients will understand the common causes for poor balance, possible solutions to these problems, and ways to modify their physical environment in order to minimize their fall risk. The purpose of this lesson is to teach patients about the importance of maintaining balance as they age and ways to minimize their risk of falling.  WORKSHOPS   Nutrition:  Fueling a Ship broker led group instruction and group discussion with PowerPoint presentation and patient  guidebook. To enhance the learning environment the use of posters, models and videos may be added. Patients will review the foundational principles of the Pritikin Eating Plan and understand what constitutes a serving size in each of the food groups. Patients will also learn Pritikin-friendly foods that are better choices when away from home and review make-ahead meal and snack options. Calorie density will be reviewed and applied to three nutrition priorities: weight maintenance, weight loss, and weight gain. The purpose of this lesson is to reinforce (in a group setting) the key concepts around what patients are  recommended to eat and how to apply these guidelines when away from home by planning and selecting Pritikin-friendly options. Patients will understand how calorie density may be adjusted for different weight management goals.  Mindful Eating  Clinical staff led group instruction and group discussion with PowerPoint presentation and patient guidebook. To enhance the learning environment the use of posters, models and videos may be added. Patients will briefly review the concepts of the Pritikin Eating Plan and the importance of low-calorie dense foods. The concept of mindful eating will be introduced as well as the importance of paying attention to internal hunger signals. Triggers for non-hunger eating and techniques for dealing with triggers will be explored. The purpose of this lesson is to provide patients with the opportunity to review the basic principles of the Pritikin Eating Plan, discuss the value of eating mindfully and how to measure internal cues of hunger and fullness using the Hunger Scale. Patients will also discuss reasons for non-hunger eating and learn strategies to use for controlling emotional eating.  Targeting Your Nutrition Priorities Clinical staff led group instruction and group discussion with PowerPoint presentation and patient guidebook. To enhance the learning environment the use of posters, models and videos may be added. Patients will learn how to determine their genetic susceptibility to disease by reviewing their family history. Patients will gain insight into the importance of diet as part of an overall healthy lifestyle in mitigating the impact of genetics and other environmental insults. The purpose of this lesson is to provide patients with the opportunity to assess their personal nutrition priorities by looking at their family history, their own health history and current risk factors. Patients will also be able to discuss ways of prioritizing and modifying the Pritikin  Eating Plan for their highest risk areas  Menu  Clinical staff led group instruction and group discussion with PowerPoint presentation and patient guidebook. To enhance the learning environment the use of posters, models and videos may be added. Using menus brought in from [employer], or printed from [employer], patients will apply the Pritikin dining out guidelines that were presented in the [employer] video. Patients will also be able to practice these guidelines in a variety of provided scenarios. The purpose of this lesson is to provide patients with the opportunity to practice hands-on learning of the Pritikin Dining Out guidelines with actual menus and practice scenarios.  Label Reading Clinical staff led group instruction and group discussion with PowerPoint presentation and patient guidebook. To enhance the learning environment the use of posters, models and videos may be added. Patients will review and discuss the Pritikin label reading guidelines presented in Pritikin's Label Reading Educational series video. Using fool labels brought in from local grocery stores and markets, patients will apply the label reading guidelines and determine if the packaged food meet the Pritikin guidelines. The purpose of this lesson is to provide patients with the opportunity  to review, discuss, and practice hands-on learning of the Pritikin Label Reading guidelines with actual packaged food labels. Cooking School  Pritikin's [employer] are designed to teach patients ways to prepare quick, simple, and affordable recipes at home. The importance of nutrition's role in chronic disease risk reduction is reflected in its emphasis in the overall Pritikin program. By learning how to prepare essential core Pritikin Eating Plan recipes, patients will increase control over what they eat; be able to customize the flavor of foods without the use of added salt, sugar, or fat; and  improve the quality of the food they consume. By learning a set of core recipes which are easily assembled, quickly prepared, and affordable, patients are more likely to prepare more healthy foods at home. These workshops focus on convenient breakfasts, simple entres, side dishes, and desserts which can be prepared with minimal effort and are consistent with nutrition recommendations for cardiovascular risk reduction. Cooking [employer] are taught by a Armed forces logistics/support/administrative officer (RD) who has been trained by the [employer]. The chef or RD has a clear understanding of the importance of minimizing - if not completely eliminating - added fat, sugar, and sodium in recipes. Throughout the series of Cooking School Workshop sessions, patients will learn about healthy ingredients and efficient methods of cooking to build confidence in their capability to prepare    Cooking School weekly topics:  Adding Flavor- Sodium-Free  Fast and Healthy Breakfasts  Powerhouse Plant-Based Proteins  Satisfying Salads and Dressings  Simple Sides and Sauces  International Cuisine-Spotlight on the [employer] Zones  Delicious Desserts  Savory Soups  [employer] - Meals in a Astronomer Appetizers and Snacks  Comforting Weekend Breakfasts  One-Pot Wonders   Fast Evening Meals  Landscape architect Your Pritikin Plate  WORKSHOPS   Healthy Mindset (Psychosocial):  Focused Goals, Sustainable Changes Clinical staff led group instruction and group discussion with PowerPoint presentation and patient guidebook. To enhance the learning environment the use of posters, models and videos may be added. Patients will be able to apply effective goal setting strategies to establish at least one personal goal, and then take consistent, meaningful action toward that goal. They will learn to identify common barriers to achieving personal goals and develop strategies to overcome them. Patients will  also gain an understanding of how our mind-set can impact our ability to achieve goals and the importance of cultivating a positive and growth-oriented mind-set. The purpose of this lesson is to provide patients with a deeper understanding of how to set and achieve personal goals, as well as the tools and strategies needed to overcome common obstacles which may arise along the way.  From Head to Heart: The Power of a Healthy Outlook  Clinical staff led group instruction and group discussion with PowerPoint presentation and patient guidebook. To enhance the learning environment the use of posters, models and videos may be added. Patients will be able to recognize and describe the impact of emotions and mood on physical health. They will discover the importance of self-care and explore self-care practices which may work for them. Patients will also learn how to utilize the 4 C's to cultivate a healthier outlook and better manage stress and challenges. The purpose of this lesson is to demonstrate to patients how a healthy outlook is an essential part of maintaining good health, especially as they continue their cardiac rehab journey.  Healthy Sleep for a Healthy Heart Clinical staff led group instruction  and group discussion with PowerPoint presentation and patient guidebook. To enhance the learning environment the use of posters, models and videos may be added. At the conclusion of this workshop, patients will be able to demonstrate knowledge of the importance of sleep to overall health, well-being, and quality of life. They will understand the symptoms of, and treatments for, common sleep disorders. Patients will also be able to identify daytime and nighttime behaviors which impact sleep, and they will be able to apply these tools to help manage sleep-related challenges. The purpose of this lesson is to provide patients with a general overview of sleep and outline the importance of quality sleep. Patients will  learn about a few of the most common sleep disorders. Patients will also be introduced to the concept of "sleep hygiene," and discover ways to self-manage certain sleeping problems through simple daily behavior changes. Finally, the workshop will motivate patients by clarifying the links between quality sleep and their goals of heart-healthy living.   Recognizing and Reducing Stress Clinical staff led group instruction and group discussion with PowerPoint presentation and patient guidebook. To enhance the learning environment the use of posters, models and videos may be added. At the conclusion of this workshop, patients will be able to understand the types of stress reactions, differentiate between acute and chronic stress, and recognize the impact that chronic stress has on their health. They will also be able to apply different coping mechanisms, such as reframing negative self-talk. Patients will have the opportunity to practice a variety of stress management techniques, such as deep abdominal breathing, progressive muscle relaxation, and/or guided imagery.  The purpose of this lesson is to educate patients on the role of stress in their lives and to provide healthy techniques for coping with it.  Learning Barriers/Preferences:  Learning Barriers/Preferences - 12/18/22 1031       Learning Barriers/Preferences   Learning Barriers Sight   wears glasses   Learning Preferences Audio;Computer/Internet;Group Instruction;Individual Instruction;Pictoral;Skilled Demonstration;Verbal Instruction;Video;Written Material             Education Topics:  Knowledge Questionnaire Score:  Knowledge Questionnaire Score - 12/18/22 1049       Knowledge Questionnaire Score   Pre Score 16/24             Core Components/Risk Factors/Patient Goals at Admission:  Personal Goals and Risk Factors at Admission - 12/18/22 1012       Core Components/Risk Factors/Patient Goals on Admission    Weight  Management Yes;Weight Loss    Intervention Weight Management: Develop a combined nutrition and exercise program designed to reach desired caloric intake, while maintaining appropriate intake of nutrient and fiber, sodium and fats, and appropriate energy expenditure required for the weight goal.;Weight Management: Provide education and appropriate resources to help participant work on and attain dietary goals.    Expected Outcomes Short Term: Continue to assess and modify interventions until short term weight is achieved;Long Term: Adherence to nutrition and physical activity/exercise program aimed toward attainment of established weight goal;Weight Loss: Understanding of general recommendations for a balanced deficit meal plan, which promotes 1-2 lb weight loss per week and includes a negative energy balance of [phone] kcal/d;Understanding recommendations for meals to include 15-35% energy as protein, 25-35% energy from fat, 35-60% energy from carbohydrates, less than 200mg  of dietary cholesterol, 20-35 gm of total fiber daily;Understanding of distribution of calorie intake throughout the day with the consumption of 4-5 meals/snacks    Hypertension Yes    Intervention Provide education on lifestyle modifcations  including regular physical activity/exercise, weight management, moderate sodium restriction and increased consumption of fresh fruit, vegetables, and low fat dairy, alcohol moderation, and smoking cessation.;Monitor prescription use compliance.    Expected Outcomes Long Term: Maintenance of blood pressure at goal levels.;Short Term: Continued assessment and intervention until BP is < 140/27mm HG in hypertensive participants. < 130/32mm HG in hypertensive participants with diabetes, heart failure or chronic kidney disease.    Lipids Yes    Intervention Provide education and support for participant on nutrition & aerobic/resistive exercise along with prescribed medications to achieve LDL 70mg , HDL  >40mg .    Expected Outcomes Long Term: Cholesterol controlled with medications as prescribed, with individualized exercise RX and with personalized nutrition plan. Value goals: LDL < 70mg , HDL > 40 mg.;Short Term: Participant states understanding of desired cholesterol values and is compliant with medications prescribed. Participant is following exercise prescription and nutrition guidelines.    Stress Yes    Intervention Offer individual and/or small group education and counseling on adjustment to heart disease, stress management and health-related lifestyle change. Teach and support self-help strategies.;Refer participants experiencing significant psychosocial distress to appropriate mental health specialists for further evaluation and treatment. When possible, include family members and significant others in education/counseling sessions.    Expected Outcomes Long Term: Emotional wellbeing is indicated by absence of clinically significant psychosocial distress or social isolation.;Short Term: Participant demonstrates changes in health-related behavior, relaxation and other stress management skills, ability to obtain effective social support, and compliance with psychotropic medications if prescribed.             Core Components/Risk Factors/Patient Goals Review:   Goals and Risk Factor Review     Row Name 12/28/22 0940 01/26/23 1602           Core Components/Risk Factors/Patient Goals Review   Personal Goals Review Weight Management/Obesity;Hypertension;Lipids;Stress Weight Management/Obesity;Hypertension;Lipids;Stress      Review Abrah started exercise at cardiac rehab on 12/25/22. Vital signs were stable. patient is deconditioned. Clista is off to a good start to  exercise at cardiac rehab. Vital signs have been  stable. Sarah-Jane is out this week due to death in her family.      Expected Outcomes Klarisa will continue to participate in cardiac rehab for exercise, nutrition and lifestyle  modifications. Tarisa will continue to participate in cardiac rehab for exercise, nutrition and lifestyle modifications.               Core Components/Risk Factors/Patient Goals at Discharge (Final Review):   Goals and Risk Factor Review - 01/26/23 1602       Core Components/Risk Factors/Patient Goals Review   Personal Goals Review Weight Management/Obesity;Hypertension;Lipids;Stress    Review Robin is off to a good start to  exercise at cardiac rehab. Vital signs have been  stable. Forrest is out this week due to death in her family.    Expected Outcomes Consuela will continue to participate in cardiac rehab for exercise, nutrition and lifestyle modifications.             ITP Comments:  ITP Comments     Row Name 12/18/22 1011 12/28/22 0917 01/26/23 1559       ITP Comments Dr. Armanda Magic medical director. Introduction to pritikin education/intensive cardiac rehab. Initial orientation packet reviewed with patient. 30 Day ITP Review. Loza started exercise on 12/25/22. Ysabella did fair with exercise for her fitness level as Jadwiga is deconditioned. 30 Day ITP Review. Keerica has increased her exercise tolerance. swati enjoys participating in cardiac rehab  when in attendance              Comments: See ITP comments.Thayer Headings RN BSN

## 2023-01-27 ENCOUNTER — Encounter (HOSPITAL_COMMUNITY): Payer: Medicare HMO

## 2023-01-29 ENCOUNTER — Encounter (HOSPITAL_COMMUNITY): Payer: Medicare HMO

## 2023-02-01 ENCOUNTER — Encounter (HOSPITAL_COMMUNITY): Payer: Medicare HMO

## 2023-02-03 ENCOUNTER — Encounter (HOSPITAL_COMMUNITY): Payer: Medicare HMO

## 2023-02-05 ENCOUNTER — Encounter (HOSPITAL_COMMUNITY): Payer: Medicare HMO

## 2023-02-08 ENCOUNTER — Encounter (HOSPITAL_COMMUNITY): Payer: Medicare HMO

## 2023-02-09 ENCOUNTER — Telehealth (HOSPITAL_COMMUNITY): Payer: Self-pay

## 2023-02-09 NOTE — Telephone Encounter (Signed)
Called in regards to cardiac rehab attendance. LM on VM

## 2023-02-10 ENCOUNTER — Encounter (HOSPITAL_COMMUNITY): Payer: Medicare HMO

## 2023-02-12 ENCOUNTER — Encounter (HOSPITAL_COMMUNITY): Payer: Medicare HMO

## 2023-02-15 ENCOUNTER — Encounter (HOSPITAL_COMMUNITY): Payer: Self-pay | Admitting: *Deleted

## 2023-02-15 ENCOUNTER — Encounter (HOSPITAL_COMMUNITY): Payer: Medicare HMO

## 2023-02-15 ENCOUNTER — Ambulatory Visit: Payer: Medicare HMO | Attending: Cardiology

## 2023-02-15 ENCOUNTER — Ambulatory Visit: Payer: Medicare HMO | Admitting: Nurse Practitioner

## 2023-02-15 DIAGNOSIS — I519 Heart disease, unspecified: Secondary | ICD-10-CM

## 2023-02-15 DIAGNOSIS — E785 Hyperlipidemia, unspecified: Secondary | ICD-10-CM | POA: Diagnosis not present

## 2023-02-15 DIAGNOSIS — Z955 Presence of coronary angioplasty implant and graft: Secondary | ICD-10-CM

## 2023-02-15 DIAGNOSIS — I119 Hypertensive heart disease without heart failure: Secondary | ICD-10-CM | POA: Insufficient documentation

## 2023-02-15 DIAGNOSIS — I2121 ST elevation (STEMI) myocardial infarction involving left circumflex coronary artery: Secondary | ICD-10-CM

## 2023-02-15 DIAGNOSIS — I081 Rheumatic disorders of both mitral and tricuspid valves: Secondary | ICD-10-CM | POA: Diagnosis not present

## 2023-02-15 LAB — ECHOCARDIOGRAM COMPLETE
Area-P 1/2: 3.26 cm2
MV M vel: 5.4 m/s
MV Peak grad: 116.6 mm[Hg]
S' Lateral: 2.7 cm

## 2023-02-15 NOTE — Progress Notes (Signed)
Ilsa attended 10 exercise and education classes between 12/18/22-01/13/23. [name] was discharged due to nonattendance. Sachet did have a slight increase in her met level the short time that she participated in cardiac rehab.Thayer Headings RN BSN

## 2023-02-17 ENCOUNTER — Encounter (HOSPITAL_COMMUNITY): Payer: Medicare HMO

## 2023-02-19 ENCOUNTER — Encounter (HOSPITAL_COMMUNITY): Payer: Medicare HMO

## 2023-02-22 ENCOUNTER — Other Ambulatory Visit (HOSPITAL_COMMUNITY): Payer: Self-pay

## 2023-02-22 ENCOUNTER — Encounter (HOSPITAL_COMMUNITY): Payer: Medicare HMO

## 2023-02-22 ENCOUNTER — Other Ambulatory Visit: Payer: Self-pay

## 2023-02-24 ENCOUNTER — Encounter (HOSPITAL_COMMUNITY): Payer: Medicare HMO

## 2023-02-25 ENCOUNTER — Telehealth: Payer: Self-pay

## 2023-02-25 NOTE — Telephone Encounter (Addendum)
Called patient regarding results. Patient had understanding of results.----- Message from Joylene Grapes sent at 02/22/2023  7:51 AM EST ----- Echocardiogram shows normal heart pumping function, there is mild leakiness of the mitral valve, improved compared to prior echocardiogram.  Overall, a good report.  Continue current medications and follow-up as planned.  Thank you-EM

## 2023-02-26 ENCOUNTER — Encounter (HOSPITAL_COMMUNITY): Payer: Medicare HMO

## 2023-03-01 ENCOUNTER — Encounter (HOSPITAL_COMMUNITY): Payer: Medicare HMO

## 2023-03-05 ENCOUNTER — Encounter (HOSPITAL_COMMUNITY): Payer: Medicare HMO

## 2023-03-08 ENCOUNTER — Encounter (HOSPITAL_COMMUNITY): Payer: Medicare HMO

## 2023-03-19 ENCOUNTER — Ambulatory Visit: Payer: Medicare HMO | Admitting: Nurse Practitioner

## 2023-04-14 ENCOUNTER — Other Ambulatory Visit: Payer: Self-pay

## 2023-04-14 ENCOUNTER — Ambulatory Visit: Payer: 59 | Attending: Nurse Practitioner | Admitting: Nurse Practitioner

## 2023-04-14 ENCOUNTER — Encounter: Payer: Self-pay | Admitting: Nurse Practitioner

## 2023-04-14 VITALS — BP 150/80 | HR 82 | Ht 66.0 in | Wt 182.0 lb

## 2023-04-14 DIAGNOSIS — I10 Essential (primary) hypertension: Secondary | ICD-10-CM | POA: Diagnosis not present

## 2023-04-14 DIAGNOSIS — I340 Nonrheumatic mitral (valve) insufficiency: Secondary | ICD-10-CM | POA: Diagnosis not present

## 2023-04-14 DIAGNOSIS — E785 Hyperlipidemia, unspecified: Secondary | ICD-10-CM | POA: Diagnosis not present

## 2023-04-14 DIAGNOSIS — I519 Heart disease, unspecified: Secondary | ICD-10-CM

## 2023-04-14 DIAGNOSIS — I493 Ventricular premature depolarization: Secondary | ICD-10-CM

## 2023-04-14 DIAGNOSIS — Z86718 Personal history of other venous thrombosis and embolism: Secondary | ICD-10-CM

## 2023-04-14 DIAGNOSIS — I2510 Atherosclerotic heart disease of native coronary artery without angina pectoris: Secondary | ICD-10-CM | POA: Diagnosis not present

## 2023-04-14 MED ORDER — CARVEDILOL 12.5 MG PO TABS
12.5000 mg | ORAL_TABLET | Freq: Two times a day (BID) | ORAL | 3 refills | Status: DC
  Filled 2023-04-14: qty 180, 90d supply, fill #0

## 2023-04-14 MED ORDER — NITROGLYCERIN 0.4 MG SL SUBL
0.4000 mg | SUBLINGUAL_TABLET | SUBLINGUAL | 2 refills | Status: AC | PRN
  Filled 2023-04-14: qty 25, 30d supply, fill #0
  Filled 2023-05-18: qty 25, 30d supply, fill #1

## 2023-04-14 NOTE — Patient Instructions (Addendum)
 Medication Instructions:  Stop Asprin 81 mg Increase Carvedilol  12.5 mg twice daily  *If you need a refill on your cardiac medications before your next appointment, please call your pharmacy*   Lab Work: Fasting lipid panel, CMET, CBC at your convenience   Testing/Procedures: NONE ordered at this time of appointment   Follow-Up: At [facility], you and your health needs are our priority.  As part of our continuing mission to provide you with exceptional heart care, we have created designated Provider Care Teams.  These Care Teams include your primary Cardiologist (physician) and Advanced Practice Providers (APPs -  Physician Assistants and Nurse Practitioners) who all work together to provide you with the care you need, when you need it.  We recommend signing up for the patient portal called MyChart.  Sign up information is provided on this After Visit Summary.  MyChart is used to connect with patients for Virtual Visits (Telemedicine).  Patients are able to view lab/test results, encounter notes, upcoming appointments, etc.  Non-urgent messages can be sent to your provider as well.   To learn more about what you can do with MyChart, go to [email or web address].    Your next appointment:    2-3 months Krista Braver NP) 6 months (Dr. Anner)  Provider:   Alm Anner, MD  or Damien Braver, NP        Other Instructions Monitor Blood pressure. Goal blood pressure is 130/80 or less.  Monitor Heart rate and report heart rate consistently less than 50 beats per min.

## 2023-04-14 NOTE — Progress Notes (Signed)
 Office Visit    Patient Name: [name] Date of Encounter: 04/14/2023  Primary Care Provider:  Phyllis Jereld BROCKS, NP Primary Cardiologist:  Alm Clay, MD  Chief Complaint    63 year old female with a history of CAD s/p STEMI, DES-LCx in 11/2022, DVT, hypertension, hyperlipidemia, and tobacco use who presents for follow-up related to CAD.  Past Medical History    Past Medical History:  Diagnosis Date   DVT (deep venous thrombosis) (HCC)    Headache(784.0)    otc meds prn    Heart attack (HCC) 11/22/2022   History of hysterectomy 2014   Hyperlipidemia    Hypertension 09/2018   Plantar warts 08/2019   SVD (spontaneous vaginal delivery)    x 5   Vitamin D  deficiency 09/2018   Past Surgical History:  Procedure Laterality Date   ABDOMINAL HYSTERECTOMY  04/06/2012   Procedure: HYSTERECTOMY ABDOMINAL;  Surgeon: Charlie CHRISTELLA Croak, MD;  Location: [facility];  Service: Gynecology;  Laterality: N/A;   BREAST SURGERY     benign turmor removed   CORONARY/GRAFT ACUTE MI REVASCULARIZATION N/A 11/21/2022   Procedure: Coronary/Graft Acute MI Revascularization;  Surgeon: Clay Alm ORN, MD;  Location: [facility] IN[facility]ASIVE CV [facility];  Service: Cardiovascular;  Laterality: N/A;   SALPINGOOPHORECTOMY  04/06/2012   Procedure: SALPINGO OOPHORECTOMY;  Surgeon: Charlie CHRISTELLA Croak, MD;  Location: [facility];  Service: Gynecology;  Laterality: Bilateral;   TUBAL LIGATION      Allergies  No Known Allergies   Labs/Other Studies Reviewed    The following studies were reviewed today:  Cardiac Studies & Procedures   CARDIAC CATHETERIZATION  CARDIAC CATHETERIZATION 11/21/2022  Narrative   Dist Cx lesion is 100% stenosed with 100% stenosed side branch in 4th Mrg.   A drug-eluting stent was successfully placed from dLCx into 4th Mrg using a STENT ONYX FRONTIER 2.5X26 -> deployed to 2.75 mm.  Post intervention, there is a 0% residual stenosis from LCx into 4thMrg(LPL1).   --------------------   Prox  RCA to Mid RCA lesion is 40% stenosed.   --------------------   In the absence of any other complications or medical issues, we expect the patient to be ready for discharge from an interventional cardiology perspective on 11/22/2022.   Recommend to resume Rivaroxaban , at currently prescribed dose and frequency on 11/22/2022.   Recommend concurrent antiplatelet therapy of Aspirin  81 mg for 1 month and Clopidogrel  75mg  daily for 12 months .  POST-CATH DIAGNOSES Severe single-vessel CAD-100% thrombotic/ulcerated occlusion (TIMI 0 flow) of mid LCx as culprit lesion for Inferolateral STEMI Treated successfully with Onyx frontier DES 2.5 mm x 26 mm deployed to 2.75 mm, restoring TIMI-3 flow Otherwise minimal mild disease in the RCA. Normal LVEF (55 to 65%) and moderately elevated LVEDP of 24 mmHg.   RECOMMENDATIONS Temporary ICU admission for monitoring; check 2D Echo and cycle troponins   In the absence of any other complications or medical issues, we expect the patient to be ready for discharge from an interventional cardiology perspective on 11/22/2022. Restart home medications including amlodipine  and carvedilol    Recommend to resume Rivaroxaban , at currently prescribed dose and frequency on 11/22/2022.   Recommend concurrent antiplatelet therapy of Aspirin  81 mg for 1 month and Clopidogrel  75mg  daily for 12 months .     Alm Clay, MD  Findings Coronary Findings Diagnostic  Dominance: Right  Left Anterior Descending  Second Diagonal Branch Vessel is small in size.  Left Circumflex Vessel is large. Dist Cx lesion is 100% stenosed with 100%  stenosed side branch in 4th Mrg. The lesion is distal to major branch, eccentric, irregular, thrombotic and ulcerative.  Third Obtuse Marginal Branch Vessel is small in size.  Fourth Obtuse Marginal Branch Vessel is moderate in size. Courses of posterolateral branch.  Right Coronary Artery Prox RCA to Mid RCA lesion is 40%  stenosed.  Right Ventricular Branch Vessel is small in size.  Right Posterior Descending Artery Vessel is small in size.  First Right Posterolateral Branch Vessel is small in size.  Intervention  Dist Cx lesion with side branch in 4th Mrg Stent - Main Branch Lesion length:  25 mm. CATH LAUNCHER 6FR EBU3.5 guide catheter was inserted. Lesion crossed with guidewire using a WIRE RUNTHROUGH .[id number]. Pre-stent angioplasty was performed using a BALLN EMERGE MR 2.5X15. Maximum pressure:  6 atm. Inflation time: 20 sec. 3 separate inflations A drug-eluting stent was successfully placed using a STENT ONYX FRONTIER 2.5X26. Stent goes from AV groove LCx into OM/LPL 1 Maximum pressure: 18 atm. Inflation time: 30 sec. Stent strut is well apposed. 2.75 mm Post-stent angioplasty was not performed. Deployed to high ATM 1 stent to cover both lesions Stent - Side Branch Lesion length:  25 mm. A stent was successfully placed. Post-Intervention Lesion Assessment The intervention was successful. Pre-interventional TIMI flow is 0. Post-intervention TIMI flow is 3. Treated lesion length:  26 mm. No complications occurred at this lesion. There is a 0% residual stenosis in the main branch post intervention. There is a 0% residual stenosis in the side branch post intervention.    ECHOCARDIOGRAM  ECHOCARDIOGRAM COMPLETE 02/15/2023  Narrative ECHOCARDIOGRAM REPORT    Patient Name:   [name] Date of Exam: 02/15/2023 Medical Rec #:  [id number]       Height:       66.0 in Accession #:    [id number]      Weight:       184.2 lb Date of Birth:  1961-01-20       BSA:          1.931 m Patient Age:    62 years        BP:           130/82 mmHg Patient Gender: F               HR:           65 bpm. Exam Location:  [address]  Procedure: 2D Echo, 3D Echo, Cardiac Doppler, Color Doppler and Strain Analysis  Indications:    I51.9 LV Dysfunction  History:        Patient has prior history of Echocardiogram  examinations, most recent 11/21/2022. Risk Factors:Hypertension and Dyslipidemia.  Sonographer:    NaTashia Rodgers-Jones RDCS Referring Phys: [id number] Nailani Full C Devantae Babe  IMPRESSIONS   1. Left ventricular ejection fraction, by estimation, is 60 to 65%. The left ventricle has normal function. The left ventricle has no regional wall motion abnormalities. There is mild concentric left ventricular hypertrophy. Left ventricular diastolic parameters were normal. GLS -25.2%. 2. Right ventricular systolic function is normal. The right ventricular size is normal. 3. The mitral valve is normal in structure. Mild mitral valve regurgitation. No evidence of mitral stenosis. 4. Tricuspid valve regurgitation is moderate. 5. The aortic valve is normal in structure. Aortic valve regurgitation is not visualized. No aortic stenosis is present. 6. The inferior vena cava is normal in size with greater than 50% respiratory variability, suggesting right atrial pressure of 3 mmHg.  FINDINGS Left Ventricle: Left  ventricular ejection fraction, by estimation, is 60 to 65%. The left ventricle has normal function. The left ventricle has no regional wall motion abnormalities. The left ventricular internal cavity size was normal in size. There is mild concentric left ventricular hypertrophy. Left ventricular diastolic parameters were normal.  Right Ventricle: The right ventricular size is normal. No increase in right ventricular wall thickness. Right ventricular systolic function is normal.  Left Atrium: Left atrial size was normal in size.  Right Atrium: Right atrial size was normal in size.  Pericardium: There is no evidence of pericardial effusion.  Mitral Valve: The mitral valve is normal in structure. Mild mitral valve regurgitation. No evidence of mitral valve stenosis.  Tricuspid Valve: The tricuspid valve is normal in structure. Tricuspid valve regurgitation is moderate . No evidence of tricuspid stenosis.  Aortic  Valve: The aortic valve is normal in structure. Aortic valve regurgitation is not visualized. No aortic stenosis is present.  Pulmonic Valve: The pulmonic valve was normal in structure. Pulmonic valve regurgitation is trivial. No evidence of pulmonic stenosis.  Aorta: The aortic root is normal in size and structure.  Venous: The inferior vena cava is normal in size with greater than 50% respiratory variability, suggesting right atrial pressure of 3 mmHg.  IAS/Shunts: No atrial level shunt detected by color flow Doppler.   LEFT VENTRICLE PLAX 2D LVIDd:         4.00 cm   Diastology LVIDs:         2.70 cm   LV e' medial:    6.80 cm/s LV PW:         1.00 cm   LV E/e' medial:  13.6 LV IVS:        1.00 cm   LV e' lateral:   9.62 cm/s LVOT diam:     1.80 cm   LV E/e' lateral: 9.6 LV SV:         69 LV SV Index:   36        2D Longitudinal Strain LVOT Area:     2.54 cm  2D Strain GLS (A2C):   -25.7 % 2D Strain GLS (A3C):   -25.0 % 2D Strain GLS (A4C):   -24.9 % 2D Strain GLS Avg:     -25.2 %  3D Volume EF: 3D EF:        58 % LV EDV:       149 ml LV ESV:       62 ml LV SV:        87 ml  RIGHT VENTRICLE             IVC RV Basal diam:  5.10 cm     IVC diam: 0.90 cm RV S prime:     18.80 cm/s TAPSE (M-mode): 2.9 cm  LEFT ATRIUM             Index        RIGHT ATRIUM           Index LA diam:        4.30 cm 2.23 cm/m   RA Area:     16.60 cm LA Vol (A2C):   61.3 ml 31.74 ml/m  RA Volume:   52.80 ml  27.34 ml/m LA Vol (A4C):   37.5 ml 19.42 ml/m LA Biplane Vol: 48.6 ml 25.17 ml/m AORTIC VALVE LVOT Vmax:   127.50 cm/s LVOT Vmean:  79.700 cm/s LVOT VTI:    0.272 m  AORTA Ao Root diam: 3.00 cm  Ao Asc diam:  3.40 cm  MITRAL VALVE                TRICUSPID VALVE MV Area (PHT): 3.26 cm     TR Peak grad:   26.0 mmHg MV Decel Time: 233 msec     TR Vmax:        255.00 cm/s MR Peak grad: 116.6 mmHg MR Vmax:      540.00 cm/s   SHUNTS MV E velocity: 92.40 cm/s   Systemic VTI:  0.27  m MV A velocity: 104.00 cm/s  Systemic Diam: 1.80 cm MV E/A ratio:  0.89  Aditya Sabharwal Electronically signed by Ria Commander Signature Date/Time: 02/15/2023/2:19:00 PM    Final            Recent Labs: 05/28/2022: ALT 10 11/21/2022: B Natriuretic Peptide 48.4; TSH 1.796 11/22/2022: BUN 11; Creatinine, Ser 0.79; Hemoglobin 11.8; Platelets 208; Potassium 3.4; Sodium 141  Recent Lipid Panel    Component Value Date/Time   CHOL 149 11/22/2022 0507   CHOL 211 (H) 09/14/2018 0916   TRIG 63 11/22/2022 0507   HDL 24 (L) 11/22/2022 0507   HDL 42 09/14/2018 0916   CHOLHDL 6.2 11/22/2022 0507   VLDL 13 11/22/2022 0507   LDLCALC 112 (H) 11/22/2022 0507   LDLCALC 210 (H) 05/28/2022 1138    History of Present Illness    63 year old female with the above past medical history including CAD s/p STEMI, DES-LCx in 11/2022, DVT, hypertension, hyperlipidemia, and tobacco use.   She has a history of prior DVT on Xarelto .  She was hospitalized in 11/2022 in the setting of STEMI.  She underwent emergent cardiac catheterization which revealed 100% stenosis of the distal LCx into the fourth marginal, s/p DES, 40% proximal to mid RCA stenosis, EF 55 to 65%.  Xarelto  was resumed and she was advised to begin concurrent DAPT with aspirin  x 1 month and Plavix  x 12 months.  Echocardiogram noted EF 65 to 70%, LV hyperdynamic function, RWMA, moderate hypokinesis of the left ventricular, basal inferior wall and inferolateral wall, normal RV systolic function, mildly elevated PASP, moderate mitral valve regurgitation. It was felt that this was likely due to an ischemic mechanism and could be reassessed as an outpatient.  She was started on carvedilol  and Crestor .  She was last seen in the office on 12/01/2022 and was stable from a cardiac standpoint.  She denied symptoms concerning for angina.  She noted that she was scheduled to have back surgery prior to her hospitalization in the setting of chronic back pain.  It  was noted that she should wait at least 6 months if not a year prior to any surgeries due to the need for uninterrupted DAPT.  Repeat echocardiogram in 02/2023 showed EF 60 to 65%, normal LV function, no RWMA, mild concentric LVH, normal RV systolic function, mild mitral valve regurgitation.   She presents today for follow-up accompanied by her daughter.  Since her last visit she has done well from a cardiac standpoint.  Her BP has been intermittently elevated.  She denies symptoms concerning for angina.  She continues to deal with chronic back pain. Overall, from a cardiac standpoint, she reports feeling well.  Home Medications    Current Outpatient Medications  Medication Sig Dispense Refill   carvedilol  (COREG ) 12.5 MG tablet Take 1 tablet (12.5 mg total) by mouth 2 (two) times daily with a meal. 180 tablet 3   clopidogrel  (PLAVIX ) 75 MG tablet Take 1 tablet (75  mg total) by mouth daily with breakfast. 90 tablet 3   hydrochlorothiazide  (HYDRODIURIL ) 12.5 MG tablet Take 1 tablet (12.5 mg total) by mouth daily. 90 tablet 3   NARCAN  4 MG/0.1ML LIQD nasal spray kit      oxyCODONE -acetaminophen  (PERCOCET) 10-325 MG tablet Take 1 tablet by mouth 2 (two) times daily as needed. 60 tablet 0   rivaroxaban  (XARELTO ) 10 MG TABS tablet Take 1 tablet (10 mg total) by mouth daily. 30 tablet 3   rosuvastatin  (CRESTOR ) 20 MG tablet Take 1 tablet (20 mg total) by mouth daily. 90 tablet 3   tiZANidine  (ZANAFLEX ) 2 MG tablet Take 1 tablet (2 mg total) by mouth every 8 (eight) hours as needed for muscle spasms. 90 tablet 3   Vitamin D , Ergocalciferol , (DRISDOL ) 1.25 MG (50000 UNIT) CAPS capsule Take 1 capsule (50,000 Units total) by mouth every 7 (seven) days. 5 capsule 3   nitroGLYCERIN  (NITROSTAT ) 0.4 MG SL tablet Place 1 tablet (0.4 mg total) under the tongue every 5 (five) minutes x 3 doses as needed for chest pain. 25 tablet 2   No current facility-administered medications for this visit.     Review of  Systems    She denies chest pain, palpitations, dyspnea, pnd, orthopnea, n, v, dizziness, syncope, edema, weight gain, or early satiety. All other systems reviewed and are otherwise negative except as noted above.   Physical Exam    VS:  BP (!) 150/80 (BP Location: Left Arm, Patient Position: Sitting, Cuff Size: Normal)   Pulse 82   Ht 5' 6 (1.676 m)   Wt 182 lb (82.6 kg)   LMP 06/13/2011   SpO2 95%   BMI 29.38 kg/m   GEN: Well nourished, well developed, in no acute distress. HEENT: normal. Neck: Supple, no JVD, carotid bruits, or masses. Cardiac: RRR, no murmurs, rubs, or gallops. No clubbing, cyanosis, edema.  Radials/DP/PT 2+ and equal bilaterally.  Respiratory:  Respirations regular and unlabored, clear to auscultation bilaterally. GI: Soft, nontender, nondistended, BS + x 4. MS: no deformity or atrophy. Skin: warm and dry, no rash. Neuro:  Strength and sensation are intact. Psych: Normal affect.  Accessory Clinical Findings    ECG personally reviewed by me today -    - no EKG in office today.    Lab Results  Component Value Date   WBC 5.8 11/22/2022   HGB 11.8 (L) 11/22/2022   HCT 36.4 11/22/2022   MCV 93.3 11/22/2022   PLT 208 11/22/2022   Lab Results  Component Value Date   CREATININE 0.79 11/22/2022   BUN 11 11/22/2022   NA 141 11/22/2022   K 3.4 (L) 11/22/2022   CL 112 (H) 11/22/2022   CO2 21 (L) 11/22/2022   Lab Results  Component Value Date   ALT 10 05/28/2022   AST 12 05/28/2022   ALKPHOS 85 09/14/2018   BILITOT 0.5 05/28/2022   Lab Results  Component Value Date   CHOL 149 11/22/2022   HDL 24 (L) 11/22/2022   LDLCALC 112 (H) 11/22/2022   TRIG 63 11/22/2022   CHOLHDL 6.2 11/22/2022    Lab Results  Component Value Date   HGBA1C 5.7 (H) 11/21/2022    Assessment & Plan    1. CAD: S/p STEMI, DES-LCx in 11/2022.  She had residual 40% proximal to mid RCA stenosis.   Stable with no anginal symptoms. No indication for ischemic evaluation. Per Dr.  Anner regarding request to hold DAPT, Unless it is an emergency surgery, I would  wait at least 6 months if not a year. 6 months would be less than favorable but the father out we can get the better. Continue Plavix , carvedilol , amlodipine , and Crestor .   2.  PVCs: Noted on prior EKGs, asymptomatic.  Continue carvedilol  as below.   3. History of DVT: On chronic Xarelto .  Managed per PCP.  Will update CBC.   4. Hypertension: BP elevated in office today.  He has noted intermittently elevated BP at home.  Will increase carvedilol  to 12.5 mg twice daily.  Continue to monitor BP greater than 130/80, monitor heart rate and report heart rate consistently less than 50 bpm. Otherwise, continue current antihypertensive regimen.   5. Hyperlipidemia: LDL was 112 in 11/2022. She is overdue for repeat fasting lipid panel.  Will update fasting lipids, CMET.  Continue Crestor .   6. LV dysfunction/mitral valve regurgitation: Echocardiogram noted EF 65 to 70%, LV hyperdynamic function, RWMA, moderate hypokinesis of the left ventricular, basal inferior wall and inferolateral wall, normal RV systolic function, mildly elevated PASP, moderate mitral valve regurgitation. Repeat echocardiogram in 02/2023 showed EF 60 to 65%, normal LV function, no RWMA, mild concentric LVH, normal RV systolic function, mild mitral valve regurgitation. Euvolemic and well compensated on exam.  No indication for diuretic at this time.     7. Tobacco use: She is no longer smoking.  I congratulated her on this.  Ongoing cessation advised.    8. Disposition: Follow-up in 2-3 months with APP,  6 months with Dr. Anner.   Damien JAYSON Braver, NP 04/14/2023, 2:38 PM

## 2023-04-19 ENCOUNTER — Other Ambulatory Visit: Payer: Self-pay | Admitting: Adult Health

## 2023-04-19 ENCOUNTER — Other Ambulatory Visit: Payer: Self-pay

## 2023-04-19 DIAGNOSIS — Z86718 Personal history of other venous thrombosis and embolism: Secondary | ICD-10-CM

## 2023-04-19 MED ORDER — RIVAROXABAN 10 MG PO TABS
10.0000 mg | ORAL_TABLET | Freq: Every day | ORAL | 3 refills | Status: DC
  Filled 2023-04-19: qty 30, 30d supply, fill #0
  Filled 2023-05-18: qty 30, 30d supply, fill #1
  Filled 2023-06-28: qty 30, 30d supply, fill #2
  Filled 2023-08-23: qty 30, 30d supply, fill #3

## 2023-04-22 ENCOUNTER — Other Ambulatory Visit: Payer: Self-pay

## 2023-04-29 DIAGNOSIS — G894 Chronic pain syndrome: Secondary | ICD-10-CM | POA: Diagnosis not present

## 2023-04-29 DIAGNOSIS — M5416 Radiculopathy, lumbar region: Secondary | ICD-10-CM | POA: Diagnosis not present

## 2023-05-04 ENCOUNTER — Other Ambulatory Visit: Payer: Self-pay | Admitting: Acute Care

## 2023-05-04 DIAGNOSIS — Z122 Encounter for screening for malignant neoplasm of respiratory organs: Secondary | ICD-10-CM

## 2023-05-04 DIAGNOSIS — Z87891 Personal history of nicotine dependence: Secondary | ICD-10-CM

## 2023-05-04 DIAGNOSIS — F17210 Nicotine dependence, cigarettes, uncomplicated: Secondary | ICD-10-CM

## 2023-05-18 ENCOUNTER — Other Ambulatory Visit: Payer: Self-pay

## 2023-05-18 ENCOUNTER — Other Ambulatory Visit: Payer: Self-pay | Admitting: Adult Health

## 2023-05-18 ENCOUNTER — Ambulatory Visit
Admission: RE | Admit: 2023-05-18 | Discharge: 2023-05-18 | Disposition: A | Discharge status: Home / Self Care | Payer: Medicare HMO | Source: Ambulatory Visit | Attending: Acute Care | Admitting: Acute Care

## 2023-05-18 DIAGNOSIS — Z122 Encounter for screening for malignant neoplasm of respiratory organs: Secondary | ICD-10-CM

## 2023-05-18 DIAGNOSIS — F17210 Nicotine dependence, cigarettes, uncomplicated: Secondary | ICD-10-CM

## 2023-05-18 DIAGNOSIS — E559 Vitamin D deficiency, unspecified: Secondary | ICD-10-CM

## 2023-05-18 DIAGNOSIS — Z87891 Personal history of nicotine dependence: Secondary | ICD-10-CM

## 2023-05-18 MED ORDER — VITAMIN D (ERGOCALCIFEROL) 1.25 MG (50000 UNIT) PO CAPS
50000.0000 [IU] | ORAL_CAPSULE | ORAL | 3 refills | Status: AC
Start: 2023-05-18 — End: ?
  Filled 2023-05-18: qty 5, 35d supply, fill #0
  Filled 2023-06-28: qty 4, 28d supply, fill #1
  Filled 2023-08-23: qty 4, 28d supply, fill #2
  Filled 2023-09-13: qty 4, 28d supply, fill #3
  Filled 2023-10-11 – 2023-10-21 (×2): qty 3, 21d supply, fill #4

## 2023-05-19 ENCOUNTER — Other Ambulatory Visit: Payer: Self-pay | Admitting: Student

## 2023-05-19 ENCOUNTER — Other Ambulatory Visit: Payer: Self-pay

## 2023-05-19 MED ORDER — CARVEDILOL 12.5 MG PO TABS: 12.5000 mg | ORAL_TABLET | Freq: Two times a day (BID) | ORAL | 3 refills | Status: AC

## 2023-06-08 ENCOUNTER — Other Ambulatory Visit (HOSPITAL_COMMUNITY): Payer: Self-pay

## 2023-06-08 DIAGNOSIS — Z1321 Encounter for screening for nutritional disorder: Secondary | ICD-10-CM | POA: Diagnosis not present

## 2023-06-08 DIAGNOSIS — I110 Hypertensive heart disease with heart failure: Secondary | ICD-10-CM | POA: Diagnosis not present

## 2023-06-08 DIAGNOSIS — I509 Heart failure, unspecified: Secondary | ICD-10-CM | POA: Diagnosis not present

## 2023-06-08 DIAGNOSIS — J8410 Pulmonary fibrosis, unspecified: Secondary | ICD-10-CM | POA: Diagnosis not present

## 2023-06-08 DIAGNOSIS — J432 Centrilobular emphysema: Secondary | ICD-10-CM | POA: Diagnosis not present

## 2023-06-08 DIAGNOSIS — Z79899 Other long term (current) drug therapy: Secondary | ICD-10-CM | POA: Diagnosis not present

## 2023-06-08 DIAGNOSIS — R1011 Right upper quadrant pain: Secondary | ICD-10-CM | POA: Diagnosis not present

## 2023-06-08 DIAGNOSIS — D649 Anemia, unspecified: Secondary | ICD-10-CM | POA: Diagnosis not present

## 2023-06-08 MED ORDER — CARVEDILOL 12.5 MG PO TABS
12.5000 mg | ORAL_TABLET | Freq: Two times a day (BID) | ORAL | 3 refills | Status: DC
  Filled 2023-06-08 – 2023-08-24 (×2): qty 180, 90d supply, fill #0

## 2023-06-08 MED ORDER — HYDROCHLOROTHIAZIDE 12.5 MG PO TABS
12.5000 mg | ORAL_TABLET | Freq: Every morning | ORAL | 3 refills | Status: DC
  Filled 2023-06-08 – 2023-06-14 (×2): qty 90, 90d supply, fill #0
  Filled 2023-09-06: qty 90, 90d supply, fill #1

## 2023-06-09 ENCOUNTER — Other Ambulatory Visit: Payer: Self-pay | Admitting: Registered Nurse

## 2023-06-09 DIAGNOSIS — R109 Unspecified abdominal pain: Secondary | ICD-10-CM

## 2023-06-11 ENCOUNTER — Other Ambulatory Visit (HOSPITAL_COMMUNITY): Payer: Self-pay

## 2023-06-11 MED ORDER — ROSUVASTATIN CALCIUM 40 MG PO TABS
40.0000 mg | ORAL_TABLET | Freq: Every day | ORAL | 3 refills | Status: AC
  Filled 2023-06-11 – 2023-06-14 (×2): qty 90, 90d supply, fill #0
  Filled 2023-09-06: qty 90, 90d supply, fill #1
  Filled 2024-03-17: qty 90, 90d supply, fill #2

## 2023-06-14 ENCOUNTER — Other Ambulatory Visit (HOSPITAL_COMMUNITY): Payer: Self-pay

## 2023-06-14 ENCOUNTER — Other Ambulatory Visit: Payer: Self-pay

## 2023-06-14 DIAGNOSIS — Z122 Encounter for screening for malignant neoplasm of respiratory organs: Secondary | ICD-10-CM

## 2023-06-14 DIAGNOSIS — F17210 Nicotine dependence, cigarettes, uncomplicated: Secondary | ICD-10-CM

## 2023-06-14 DIAGNOSIS — Z87891 Personal history of nicotine dependence: Secondary | ICD-10-CM

## 2023-06-21 ENCOUNTER — Encounter: Payer: 59 | Admitting: Family

## 2023-06-21 NOTE — Progress Notes (Signed)
   This encounter was created in error - please disregard. No show

## 2023-06-28 ENCOUNTER — Other Ambulatory Visit: Payer: Self-pay

## 2023-06-29 ENCOUNTER — Ambulatory Visit
Admission: RE | Admit: 2023-06-29 | Discharge: 2023-06-29 | Disposition: A | Discharge status: Home / Self Care | Source: Ambulatory Visit | Attending: Registered Nurse

## 2023-06-29 ENCOUNTER — Other Ambulatory Visit: Payer: Self-pay

## 2023-06-29 DIAGNOSIS — R1011 Right upper quadrant pain: Secondary | ICD-10-CM | POA: Diagnosis not present

## 2023-06-29 DIAGNOSIS — R109 Unspecified abdominal pain: Secondary | ICD-10-CM

## 2023-06-29 MED ORDER — IOPAMIDOL (ISOVUE-300) INJECTION 61%
100.0000 mL | Freq: Once | INTRAVENOUS | Status: AC | PRN
  Administered 2023-06-29: 100 mL via INTRAVENOUS

## 2023-07-05 ENCOUNTER — Ambulatory Visit: Payer: Medicare HMO | Admitting: Nurse Practitioner

## 2023-07-09 ENCOUNTER — Encounter: Payer: Self-pay | Admitting: Nurse Practitioner

## 2023-07-09 ENCOUNTER — Ambulatory Visit: Payer: Self-pay | Attending: Nurse Practitioner | Admitting: Nurse Practitioner

## 2023-07-09 ENCOUNTER — Other Ambulatory Visit (HOSPITAL_COMMUNITY): Payer: Self-pay

## 2023-07-09 ENCOUNTER — Other Ambulatory Visit: Payer: Self-pay

## 2023-07-09 ENCOUNTER — Encounter (HOSPITAL_COMMUNITY): Payer: Self-pay

## 2023-07-09 VITALS — BP 130/86 | HR 82 | Ht 65.0 in | Wt 188.3 lb

## 2023-07-09 DIAGNOSIS — I493 Ventricular premature depolarization: Secondary | ICD-10-CM | POA: Diagnosis not present

## 2023-07-09 DIAGNOSIS — I2510 Atherosclerotic heart disease of native coronary artery without angina pectoris: Secondary | ICD-10-CM | POA: Diagnosis not present

## 2023-07-09 DIAGNOSIS — Z86718 Personal history of other venous thrombosis and embolism: Secondary | ICD-10-CM

## 2023-07-09 DIAGNOSIS — I519 Heart disease, unspecified: Secondary | ICD-10-CM | POA: Diagnosis not present

## 2023-07-09 DIAGNOSIS — I340 Nonrheumatic mitral (valve) insufficiency: Secondary | ICD-10-CM

## 2023-07-09 DIAGNOSIS — I10 Essential (primary) hypertension: Secondary | ICD-10-CM | POA: Diagnosis not present

## 2023-07-09 DIAGNOSIS — E785 Hyperlipidemia, unspecified: Secondary | ICD-10-CM | POA: Diagnosis not present

## 2023-07-09 MED ORDER — ALBUTEROL SULFATE HFA 108 (90 BASE) MCG/ACT IN AERS
2.0000 | INHALATION_SPRAY | RESPIRATORY_TRACT | 3 refills | Status: DC | PRN
  Filled 2023-07-09: qty 20.1, 51d supply, fill #0
  Filled 2023-09-16: qty 20.1, 51d supply, fill #1
  Filled 2023-11-16: qty 20.1, 51d supply, fill #2
  Filled 2023-12-29: qty 20.1, 51d supply, fill #3

## 2023-07-09 MED ORDER — SPIRIVA RESPIMAT 2.5 MCG/ACT IN AERS
2.0000 | INHALATION_SPRAY | Freq: Every day | RESPIRATORY_TRACT | 3 refills | Status: DC
  Filled 2023-07-09: qty 12, 90d supply, fill #0
  Filled 2023-07-22: qty 4, 30d supply, fill #0
  Filled 2023-08-23: qty 4, 30d supply, fill #1
  Filled 2023-09-15: qty 4, 30d supply, fill #2
  Filled 2023-10-15: qty 4, 30d supply, fill #3
  Filled 2023-11-15: qty 4, 30d supply, fill #4
  Filled 2023-12-13: qty 4, 30d supply, fill #5
  Filled 2023-12-17: qty 4, 30d supply, fill #0

## 2023-07-09 MED ORDER — ISOSORBIDE MONONITRATE ER 30 MG PO TB24
30.0000 mg | ORAL_TABLET | Freq: Every day | ORAL | 3 refills | Status: AC
  Filled 2023-07-09: qty 90, 90d supply, fill #0
  Filled 2023-10-04 – 2023-10-21 (×2): qty 90, 90d supply, fill #1
  Filled 2024-02-14: qty 90, 90d supply, fill #2

## 2023-07-09 NOTE — Progress Notes (Unsigned)
 Office Visit    Patient Name: [name] Date of Encounter: 07/09/2023  Primary Care Provider:  Duncan Gibson, NP Primary Cardiologist:  Randene Bustard, MD  Chief Complaint    63 year old female with a history of CAD s/p STEMI, DES-LCx in 11/2022, DVT, hypertension, hyperlipidemia, and tobacco use who presents for follow-up related to CAD.   Past Medical History    Past Medical History:  Diagnosis Date   DVT (deep venous thrombosis) (HCC)    Headache(784.0)    otc meds prn    Heart attack (HCC) 11/22/2022   History of hysterectomy 2014   Hyperlipidemia    Hypertension 09/2018   Plantar warts 08/2019   SVD (spontaneous vaginal delivery)    x 5   Vitamin D  deficiency 09/2018   Past Surgical History:  Procedure Laterality Date   ABDOMINAL HYSTERECTOMY  04/06/2012   Procedure: HYSTERECTOMY ABDOMINAL;  Surgeon: Piedad Brewer, MD;  Location: [facility];  Service: Gynecology;  Laterality: N/A;   BREAST SURGERY     benign turmor removed   CORONARY/GRAFT ACUTE MI REVASCULARIZATION N/A 11/21/2022   Procedure: Coronary/Graft Acute MI Revascularization;  Surgeon: Arleen Lacer, MD;  Location: [facility] INVASIVE CV LAB;  Service: Cardiovascular;  Laterality: N/A;   SALPINGOOPHORECTOMY  04/06/2012   Procedure: SALPINGO OOPHORECTOMY;  Surgeon: Piedad Brewer, MD;  Location: [facility];  Service: Gynecology;  Laterality: Bilateral;   TUBAL LIGATION      Allergies  No Known Allergies   Labs/Other Studies Reviewed    The following studies were reviewed today:  Cardiac Studies & Procedures   ______________________________________________________________________________________________ CARDIAC CATHETERIZATION  CARDIAC CATHETERIZATION 11/21/2022  Conclusion   Dist Cx lesion is 100% stenosed with 100% stenosed side branch in 4th Mrg.   A drug-eluting stent was successfully placed from dLCx into 4th Mrg using a STENT ONYX FRONTIER 2.5X26 -> deployed to 2.75 mm.  Post  intervention, there is a 0% residual stenosis from LCx into 4thMrg(LPL1).   --------------------   Prox RCA to Mid RCA lesion is 40% stenosed.   --------------------   In the absence of any other complications or medical issues, we expect the patient to be ready for discharge from an interventional cardiology perspective on 11/22/2022.   Recommend to resume Rivaroxaban , at currently prescribed dose and frequency on 11/22/2022.   Recommend concurrent antiplatelet therapy of Aspirin  81 mg for 1 month and Clopidogrel  75mg  daily for 12 months .  POST-CATH DIAGNOSES Severe single-vessel CAD-100% thrombotic/ulcerated occlusion (TIMI 0 flow) of mid LCx as culprit lesion for Inferolateral STEMI Treated successfully with Onyx frontier DES 2.5 mm x 26 mm deployed to 2.75 mm, restoring TIMI-3 flow Otherwise minimal mild disease in the RCA. Normal LVEF (55 to 65%) and moderately elevated LVEDP of 24 mmHg.   RECOMMENDATIONS Temporary ICU admission for monitoring; check 2D Echo and cycle troponins   In the absence of any other complications or medical issues, we expect the patient to be ready for discharge from an interventional cardiology perspective on 11/22/2022. Restart home medications including amlodipine  and carvedilol    Recommend to resume Rivaroxaban , at currently prescribed dose and frequency on 11/22/2022.   Recommend concurrent antiplatelet therapy of Aspirin  81 mg for 1 month and Clopidogrel  75mg  daily for 12 months .     Randene Bustard, MD  Findings Coronary Findings Diagnostic  Dominance: Right  Left Anterior Descending  Second Diagonal Branch Vessel is small in size.  Left Circumflex Vessel is large. Dist Cx lesion is 100% stenosed  with 100% stenosed side branch in 4th Mrg. The lesion is distal to major branch, eccentric, irregular, thrombotic and ulcerative.  Third Obtuse Marginal Branch Vessel is small in size.  Fourth Obtuse Marginal Branch Vessel is moderate in size.  Courses of posterolateral branch.  Right Coronary Artery Prox RCA to Mid RCA lesion is 40% stenosed.  Right Ventricular Branch Vessel is small in size.  Right Posterior Descending Artery Vessel is small in size.  First Right Posterolateral Branch Vessel is small in size.  Intervention  Dist Cx lesion with side branch in 4th Mrg Stent - Main Branch Lesion length:  25 mm. CATH LAUNCHER 6FR EBU3.5 guide catheter was inserted. Lesion crossed with guidewire using a WIRE RUNTHROUGH .[id number]. Pre-stent angioplasty was performed using a BALLN EMERGE MR 2.5X15. Maximum pressure:  6 atm. Inflation time: 20 sec. 3 separate inflations A drug-eluting stent was successfully placed using a STENT ONYX FRONTIER 2.5X26. Stent goes from AV groove LCx into OM/LPL 1 Maximum pressure: 18 atm. Inflation time: 30 sec. Stent strut is well apposed. 2.75 mm Post-stent angioplasty was not performed. Deployed to high ATM 1 stent to cover both lesions Stent - Side Branch Lesion length:  25 mm. A stent was successfully placed. Post-Intervention Lesion Assessment The intervention was successful. Pre-interventional TIMI flow is 0. Post-intervention TIMI flow is 3. Treated lesion length:  26 mm. No complications occurred at this lesion. There is a 0% residual stenosis in the main branch post intervention. There is a 0% residual stenosis in the side branch post intervention.     ECHOCARDIOGRAM  ECHOCARDIOGRAM COMPLETE 02/15/2023  Narrative ECHOCARDIOGRAM REPORT    Patient Name:   [name] Date of Exam: 02/15/2023 Medical Rec #:  [id number]       Height:       66.0 in Accession #:    [id number]      Weight:       184.2 lb Date of Birth:  Jan 29, 1961       BSA:          1.931 m Patient Age:    62 years        BP:           130/82 mmHg Patient Gender: F               HR:           65 bpm. Exam Location:  [address]  Procedure: 2D Echo, 3D Echo, Cardiac Doppler, Color Doppler and Strain  Analysis  Indications:    I51.9 LV Dysfunction  History:        Patient has prior history of Echocardiogram examinations, most recent 11/21/2022. Risk Factors:Hypertension and Dyslipidemia.  Sonographer:    NaTashia Rodgers-Jones RDCS Referring Phys: [id number] Ean Gettel C Bernice Mcauliffe  IMPRESSIONS   1. Left ventricular ejection fraction, by estimation, is 60 to 65%. The left ventricle has normal function. The left ventricle has no regional wall motion abnormalities. There is mild concentric left ventricular hypertrophy. Left ventricular diastolic parameters were normal. GLS -25.2%. 2. Right ventricular systolic function is normal. The right ventricular size is normal. 3. The mitral valve is normal in structure. Mild mitral valve regurgitation. No evidence of mitral stenosis. 4. Tricuspid valve regurgitation is moderate. 5. The aortic valve is normal in structure. Aortic valve regurgitation is not visualized. No aortic stenosis is present. 6. The inferior vena cava is normal in size with greater than 50% respiratory variability, suggesting right atrial pressure of 3 mmHg.  FINDINGS  Left Ventricle: Left ventricular ejection fraction, by estimation, is 60 to 65%. The left ventricle has normal function. The left ventricle has no regional wall motion abnormalities. The left ventricular internal cavity size was normal in size. There is mild concentric left ventricular hypertrophy. Left ventricular diastolic parameters were normal.  Right Ventricle: The right ventricular size is normal. No increase in right ventricular wall thickness. Right ventricular systolic function is normal.  Left Atrium: Left atrial size was normal in size.  Right Atrium: Right atrial size was normal in size.  Pericardium: There is no evidence of pericardial effusion.  Mitral Valve: The mitral valve is normal in structure. Mild mitral valve regurgitation. No evidence of mitral valve stenosis.  Tricuspid Valve: The tricuspid valve  is normal in structure. Tricuspid valve regurgitation is moderate . No evidence of tricuspid stenosis.  Aortic Valve: The aortic valve is normal in structure. Aortic valve regurgitation is not visualized. No aortic stenosis is present.  Pulmonic Valve: The pulmonic valve was normal in structure. Pulmonic valve regurgitation is trivial. No evidence of pulmonic stenosis.  Aorta: The aortic root is normal in size and structure.  Venous: The inferior vena cava is normal in size with greater than 50% respiratory variability, suggesting right atrial pressure of 3 mmHg.  IAS/Shunts: No atrial level shunt detected by color flow Doppler.   LEFT VENTRICLE PLAX 2D LVIDd:         4.00 cm   Diastology LVIDs:         2.70 cm   LV e' medial:    6.80 cm/s LV PW:         1.00 cm   LV E/e' medial:  13.6 LV IVS:        1.00 cm   LV e' lateral:   9.62 cm/s LVOT diam:     1.80 cm   LV E/e' lateral: 9.6 LV SV:         69 LV SV Index:   36        2D Longitudinal Strain LVOT Area:     2.54 cm  2D Strain GLS (A2C):   -25.7 % 2D Strain GLS (A3C):   -25.0 % 2D Strain GLS (A4C):   -24.9 % 2D Strain GLS Avg:     -25.2 %  3D Volume EF: 3D EF:        58 % LV EDV:       149 ml LV ESV:       62 ml LV SV:        87 ml  RIGHT VENTRICLE             IVC RV Basal diam:  5.10 cm     IVC diam: 0.90 cm RV S prime:     18.80 cm/s TAPSE (M-mode): 2.9 cm  LEFT ATRIUM             Index        RIGHT ATRIUM           Index LA diam:        4.30 cm 2.23 cm/m   RA Area:     16.60 cm LA Vol (A2C):   61.3 ml 31.74 ml/m  RA Volume:   52.80 ml  27.34 ml/m LA Vol (A4C):   37.5 ml 19.42 ml/m LA Biplane Vol: 48.6 ml 25.17 ml/m AORTIC VALVE LVOT Vmax:   127.50 cm/s LVOT Vmean:  79.700 cm/s LVOT VTI:    0.272 m  AORTA Ao Root  diam: 3.00 cm Ao Asc diam:  3.40 cm  MITRAL VALVE                TRICUSPID VALVE MV Area (PHT): 3.26 cm     TR Peak grad:   26.0 mmHg MV Decel Time: 233 msec     TR Vmax:        255.00  cm/s MR Peak grad: 116.6 mmHg MR Vmax:      540.00 cm/s   SHUNTS MV E velocity: 92.40 cm/s   Systemic VTI:  0.27 m MV A velocity: 104.00 cm/s  Systemic Diam: 1.80 cm MV E/A ratio:  0.89  Aditya Sabharwal Electronically signed by Alwin Baars Signature Date/Time: 02/15/2023/2:19:00 PM    Final          ______________________________________________________________________________________________     Recent Labs: 11/21/2022: B Natriuretic Peptide 48.4; TSH 1.796 11/22/2022: BUN 11; Creatinine, Ser 0.79; Hemoglobin 11.8; Platelets 208; Potassium 3.4; Sodium 141  Recent Lipid Panel    Component Value Date/Time   CHOL 149 11/22/2022 0507   CHOL 211 (H) 09/14/2018 0916   TRIG 63 11/22/2022 0507   HDL 24 (L) 11/22/2022 0507   HDL 42 09/14/2018 0916   CHOLHDL 6.2 11/22/2022 0507   VLDL 13 11/22/2022 0507   LDLCALC 112 (H) 11/22/2022 0507   LDLCALC 210 (H) 05/28/2022 1138    History of Present Illness    63 year old female with the above past medical history including CAD s/p STEMI, DES-LCx in 11/2022, DVT, hypertension, hyperlipidemia, and tobacco use.   She has a history of prior DVT on Xarelto .  She was hospitalized in 11/2022 in the setting of STEMI.  She underwent emergent cardiac catheterization which revealed 100% stenosis of the distal LCx into the fourth marginal, s/p DES, 40% proximal to mid RCA stenosis, EF 55 to 65%.  Xarelto  was resumed and she was advised to begin concurrent DAPT with aspirin  x 1 month and Plavix  x 12 months.  Echocardiogram showed EF 65 to 70%, LV hyperdynamic function, RWMA, moderate hypokinesis of the left ventricular, basal inferior wall and inferolateral wall, normal RV systolic function, mildly elevated PASP, moderate mitral valve regurgitation. It was felt that this was likely due to an ischemic mechanism and could be reassessed as an outpatient.  She was started on carvedilol  and Crestor .  Repeat echocardiogram in 02/2023 showed EF 60 to 65%,  normal LV function, no RWMA, mild concentric LVH, normal RV systolic function, mild mitral valve regurgitation. She was last seen in the office on 04/14/2023 and was stable from a cardiac standpoint.  BP was mildly elevated.  Carvedilol  was increased to 12.5 mg twice daily.  She denies symptoms concerning for angina. She was scheduled to have back surgery prior to her hospitalization in the setting of chronic back pain.  It was noted that she should wait at least 6 months if not a year prior to any surgeries due to the need for uninterrupted DAPT.    She presents today for follow-up.  Since her last visit she has been stable overall from a cardiac standpoint.  She has noted some increased shortness of breath with exertion, she has had 2 episodes of chest discomfort, she has not taken nitroglycerin  for her symptoms.  Symptoms lasted for minutes and resolved spontaneously.  She also reports  right upper quadrant abdominal pain. She recently had a CT of her abdomen, results are pending.  She denies any palpitations, dizziness, edema, PND, orthopnea, weight gain.  She reports a significant  amount of personal stress, particularly pertaining to her current living situation.  Her apartment bathroom has a significant leak, additionally, she is concerned for mold.  She is hoping to move.  Home Medications    Current Outpatient Medications  Medication Sig Dispense Refill   carvedilol  (COREG ) 12.5 MG tablet Take 1 tablet (12.5 mg total) by mouth 2 (two) times daily with a meal. 180 tablet 3   carvedilol  (COREG ) 12.5 MG tablet Take 1 tablet (12.5 mg total) by mouth in the morning and in evening. 180 tablet 3   clopidogrel  (PLAVIX ) 75 MG tablet Take 1 tablet (75 mg total) by mouth daily with breakfast. 90 tablet 3   hydrochlorothiazide  (HYDRODIURIL ) 12.5 MG tablet Take 1 tablet (12.5 mg total) by mouth daily. 90 tablet 3   NARCAN  4 MG/0.1ML LIQD nasal spray kit      nitroGLYCERIN  (NITROSTAT ) 0.4 MG SL tablet Place 1  tablet (0.4 mg total) under the tongue every 5 (five) minutes x 3 doses as needed for chest pain. 25 tablet 2   oxyCODONE -acetaminophen  (PERCOCET) 10-325 MG tablet Take 1 tablet by mouth 2 (two) times daily as needed. 60 tablet 0   rivaroxaban  (XARELTO ) 10 MG TABS tablet Take 1 tablet (10 mg total) by mouth daily. 30 tablet 3   rosuvastatin  (CRESTOR ) 20 MG tablet TAKE 1 TABLET BY MOUTH EVERY DAY 90 tablet 3   rosuvastatin  (CRESTOR ) 40 MG tablet Take 1 tablet (40 mg total) by mouth daily for cholesterol and vascular health. 90 tablet 3   tiZANidine  (ZANAFLEX ) 2 MG tablet Take 1 tablet (2 mg total) by mouth every 8 (eight) hours as needed for muscle spasms. 90 tablet 3   Vitamin D , Ergocalciferol , (DRISDOL ) 1.25 MG (50000 UNIT) CAPS capsule Take 1 capsule (50,000 Units total) by mouth every 7 (seven) days. 5 capsule 3   hydrochlorothiazide  (HYDRODIURIL ) 12.5 MG tablet Take 1 tablet (12.5 mg total) by mouth every morning. (Patient not taking: Reported on 07/09/2023) 90 tablet 3   No current facility-administered medications for this visit.     Review of Systems    She denies palpitations, pnd, orthopnea, n, v, dizziness, syncope, edema, weight gain, or early satiety. All other systems reviewed and are otherwise negative except as noted above.   Physical Exam    VS:  BP 130/86   Pulse 82   Ht 5\' 5"  (1.651 m)   Wt 188 lb 5.4 oz (85.4 kg)   LMP 06/13/2011   SpO2 95%   BMI 31.34 kg/m  GEN: Well nourished, well developed, in no acute distress. HEENT: normal. Neck: Supple, no JVD, carotid bruits, or masses. Cardiac: RRR, no murmurs, rubs, or gallops. No clubbing, cyanosis, edema.  Radials/DP/PT 2+ and equal bilaterally.  Respiratory:  Respirations regular and unlabored, clear to auscultation bilaterally. GI: Soft, nontender, nondistended, BS + x 4. MS: no deformity or atrophy. Skin: warm and dry, no rash. Neuro:  Strength and sensation are intact. Psych: Normal affect.  Accessory Clinical  Findings    ECG personally reviewed by me today - EKG Interpretation Date/Time:  Friday Jul 09 2023 09:55:26 EDT Ventricular Rate:  82 PR Interval:  122 QRS Duration:  84 QT Interval:  370 QTC Calculation: 432 R Axis:   34  Text Interpretation: Normal sinus rhythm Left atrial enlargement Nonspecific T wave abnormality When compared with ECG of 01-Dec-2022 11:17, Premature ventricular complexes are no longer Present Confirmed by Marlana Silvan ([id number]) on 07/09/2023 10:10:47 AM  - no acute changes.  Lab Results  Component Value Date   WBC 5.8 11/22/2022   HGB 11.8 (L) 11/22/2022   HCT 36.4 11/22/2022   MCV 93.3 11/22/2022   PLT 208 11/22/2022   Lab Results  Component Value Date   CREATININE 0.79 11/22/2022   BUN 11 11/22/2022   NA 141 11/22/2022   K 3.4 (L) 11/22/2022   CL 112 (H) 11/22/2022   CO2 21 (L) 11/22/2022   Lab Results  Component Value Date   ALT 10 05/28/2022   AST 12 05/28/2022   ALKPHOS 85 09/14/2018   BILITOT 0.5 05/28/2022   Lab Results  Component Value Date   CHOL 149 11/22/2022   HDL 24 (L) 11/22/2022   LDLCALC 112 (H) 11/22/2022   TRIG 63 11/22/2022   CHOLHDL 6.2 11/22/2022    Lab Results  Component Value Date   HGBA1C 5.7 (H) 11/21/2022    Assessment & Plan    1. CAD: S/p STEMI, DES-LCx in 11/2022.  She had residual 40% proximal to mid RCA stenosis. She has noted some increased shortness of breath with exertion, she has had 2 episodes of chest discomfort, she has not taken nitroglycerin  for her symptoms.  Symptoms lasted for minutes and resolved spontaneously.  Will trial Imdur  30 mg daily.  Should she have progressive symptoms concerning for angina, consider need for ischemic evaluation. Through shared decision making, will defer for now.  Reviewed ED precautions, nitroglycerin  use. No ASA in the setting of chronic DOAC therapy. Continue Plavix , carvedilol , amlodipine , and Crestor .   2.  PVCs: Noted on prior EKGs, asymptomatic.  Continue  carvedilol .   3. History of DVT: On chronic Xarelto .  Managed per PCP.    4. Hypertension: BP improved with increased carvedilol . Continue to monitor BP greater than 130/80, monitor heart rate and report heart rate consistently less than 50 bpm. Continue current antihypertensive regimen.   5. Hyperlipidemia: LDL was 112 in 11/2022. Will repeat fasting lipids, CMET.  Continue Crestor .   6. LV dysfunction/mitral valve regurgitation: Echocardiogram noted EF 65 to 70%, LV hyperdynamic function, RWMA, moderate hypokinesis of the left ventricular, basal inferior wall and inferolateral wall, normal RV systolic function, mildly elevated PASP, moderate mitral valve regurgitation. Repeat echocardiogram in 02/2023 showed EF 60 to 65%, normal LV function, no RWMA, mild concentric LVH, normal RV systolic function, mild mitral valve regurgitation. Euvolemic and well compensated on exam.  Consider repeat echocardiogram as clinically indicated.   7. Tobacco use: She is no longer smoking.  I congratulated her on this.  Ongoing cessation advised.    8. Disposition: Follow-up in 6 weeks, sooner if needed.      Jude Norton, NP 07/09/2023, 10:11 AM

## 2023-07-09 NOTE — Patient Instructions (Addendum)
 Medication Instructions:  Start Imdur  30 mg daily  *If you need a refill on your cardiac medications before your next appointment, please call your pharmacy*  Lab Work: Complete fasting lab work previously ordered  Testing/Procedures: NONE ordered at this time of appointment   Follow-Up: At [facility], you and your health needs are our priority.  As part of our continuing mission to provide you with exceptional heart care, our providers are all part of one team.  This team includes your primary Cardiologist (physician) and Advanced Practice Providers or APPs (Physician Assistants and Nurse Practitioners) who all work together to provide you with the care you need, when you need it.  Your next appointment:   6 week(s)  Provider:   Marlana Silvan, NP          We recommend signing up for the patient portal called "MyChart".  Sign up information is provided on this After Visit Summary.  MyChart is used to connect with patients for Virtual Visits (Telemedicine).  Patients are able to view lab/test results, encounter notes, upcoming appointments, etc.  Non-urgent messages can be sent to your provider as well.   To learn more about what you can do with MyChart, go to [email or web address].   Other Instructions

## 2023-07-11 ENCOUNTER — Encounter: Payer: Self-pay | Admitting: Nurse Practitioner

## 2023-07-12 ENCOUNTER — Other Ambulatory Visit: Payer: Self-pay

## 2023-07-12 ENCOUNTER — Other Ambulatory Visit (HOSPITAL_COMMUNITY): Payer: Self-pay

## 2023-07-22 ENCOUNTER — Other Ambulatory Visit: Payer: Self-pay

## 2023-07-22 ENCOUNTER — Other Ambulatory Visit (HOSPITAL_COMMUNITY): Payer: Self-pay

## 2023-08-23 ENCOUNTER — Other Ambulatory Visit: Payer: Self-pay | Admitting: Physical Medicine & Rehabilitation

## 2023-08-23 ENCOUNTER — Other Ambulatory Visit: Payer: Self-pay

## 2023-08-23 ENCOUNTER — Ambulatory Visit: Attending: Nurse Practitioner | Admitting: Nurse Practitioner

## 2023-08-23 NOTE — Progress Notes (Deleted)
 Office Visit    Patient Name: [name] Date of Encounter: 08/23/2023  Primary Care Provider:  Duncan Gibson, NP Primary Cardiologist:  Randene Bustard, MD  Chief Complaint    63 year old female with a history of CAD s/p STEMI, DES-LCx in 11/2022, DVT, hypertension, hyperlipidemia, and former tobacco use who presents for follow-up related to CAD.   Past Medical History    Past Medical History:  Diagnosis Date   DVT (deep venous thrombosis) (HCC)    Headache(784.0)    otc meds prn    Heart attack (HCC) 11/22/2022   History of hysterectomy 2014   Hyperlipidemia    Hypertension 09/2018   Plantar warts 08/2019   SVD (spontaneous vaginal delivery)    x 5   Vitamin D  deficiency 09/2018   Past Surgical History:  Procedure Laterality Date   ABDOMINAL HYSTERECTOMY  04/06/2012   Procedure: HYSTERECTOMY ABDOMINAL;  Surgeon: Piedad Brewer, MD;  Location: [facility];  Service: Gynecology;  Laterality: N/A;   BREAST SURGERY     benign turmor removed   CORONARY/GRAFT ACUTE MI REVASCULARIZATION N/A 11/21/2022   Procedure: Coronary/Graft Acute MI Revascularization;  Surgeon: Arleen Lacer, MD;  Location: [facility] INVASIVE CV LAB;  Service: Cardiovascular;  Laterality: N/A;   SALPINGOOPHORECTOMY  04/06/2012   Procedure: SALPINGO OOPHORECTOMY;  Surgeon: Piedad Brewer, MD;  Location: [facility];  Service: Gynecology;  Laterality: Bilateral;   TUBAL LIGATION      Allergies  No Known Allergies   Labs/Other Studies Reviewed    The following studies were reviewed today:  Cardiac Studies & Procedures   ______________________________________________________________________________________________ CARDIAC CATHETERIZATION  CARDIAC CATHETERIZATION 11/21/2022  Conclusion   Dist Cx lesion is 100% stenosed with 100% stenosed side branch in 4th Mrg.   A drug-eluting stent was successfully placed from dLCx into 4th Mrg using a STENT ONYX FRONTIER 2.5X26 -> deployed to 2.75 mm.  Post  intervention, there is a 0% residual stenosis from LCx into 4thMrg(LPL1).   --------------------   Prox RCA to Mid RCA lesion is 40% stenosed.   --------------------   In the absence of any other complications or medical issues, we expect the patient to be ready for discharge from an interventional cardiology perspective on 11/22/2022.   Recommend to resume Rivaroxaban , at currently prescribed dose and frequency on 11/22/2022.   Recommend concurrent antiplatelet therapy of Aspirin  81 mg for 1 month and Clopidogrel  75mg  daily for 12 months .  POST-CATH DIAGNOSES Severe single-vessel CAD-100% thrombotic/ulcerated occlusion (TIMI 0 flow) of mid LCx as culprit lesion for Inferolateral STEMI Treated successfully with Onyx frontier DES 2.5 mm x 26 mm deployed to 2.75 mm, restoring TIMI-3 flow Otherwise minimal mild disease in the RCA. Normal LVEF (55 to 65%) and moderately elevated LVEDP of 24 mmHg.   RECOMMENDATIONS Temporary ICU admission for monitoring; check 2D Echo and cycle troponins   In the absence of any other complications or medical issues, we expect the patient to be ready for discharge from an interventional cardiology perspective on 11/22/2022. Restart home medications including amlodipine  and carvedilol    Recommend to resume Rivaroxaban , at currently prescribed dose and frequency on 11/22/2022.   Recommend concurrent antiplatelet therapy of Aspirin  81 mg for 1 month and Clopidogrel  75mg  daily for 12 months .     Randene Bustard, MD  Findings Coronary Findings Diagnostic  Dominance: Right  Left Anterior Descending  Second Diagonal Branch Vessel is small in size.  Left Circumflex Vessel is large. Dist Cx lesion is 100%  stenosed with 100% stenosed side branch in 4th Mrg. The lesion is distal to major branch, eccentric, irregular, thrombotic and ulcerative.  Third Obtuse Marginal Branch Vessel is small in size.  Fourth Obtuse Marginal Branch Vessel is moderate in size.  Courses of posterolateral branch.  Right Coronary Artery Prox RCA to Mid RCA lesion is 40% stenosed.  Right Ventricular Branch Vessel is small in size.  Right Posterior Descending Artery Vessel is small in size.  First Right Posterolateral Branch Vessel is small in size.  Intervention  Dist Cx lesion with side branch in 4th Mrg Stent - Main Branch Lesion length:  25 mm. CATH LAUNCHER 6FR EBU3.5 guide catheter was inserted. Lesion crossed with guidewire using a WIRE RUNTHROUGH .[id number]. Pre-stent angioplasty was performed using a BALLN EMERGE MR 2.5X15. Maximum pressure:  6 atm. Inflation time: 20 sec. 3 separate inflations A drug-eluting stent was successfully placed using a STENT ONYX FRONTIER 2.5X26. Stent goes from AV groove LCx into OM/LPL 1 Maximum pressure: 18 atm. Inflation time: 30 sec. Stent strut is well apposed. 2.75 mm Post-stent angioplasty was not performed. Deployed to high ATM 1 stent to cover both lesions Stent - Side Branch Lesion length:  25 mm. A stent was successfully placed. Post-Intervention Lesion Assessment The intervention was successful. Pre-interventional TIMI flow is 0. Post-intervention TIMI flow is 3. Treated lesion length:  26 mm. No complications occurred at this lesion. There is a 0% residual stenosis in the main branch post intervention. There is a 0% residual stenosis in the side branch post intervention.     ECHOCARDIOGRAM  ECHOCARDIOGRAM COMPLETE 02/15/2023  Narrative ECHOCARDIOGRAM REPORT    Patient Name:   [name] Date of Exam: 02/15/2023 Medical Rec #:  [id number]       Height:       66.0 in Accession #:    [id number]      Weight:       184.2 lb Date of Birth:  08-28-1960       BSA:          1.931 m Patient Age:    62 years        BP:           130/82 mmHg Patient Gender: F               HR:           65 bpm. Exam Location:  [address]  Procedure: 2D Echo, 3D Echo, Cardiac Doppler, Color Doppler and Strain  Analysis  Indications:    I51.9 LV Dysfunction  History:        Patient has prior history of Echocardiogram examinations, most recent 11/21/2022. Risk Factors:Hypertension and Dyslipidemia.  Sonographer:    NaTashia Rodgers-Jones RDCS Referring Phys: [id number] Marwin Primmer C Galilee Pierron  IMPRESSIONS   1. Left ventricular ejection fraction, by estimation, is 60 to 65%. The left ventricle has normal function. The left ventricle has no regional wall motion abnormalities. There is mild concentric left ventricular hypertrophy. Left ventricular diastolic parameters were normal. GLS -25.2%. 2. Right ventricular systolic function is normal. The right ventricular size is normal. 3. The mitral valve is normal in structure. Mild mitral valve regurgitation. No evidence of mitral stenosis. 4. Tricuspid valve regurgitation is moderate. 5. The aortic valve is normal in structure. Aortic valve regurgitation is not visualized. No aortic stenosis is present. 6. The inferior vena cava is normal in size with greater than 50% respiratory variability, suggesting right atrial pressure of 3 mmHg.  FINDINGS Left Ventricle: Left ventricular ejection fraction, by estimation, is 60 to 65%. The left ventricle has normal function. The left ventricle has no regional wall motion abnormalities. The left ventricular internal cavity size was normal in size. There is mild concentric left ventricular hypertrophy. Left ventricular diastolic parameters were normal.  Right Ventricle: The right ventricular size is normal. No increase in right ventricular wall thickness. Right ventricular systolic function is normal.  Left Atrium: Left atrial size was normal in size.  Right Atrium: Right atrial size was normal in size.  Pericardium: There is no evidence of pericardial effusion.  Mitral Valve: The mitral valve is normal in structure. Mild mitral valve regurgitation. No evidence of mitral valve stenosis.  Tricuspid Valve: The tricuspid valve  is normal in structure. Tricuspid valve regurgitation is moderate . No evidence of tricuspid stenosis.  Aortic Valve: The aortic valve is normal in structure. Aortic valve regurgitation is not visualized. No aortic stenosis is present.  Pulmonic Valve: The pulmonic valve was normal in structure. Pulmonic valve regurgitation is trivial. No evidence of pulmonic stenosis.  Aorta: The aortic root is normal in size and structure.  Venous: The inferior vena cava is normal in size with greater than 50% respiratory variability, suggesting right atrial pressure of 3 mmHg.  IAS/Shunts: No atrial level shunt detected by color flow Doppler.   LEFT VENTRICLE PLAX 2D LVIDd:         4.00 cm   Diastology LVIDs:         2.70 cm   LV e' medial:    6.80 cm/s LV PW:         1.00 cm   LV E/e' medial:  13.6 LV IVS:        1.00 cm   LV e' lateral:   9.62 cm/s LVOT diam:     1.80 cm   LV E/e' lateral: 9.6 LV SV:         69 LV SV Index:   36        2D Longitudinal Strain LVOT Area:     2.54 cm  2D Strain GLS (A2C):   -25.7 % 2D Strain GLS (A3C):   -25.0 % 2D Strain GLS (A4C):   -24.9 % 2D Strain GLS Avg:     -25.2 %  3D Volume EF: 3D EF:        58 % LV EDV:       149 ml LV ESV:       62 ml LV SV:        87 ml  RIGHT VENTRICLE             IVC RV Basal diam:  5.10 cm     IVC diam: 0.90 cm RV S prime:     18.80 cm/s TAPSE (M-mode): 2.9 cm  LEFT ATRIUM             Index        RIGHT ATRIUM           Index LA diam:        4.30 cm 2.23 cm/m   RA Area:     16.60 cm LA Vol (A2C):   61.3 ml 31.74 ml/m  RA Volume:   52.80 ml  27.34 ml/m LA Vol (A4C):   37.5 ml 19.42 ml/m LA Biplane Vol: 48.6 ml 25.17 ml/m AORTIC VALVE LVOT Vmax:   127.50 cm/s LVOT Vmean:  79.700 cm/s LVOT VTI:    0.272 m  AORTA Ao  Root diam: 3.00 cm Ao Asc diam:  3.40 cm  MITRAL VALVE                TRICUSPID VALVE MV Area (PHT): 3.26 cm     TR Peak grad:   26.0 mmHg MV Decel Time: 233 msec     TR Vmax:        255.00  cm/s MR Peak grad: 116.6 mmHg MR Vmax:      540.00 cm/s   SHUNTS MV E velocity: 92.40 cm/s   Systemic VTI:  0.27 m MV A velocity: 104.00 cm/s  Systemic Diam: 1.80 cm MV E/A ratio:  0.89  Aditya Sabharwal Electronically signed by Alwin Baars Signature Date/Time: 02/15/2023/2:19:00 PM    Final          ______________________________________________________________________________________________     Recent Labs: 11/21/2022: B Natriuretic Peptide 48.4; TSH 1.796 11/22/2022: BUN 11; Creatinine, Ser 0.79; Hemoglobin 11.8; Platelets 208; Potassium 3.4; Sodium 141  Recent Lipid Panel    Component Value Date/Time   CHOL 149 11/22/2022 0507   CHOL 211 (H) 09/14/2018 0916   TRIG 63 11/22/2022 0507   HDL 24 (L) 11/22/2022 0507   HDL 42 09/14/2018 0916   CHOLHDL 6.2 11/22/2022 0507   VLDL 13 11/22/2022 0507   LDLCALC 112 (H) 11/22/2022 0507   LDLCALC 210 (H) 05/28/2022 1138    History of Present Illness    63 year old female with the above past medical history including CAD s/p STEMI, DES-LCx in 11/2022, DVT, hypertension, hyperlipidemia, and former tobacco use.   She has a history of prior DVT on Xarelto .  She was hospitalized in 11/2022 in the setting of STEMI.  She underwent emergent cardiac catheterization which revealed 100% stenosis of the distal LCx into the fourth marginal, s/p DES, 40% proximal to mid RCA stenosis, EF 55 to 65%.  Xarelto  was resumed and she was advised to begin concurrent DAPT with aspirin  x 1 month and Plavix  x 12 months.  Echocardiogram showed EF 65 to 70%, LV hyperdynamic function, RWMA, moderate hypokinesis of the left ventricular, basal inferior wall and inferolateral wall, normal RV systolic function, mildly elevated PASP, moderate mitral valve regurgitation. It was felt that this was likely due to an ischemic mechanism and could be reassessed as an outpatient.  She was started on carvedilol  and Crestor .  Repeat echocardiogram in 02/2023 showed EF 60 to  65%, normal LV function, no RWMA, mild concentric LVH, normal RV systolic function, mild mitral valve regurgitation. She was scheduled to have back surgery prior to her hospitalization in the setting of chronic back pain.  It was noted that she should wait at least 6 months if not a year prior to any surgeries due to the need for uninterrupted DAPT.  She was last seen in the office on 07/09/2023 and was stable from a cardiac standpoint.  She did report some increased dyspnea on exertion, 2 episodes of chest discomfort.  She was started on Imdur .  She reported a significant amount of personal stress at the time.   She presents today for follow-up.  Since her last visit she   1. CAD: S/p STEMI, DES-LCx in 11/2022.  She had residual 40% proximal to mid RCA stenosis. She has noted some increased shortness of breath with exertion, she has had 2 episodes of chest discomfort, she has not taken nitroglycerin  for her symptoms.  Symptoms lasted for minutes and resolved spontaneously.  Will trial Imdur  30 mg daily.  Should she have progressive symptoms concerning  for angina, consider need for ischemic evaluation. Through shared decision making, will defer for now.  Reviewed ED precautions, nitroglycerin  use. No ASA in the setting of chronic DOAC therapy. Continue Plavix , carvedilol , amlodipine , and Crestor .   2.  PVCs: Noted on prior EKGs, asymptomatic.  Continue carvedilol .   3. History of DVT: On chronic Xarelto .  Managed per PCP.    4. Hypertension: BP improved with increased carvedilol . Continue to monitor BP greater than 130/80, monitor heart rate and report heart rate consistently less than 50 bpm. Continue current antihypertensive regimen.   5. Hyperlipidemia: LDL was 112 in 11/2022. Will repeat fasting lipids, CMET.  Continue Crestor .   6. LV dysfunction/mitral valve regurgitation: Echocardiogram noted EF 65 to 70%, LV hyperdynamic function, RWMA, moderate hypokinesis of the left ventricular, basal inferior  wall and inferolateral wall, normal RV systolic function, mildly elevated PASP, moderate mitral valve regurgitation. Repeat echocardiogram in 02/2023 showed EF 60 to 65%, normal LV function, no RWMA, mild concentric LVH, normal RV systolic function, mild mitral valve regurgitation. Euvolemic and well compensated on exam.  Consider repeat echocardiogram as clinically indicated.   7. Tobacco use: She is no longer smoking.  I congratulated her on this.  Ongoing cessation advised.    8. Disposition: Follow-up in  Home Medications    Current Outpatient Medications  Medication Sig Dispense Refill   albuterol  (VENTOLIN  HFA) 108 (90 Base) MCG/ACT inhaler Inhale 2 puffs into the lungs every 4 (four) hours (up to 6 times a day) as needed for shortness of breath 20.1 g 3   carvedilol  (COREG ) 12.5 MG tablet Take 1 tablet (12.5 mg total) by mouth 2 (two) times daily with a meal. 180 tablet 3   carvedilol  (COREG ) 12.5 MG tablet Take 1 tablet (12.5 mg total) by mouth in the morning and in evening. 180 tablet 3   clopidogrel  (PLAVIX ) 75 MG tablet Take 1 tablet (75 mg total) by mouth daily with breakfast. 90 tablet 3   hydrochlorothiazide  (HYDRODIURIL ) 12.5 MG tablet Take 1 tablet (12.5 mg total) by mouth daily. 90 tablet 3   hydrochlorothiazide  (HYDRODIURIL ) 12.5 MG tablet Take 1 tablet (12.5 mg total) by mouth every morning. (Patient not taking: Reported on 07/09/2023) 90 tablet 3   isosorbide  mononitrate (IMDUR ) 30 MG 24 hr tablet Take 1 tablet (30 mg total) by mouth daily. 90 tablet 3   NARCAN  4 MG/0.1ML LIQD nasal spray kit      nitroGLYCERIN  (NITROSTAT ) 0.4 MG SL tablet Place 1 tablet (0.4 mg total) under the tongue every 5 (five) minutes x 3 doses as needed for chest pain. 25 tablet 2   oxyCODONE -acetaminophen  (PERCOCET) 10-325 MG tablet Take 1 tablet by mouth 2 (two) times daily as needed. 60 tablet 0   rivaroxaban  (XARELTO ) 10 MG TABS tablet Take 1 tablet (10 mg total) by mouth daily. 30 tablet 3    rosuvastatin  (CRESTOR ) 20 MG tablet TAKE 1 TABLET BY MOUTH EVERY DAY 90 tablet 3   rosuvastatin  (CRESTOR ) 40 MG tablet Take 1 tablet (40 mg total) by mouth daily for cholesterol and vascular health. 90 tablet 3   Tiotropium Bromide  Monohydrate (SPIRIVA  RESPIMAT) 2.5 MCG/ACT AERS Take 2 Inhalations into the lungs daily. 12 g 3   tiZANidine  (ZANAFLEX ) 2 MG tablet Take 1 tablet (2 mg total) by mouth every 8 (eight) hours as needed for muscle spasms. 90 tablet 3   Vitamin D , Ergocalciferol , (DRISDOL ) 1.25 MG (50000 UNIT) CAPS capsule Take 1 capsule (50,000 Units total) by mouth every 7 (  seven) days. 5 capsule 3   No current facility-administered medications for this visit.     Review of Systems    ***.  All other systems reviewed and are otherwise negative except as noted above.    Physical Exam    VS:  LMP 06/13/2011  , BMI There is no height or weight on file to calculate BMI.     GEN: Well nourished, well developed, in no acute distress. HEENT: normal. Neck: Supple, no JVD, carotid bruits, or masses. Cardiac: RRR, no murmurs, rubs, or gallops. No clubbing, cyanosis, edema.  Radials/DP/PT 2+ and equal bilaterally.  Respiratory:  Respirations regular and unlabored, clear to auscultation bilaterally. GI: Soft, nontender, nondistended, BS + x 4. MS: no deformity or atrophy. Skin: warm and dry, no rash. Neuro:  Strength and sensation are intact. Psych: Normal affect.  Accessory Clinical Findings    ECG personally reviewed by me today -    - no acute changes.   Lab Results  Component Value Date   WBC 5.8 11/22/2022   HGB 11.8 (L) 11/22/2022   HCT 36.4 11/22/2022   MCV 93.3 11/22/2022   PLT 208 11/22/2022   Lab Results  Component Value Date   CREATININE 0.79 11/22/2022   BUN 11 11/22/2022   NA 141 11/22/2022   K 3.4 (L) 11/22/2022   CL 112 (H) 11/22/2022   CO2 21 (L) 11/22/2022   Lab Results  Component Value Date   ALT 10 05/28/2022   AST 12 05/28/2022   ALKPHOS 85  09/14/2018   BILITOT 0.5 05/28/2022   Lab Results  Component Value Date   CHOL 149 11/22/2022   HDL 24 (L) 11/22/2022   LDLCALC 112 (H) 11/22/2022   TRIG 63 11/22/2022   CHOLHDL 6.2 11/22/2022    Lab Results  Component Value Date   HGBA1C 5.7 (H) 11/21/2022    Assessment & Plan    1.  ***  No BP recorded.  {Refresh Note OR Click here to enter BP  :1}***   Jude Norton, NP 08/23/2023, 6:55 AM

## 2023-08-24 ENCOUNTER — Other Ambulatory Visit: Payer: Self-pay

## 2023-08-24 MED ORDER — TIZANIDINE HCL 2 MG PO TABS
2.0000 mg | ORAL_TABLET | Freq: Three times a day (TID) | ORAL | 3 refills | Status: AC | PRN
  Filled 2023-08-24: qty 90, 30d supply, fill #0

## 2023-08-26 ENCOUNTER — Encounter: Payer: Self-pay | Admitting: Gastroenterology

## 2023-08-27 ENCOUNTER — Other Ambulatory Visit: Payer: Self-pay

## 2023-09-06 ENCOUNTER — Other Ambulatory Visit: Payer: Self-pay

## 2023-09-15 ENCOUNTER — Other Ambulatory Visit: Payer: Self-pay

## 2023-09-15 ENCOUNTER — Other Ambulatory Visit: Payer: Self-pay | Admitting: Adult Health

## 2023-09-15 DIAGNOSIS — Z86718 Personal history of other venous thrombosis and embolism: Secondary | ICD-10-CM

## 2023-09-15 MED ORDER — RIVAROXABAN 10 MG PO TABS
10.0000 mg | ORAL_TABLET | Freq: Every day | ORAL | 0 refills | Status: DC
  Filled 2023-09-15: qty 30, 30d supply, fill #0

## 2023-09-15 NOTE — Telephone Encounter (Signed)
 Patient has request refill on medication Xarelto . Patient hasn't had routine visit with PCP Medina-Vargas, Monina C, NP since 05/20/2022. Patient refill sent with 30 tablets and zero refills with pharmacy note that patient needs appointment.

## 2023-09-16 ENCOUNTER — Other Ambulatory Visit: Payer: Self-pay

## 2023-10-08 ENCOUNTER — Other Ambulatory Visit: Payer: Self-pay

## 2023-10-11 ENCOUNTER — Other Ambulatory Visit: Payer: Self-pay

## 2023-10-13 ENCOUNTER — Other Ambulatory Visit: Payer: Self-pay

## 2023-10-15 ENCOUNTER — Other Ambulatory Visit: Payer: Self-pay

## 2023-10-15 ENCOUNTER — Other Ambulatory Visit: Payer: Self-pay | Admitting: Adult Health

## 2023-10-15 DIAGNOSIS — Z86718 Personal history of other venous thrombosis and embolism: Secondary | ICD-10-CM

## 2023-10-15 MED ORDER — RIVAROXABAN 10 MG PO TABS
10.0000 mg | ORAL_TABLET | Freq: Every day | ORAL | 0 refills | Status: DC
  Filled 2023-10-15: qty 30, 30d supply, fill #0

## 2023-10-15 NOTE — Telephone Encounter (Signed)
 Spoke with patient this morning and was able to make appointment for a routine visit in office to see Phyllis Jereld BROCKS, NP on October 18, 2023 at 2:40 pm

## 2023-10-17 NOTE — Progress Notes (Deleted)
 [facility] clinic  Provider:  Jereld Serum DNP  Code Status: ***  Goals of Care:     12/22/2022    2:38 PM  Advanced Directives  Does Patient Have a Medical Advance Directive? No  Would patient like information on creating a medical advance directive? No - Patient declined     No chief complaint on file.   HPI: Patient is a 63 y.o. female seen today for an acute visit for Discussed the use of AI scribe software for clinical note transcription with the patient, who gave verbal consent to proceed.  History of Present Illness      Past Medical History:  Diagnosis Date   DVT (deep venous thrombosis) (HCC)    Headache(784.0)    otc meds prn    Heart attack (HCC) 11/22/2022   History of hysterectomy 2014   Hyperlipidemia    Hypertension 09/2018   Plantar warts 08/2019   SVD (spontaneous vaginal delivery)    x 5   Vitamin D  deficiency 09/2018    Past Surgical History:  Procedure Laterality Date   ABDOMINAL HYSTERECTOMY  04/06/2012   Procedure: HYSTERECTOMY ABDOMINAL;  Surgeon: Charlie CHRISTELLA Croak, MD;  Location: [facility];  Service: Gynecology;  Laterality: N/A;   BREAST SURGERY     benign turmor removed   CORONARY/GRAFT ACUTE MI REVASCULARIZATION N/A 11/21/2022   Procedure: Coronary/Graft Acute MI Revascularization;  Surgeon: Anner Alm ORN, MD;  Location: [facility] INVASIVE CV LAB;  Service: Cardiovascular;  Laterality: N/A;   SALPINGOOPHORECTOMY  04/06/2012   Procedure: SALPINGO OOPHORECTOMY;  Surgeon: Charlie CHRISTELLA Croak, MD;  Location: [facility];  Service: Gynecology;  Laterality: Bilateral;   TUBAL LIGATION      No Known Allergies  Outpatient Encounter Medications as of 10/18/2023  Medication Sig   albuterol  (VENTOLIN  HFA) 108 (90 Base) MCG/ACT inhaler Inhale 2 puffs into the lungs every 4 (four) hours (up to 6 times a day) as needed for shortness of breath   carvedilol  (COREG ) 12.5 MG tablet Take 1 tablet (12.5 mg total) by mouth 2 (two) times daily with a meal.    carvedilol  (COREG ) 12.5 MG tablet Take 1 tablet (12.5 mg total) by mouth in the morning and in evening.   clopidogrel  (PLAVIX ) 75 MG tablet Take 1 tablet (75 mg total) by mouth daily with breakfast.   hydrochlorothiazide  (HYDRODIURIL ) 12.5 MG tablet Take 1 tablet (12.5 mg total) by mouth daily.   hydrochlorothiazide  (HYDRODIURIL ) 12.5 MG tablet Take 1 tablet (12.5 mg total) by mouth every morning. (Patient not taking: Reported on 07/09/2023)   isosorbide  mononitrate (IMDUR ) 30 MG 24 hr tablet Take 1 tablet (30 mg total) by mouth daily.   NARCAN  4 MG/0.1ML LIQD nasal spray kit    nitroGLYCERIN  (NITROSTAT ) 0.4 MG SL tablet Place 1 tablet (0.4 mg total) under the tongue every 5 (five) minutes x 3 doses as needed for chest pain.   oxyCODONE -acetaminophen  (PERCOCET) 10-325 MG tablet Take 1 tablet by mouth 2 (two) times daily as needed.   rivaroxaban  (XARELTO ) 10 MG TABS tablet Take 1 tablet (10 mg total) by mouth daily.   rosuvastatin  (CRESTOR ) 20 MG tablet TAKE 1 TABLET BY MOUTH EVERY DAY   rosuvastatin  (CRESTOR ) 40 MG tablet Take 1 tablet (40 mg total) by mouth daily for cholesterol and vascular health.   Tiotropium Bromide  Monohydrate (SPIRIVA  RESPIMAT) 2.5 MCG/ACT AERS Take 2 Inhalations into the lungs daily.   tiZANidine  (ZANAFLEX ) 2 MG tablet Take 1 tablet (2 mg total) by mouth every 8 (eight)  hours as needed for muscle spasms.   Vitamin D , Ergocalciferol , (DRISDOL ) 1.25 MG (50000 UNIT) CAPS capsule Take 1 capsule (50,000 Units total) by mouth every 7 (seven) days.   No facility-administered encounter medications on file as of 10/18/2023.    Review of Systems:  Review of Systems  Health Maintenance  Topic Date Due   COVID-19 Vaccine (1) Never done   Pneumococcal Vaccine: 19-49 Years (1 of 2 - PCV) Never done   Pneumococcal Vaccine: 50+ Years (1 of 2 - PCV) Never done   Zoster Vaccines- Shingrix (1 of 2) Never done   Medicare Annual Wellness (AWV)  06/17/2023   INFLUENZA VACCINE  10/08/2023    MAMMOGRAM  03/23/2024   Colonoscopy  02/26/2032   Hepatitis C Screening  Completed   HIV Screening  Completed   Hepatitis B Vaccines  Aged Out   HPV VACCINES  Aged Out   Meningococcal B Vaccine  Aged Out   DTaP/Tdap/Td  Discontinued    Physical Exam: There were no vitals filed for this visit. There is no height or weight on file to calculate BMI. Physical Exam  Labs reviewed: Basic Metabolic Panel: Recent Labs    11/21/22 0634 11/21/22 0646 11/22/22 0507  NA  --  143  144 141  K  --  3.4*  3.4* 3.4*  CL  --  109 112*  CO2  --   --  21*  GLUCOSE  --  118* 96  BUN  --  12 11  CREATININE  --  0.60 0.79  CALCIUM   --   --  8.4*  TSH 1.796  --   --    Liver Function Tests: No results for input(s): AST, ALT, ALKPHOS, BILITOT, PROT, ALBUMIN in the last 8760 hours. No results for input(s): LIPASE, AMYLASE in the last 8760 hours. No results for input(s): AMMONIA in the last 8760 hours. CBC: Recent Labs    11/21/22 0646 11/22/22 0507  WBC  --  5.8  HGB 13.6  13.6 11.8*  HCT 40.0  40.0 36.4  MCV  --  93.3  PLT  --  208   Lipid Panel: Recent Labs    11/22/22 0507  CHOL 149  HDL 24*  LDLCALC 112*  TRIG 63  CHOLHDL 6.2   Lab Results  Component Value Date   HGBA1C 5.7 (H) 11/21/2022    Procedures since last visit: No results found.  Assessment/Plan Assessment and Plan Assessment & Plan     ***   Labs/tests ordered:  * No order type specified *   No follow-ups on file.  Terrence Pizana Medina-Vargas, NP

## 2023-10-18 ENCOUNTER — Ambulatory Visit: Admitting: Adult Health

## 2023-10-18 DIAGNOSIS — I10 Essential (primary) hypertension: Secondary | ICD-10-CM

## 2023-10-18 DIAGNOSIS — E782 Mixed hyperlipidemia: Secondary | ICD-10-CM

## 2023-10-18 DIAGNOSIS — I25119 Atherosclerotic heart disease of native coronary artery with unspecified angina pectoris: Secondary | ICD-10-CM

## 2023-10-18 DIAGNOSIS — Z86718 Personal history of other venous thrombosis and embolism: Secondary | ICD-10-CM

## 2023-10-20 ENCOUNTER — Ambulatory Visit: Admitting: Gastroenterology

## 2023-10-20 ENCOUNTER — Other Ambulatory Visit: Payer: Self-pay

## 2023-10-20 ENCOUNTER — Encounter: Payer: Self-pay | Admitting: Gastroenterology

## 2023-10-20 VITALS — BP 180/100 | HR 78 | Ht 65.0 in | Wt 192.0 lb

## 2023-10-20 DIAGNOSIS — Z1211 Encounter for screening for malignant neoplasm of colon: Secondary | ICD-10-CM

## 2023-10-20 DIAGNOSIS — R0789 Other chest pain: Secondary | ICD-10-CM

## 2023-10-20 DIAGNOSIS — R140 Abdominal distension (gaseous): Secondary | ICD-10-CM

## 2023-10-20 NOTE — Progress Notes (Signed)
 [facility] Consult Note:  History: [name] 10/20/2023  Referring provider: Medina-Vargas, Monina C, NP  Reason for consult/chief complaint: Abdominal Pain (Pt states she is having pain in her RUQ, pt states she has a lot of abd swelling, pt been having problems for 4 months )   Subjective  HPI:  This is a 63 year old woman referred by primary care for evaluation of right upper quadrant pain. [name] recalls this beginning approximately 4 months ago when she lifted something or turned a certain way and felt a pain in this area.  Since then there has been a burning and sharp pain that she feels worse laying on the side or with certain movements.  Not worsened or change with inspiration.  She did not have a fall or other trauma to that area and denies abuse. She was referred to another GI practice in town that she apparently saw in the past and where she may have had a colonoscopy.  She could not be sure but thinks it might be even GI, but when she contacted them after the initial referral was told she had been discharged and could not return there but did not know any more information about why. Lan denies nausea vomiting dysphagia loss of appetite or weight loss.  She has some intermittent abdominal bloating which she notices more when she is having this pain.  See last cardiology note from May 2025 for details of that condition.  She was having intermittent brief chest pain and was started on long-acting nitrate with plans for follow-up appointment in about 6 weeks (no visit since then).  ROS:  Review of Systems  Constitutional:  Negative for appetite change and unexpected weight change.  HENT:  Negative for mouth sores and voice change.   Eyes:  Negative for pain and redness.  Respiratory:  Negative for cough and shortness of breath.   Cardiovascular:  Negative for chest pain and palpitations.  Genitourinary:  Negative for dysuria and hematuria.   Musculoskeletal:  Negative for arthralgias and myalgias.  Skin:  Negative for pallor and rash.  Neurological:  Negative for weakness and headaches.  Hematological:  Negative for adenopathy.     Past Medical History: Past Medical History:  Diagnosis Date   DVT (deep venous thrombosis) (HCC)    Headache(784.0)    otc meds prn    Heart attack (HCC) 11/22/2022   History of hysterectomy 2014   Hyperlipidemia    Hypertension 09/2018   Plantar warts 08/2019   SVD (spontaneous vaginal delivery)    x 5   Vitamin D  deficiency 09/2018   07/09/23 Cardiology note:  1. CAD: S/p STEMI, DES-LCx in 11/2022.  She had residual 40% proximal to mid RCA stenosis. She has noted some increased shortness of breath with exertion, she has had 2 episodes of chest discomfort, she has not taken nitroglycerin  for her symptoms.  Symptoms lasted for minutes and resolved spontaneously.  Will trial Imdur  30 mg daily.  Should she have progressive symptoms concerning for angina, consider need for ischemic evaluation. Through shared decision making, will defer for now.  Reviewed ED precautions, nitroglycerin  use. No ASA in the setting of chronic DOAC therapy. Continue Plavix , carvedilol , amlodipine , and Crestor .   2.  PVCs: Noted on prior EKGs, asymptomatic.  Continue carvedilol .   3. History of DVT: On chronic Xarelto .  Managed per PCP.    4. Hypertension: BP improved with increased carvedilol . Continue to monitor BP greater than 130/80, monitor heart rate and  report heart rate consistently less than 50 bpm. Continue current antihypertensive regimen.   5. Hyperlipidemia: LDL was 112 in 11/2022. Will repeat fasting lipids, CMET.  Continue Crestor .   6. LV dysfunction/mitral valve regurgitation: Echocardiogram noted EF 65 to 70%, LV hyperdynamic function, RWMA, moderate hypokinesis of the left ventricular, basal inferior wall and inferolateral wall, normal RV systolic function, mildly elevated PASP, moderate mitral valve  regurgitation. Repeat echocardiogram in 02/2023 showed EF 60 to 65%, normal LV function, no RWMA, mild concentric LVH, normal RV systolic function, mild mitral valve regurgitation. Euvolemic and well compensated on exam.  Consider repeat echocardiogram as clinically indicated.  Past Surgical History: Past Surgical History:  Procedure Laterality Date   ABDOMINAL HYSTERECTOMY  04/06/2012   Procedure: HYSTERECTOMY ABDOMINAL;  Surgeon: Charlie CHRISTELLA Croak, MD;  Location: [facility];  Service: Gynecology;  Laterality: N/A;   BREAST SURGERY     benign turmor removed   CORONARY/GRAFT ACUTE MI REVASCULARIZATION N/A 11/21/2022   Procedure: Coronary/Graft Acute MI Revascularization;  Surgeon: Anner Alm ORN, MD;  Location: [facility] INVASIVE CV LAB;  Service: Cardiovascular;  Laterality: N/A;   SALPINGOOPHORECTOMY  04/06/2012   Procedure: SALPINGO OOPHORECTOMY;  Surgeon: Charlie CHRISTELLA Croak, MD;  Location: [facility];  Service: Gynecology;  Laterality: Bilateral;   TUBAL LIGATION       Family History: Family History  Problem Relation Age of Onset   Heart failure Mother    Hypertension Mother    Breast cancer Sister    Diabetes Sister     Social History: Social History   Socioeconomic History   Marital status: Married    Spouse name: Not on file   Number of children: 5   Years of education: Not on file   Highest education level: Not on file  Occupational History   Occupation: retired  Tobacco Use   Smoking status: Some Days    Current packs/day: 0.00    Average packs/day: 0.3 packs/day for 38.0 years (9.5 ttl pk-yrs)    Types: Cigarettes    Last attempt to quit: 11/21/2022    Years since quitting: 0.9   Smokeless tobacco: Never   Tobacco comments:    3 per day.   Vaping Use   Vaping status: Never Used  Substance and Sexual Activity   Alcohol use: No    Comment:     Drug use: No   Sexual activity: Yes    Birth control/protection: Surgical  Other Topics Concern   Not on file  Social History  Narrative   Not on file   Social Drivers of Health   Financial Resource Strain: Not on file  Food Insecurity: Not on file  Transportation Needs: No Transportation Needs (11/22/2022)   PRAPARE - Administrator, Civil Service (Medical): No    Lack of Transportation (Non-Medical): No  Physical Activity: Not on file  Stress: Not on file  Social Connections: Not on file    Allergies: No Known Allergies  Outpatient Meds: Current Outpatient Medications  Medication Sig Dispense Refill   albuterol  (VENTOLIN  HFA) 108 (90 Base) MCG/ACT inhaler Inhale 2 puffs into the lungs every 4 (four) hours (up to 6 times a day) as needed for shortness of breath 20.1 g 3   carvedilol  (COREG ) 12.5 MG tablet Take 1 tablet (12.5 mg total) by mouth 2 (two) times daily with a meal. 180 tablet 3   carvedilol  (COREG ) 12.5 MG tablet Take 1 tablet (12.5 mg total) by mouth in the morning and in evening. 180  tablet 3   clopidogrel  (PLAVIX ) 75 MG tablet Take 1 tablet (75 mg total) by mouth daily with breakfast. 90 tablet 3   hydrochlorothiazide  (HYDRODIURIL ) 12.5 MG tablet Take 1 tablet (12.5 mg total) by mouth daily. 90 tablet 3   hydrochlorothiazide  (HYDRODIURIL ) 12.5 MG tablet Take 1 tablet (12.5 mg total) by mouth every morning. 90 tablet 3   isosorbide  mononitrate (IMDUR ) 30 MG 24 hr tablet Take 1 tablet (30 mg total) by mouth daily. 90 tablet 3   NARCAN  4 MG/0.1ML LIQD nasal spray kit      nitroGLYCERIN  (NITROSTAT ) 0.4 MG SL tablet Place 1 tablet (0.4 mg total) under the tongue every 5 (five) minutes x 3 doses as needed for chest pain. 25 tablet 2   rivaroxaban  (XARELTO ) 10 MG TABS tablet Take 1 tablet (10 mg total) by mouth daily. 30 tablet 0   rosuvastatin  (CRESTOR ) 40 MG tablet Take 1 tablet (40 mg total) by mouth daily for cholesterol and vascular health. 90 tablet 3   Tiotropium Bromide  Monohydrate (SPIRIVA  RESPIMAT) 2.5 MCG/ACT AERS Take 2 Inhalations into the lungs daily. 12 g 3   tiZANidine   (ZANAFLEX ) 2 MG tablet Take 1 tablet (2 mg total) by mouth every 8 (eight) hours as needed for muscle spasms. 90 tablet 3   Vitamin D , Ergocalciferol , (DRISDOL ) 1.25 MG (50000 UNIT) CAPS capsule Take 1 capsule (50,000 Units total) by mouth every 7 (seven) days. 5 capsule 3   oxyCODONE -acetaminophen  (PERCOCET) 10-325 MG tablet Take 1 tablet by mouth 2 (two) times daily as needed. (Patient not taking: Reported on 10/20/2023) 60 tablet 0   rosuvastatin  (CRESTOR ) 20 MG tablet TAKE 1 TABLET BY MOUTH EVERY DAY (Patient not taking: Reported on 10/20/2023) 90 tablet 3   No current facility-administered medications for this visit.      ___________________________________________________________________ Objective   Exam:  BP (!) 180/100   Pulse 78   Ht 5' 5 (1.651 m)   Wt 192 lb (87.1 kg)   LMP 06/13/2011   BMI 31.95 kg/m  Wt Readings from Last 3 Encounters:  10/20/23 192 lb (87.1 kg)  07/09/23 188 lb 5.4 oz (85.4 kg)  04/14/23 182 lb (82.6 kg)    General: Well-appearing Eyes: sclera anicteric, no redness ENT: oral mucosa moist without lesions, no cervical or supraclavicular lymphadenopathy CV: Regular without appreciable murmur, no JVD, no peripheral edema Resp: clear to auscultation bilaterally, normal RR and effort noted GI: soft, no abdominal tenderness, with active bowel sounds. No guarding or palpable organomegaly noted. She has tenderness across the lower anterior lateral right rib cage with no palpable fullness or other abnormality and no skin abnormalities overlying the area Skin; warm and dry, no rash or jaundice noted Neuro: awake, alert and oriented x 3. Normal gross motor function and fluent speech  Labs:  CBC    Latest Ref Rng & Units 11/22/2022    5:07 AM 11/21/2022    6:46 AM 05/28/2022   11:38 AM  CBC  WBC 4.0 - 10.5 K/uL 5.8   4.0   Hemoglobin 12.0 - 15.0 g/dL 88.1  86.3    86.3  85.9   Hematocrit 36.0 - 46.0 % 36.4  40.0    40.0  43.3   Platelets 150 - 400  K/uL 208   247       Latest Ref Rng & Units 11/22/2022    5:07 AM 11/21/2022    6:46 AM 05/28/2022   11:38 AM  CMP  Glucose 70 - 99 mg/dL 96  118  86   BUN 8 - 23 mg/dL 11  12  16    Creatinine 0.44 - 1.00 mg/dL 9.20  9.39  9.27   Sodium 135 - 145 mmol/L 141  144    143  143   Potassium 3.5 - 5.1 mmol/L 3.4  3.4    3.4  4.2   Chloride 98 - 111 mmol/L 112  109  110   CO2 22 - 32 mmol/L 21   22   Calcium  8.9 - 10.3 mg/dL 8.4   9.5   Total Protein 6.1 - 8.1 g/dL   6.7   Total Bilirubin 0.2 - 1.2 mg/dL   0.5   AST 10 - 35 U/L   12   ALT 6 - 29 U/L   10     _______________________________    Dec 2024 Echo   1. Left ventricular ejection fraction, by estimation, is 60 to 65%. The  left ventricle has normal function. The left ventricle has no regional  wall motion abnormalities. There is mild concentric left ventricular  hypertrophy. Left ventricular diastolic  parameters were normal. GLS -25.2%.   2. Right ventricular systolic function is normal. The right ventricular  size is normal.   3. The mitral valve is normal in structure. Mild mitral valve  regurgitation. No evidence of mitral stenosis.   4. Tricuspid valve regurgitation is moderate.   5. The aortic valve is normal in structure. Aortic valve regurgitation is  not visualized. No aortic stenosis is present.   6. The inferior vena cava is normal in size with greater than 50%  respiratory variability, suggesting right atrial pressure of 3 mmHg.  ________________________________________  EXAMINATION: CT ABDOMEN W CONTRAST   CLINICAL INDICATION: Female, 63 years old. Right upper quadrant abdominal pain   TECHNIQUE: Axial CT of the abdomen with 100 cc. Severe 300 intravenous contrast. Multiplanar reformations provided. Unless otherwise specified, incidental thyroid , adrenal, renal lesions do not require dedicated imaging follow up. Additionally, any mentioned pulmonary nodules do not require dedicated imaging follow-up  based on the Fleischner guidelines unless otherwise specified. Coronary calcifications are not identified unless otherwise specified.   COMPARISON:   FINDINGS:   The lung bases demonstrate minimal atelectatic changes. The heart is normal in size. The liver appears normal. The gallbladder is normal. The spleen is normal. The pancreas is normal. The right adrenal is normal. 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 There is bilateral renal cortical scarring. Small bilateral nonobstructing intrarenal stones are seen. The abdominal aorta is normal in caliber. Visualized loops of large and small bowel appear within normal limits. No free fluid or pathologic lymphadenopathy by size criteria. There is diffuse osseous mineralization with degenerative changes of the spine.   IMPRESSION:   No acute findings in the abdomen.   DOSE REDUCTION: This exam was performed according to our departmental dose-optimization program which includes automated exposure control, adjustment of the mA and/or kV according to patient size and/or use of iterative reconstruction technique.   Electronically signed by: [place] Engel MD 07/08/2023     Assessment: Encounter Diagnoses  Name Primary?   Chest wall pain Yes   Abdominal bloating    Special screening for malignant neoplasms, colon     This patient has musculoskeletal chest wall pain without apparent preceding injury.  This is not GI in nature and is most consistent with costochondritis or other inflammatory cause. Nonspecific intermittent abdominal bloating Reassuring abdominal pelvic CT scan Unclear if she  is due for screening or surveillance colonoscopy  Plan:  Return to primary care for further attention to the chest wall pain.  In the interim, I recommended she put a lidocaine  patch on it and see if that helps.  She could not be sure which local GI practice she had seen but thought it might have been either GI.   We will send a record request so that if she has been there in the past, perhaps they we will send her last colonoscopy and (if applicable) pathology report so we can know when she may next be due for colonoscopy. That would have to wait until she is either off Plavix  or can hold it for 5 days, which would not likely be for at least a year since her MI and coronary intervention.  On that note, since she had not followed up after the last cardiology appointment and is coming up on a year from her MI, I strongly recommended she contact her cardiology office today for an appointment.  Thank you for the courtesy of this consult.  Please call me with any questions or concerns.  Victory LITTIE Brand III  CC: Referring provider noted above

## 2023-10-20 NOTE — Patient Instructions (Addendum)
 _______________________________________________________  If your blood pressure at your visit was 140/90 or greater, please contact your primary care physician to follow up on this.  _______________________________________________________  If you are age 63 or older, your body mass index should be between 23-30. Your Body mass index is 31.95 kg/m. If this is out of the aforementioned range listed, please consider follow up with your Primary Care Provider.  If you are age 5 or younger, your body mass index should be between 19-25. Your Body mass index is 31.95 kg/m. If this is out of the aformentioned range listed, please consider follow up with your Primary Care Provider.   ________________________________________________________  The [facility] providers would like to encourage you to use MYCHART to communicate with providers for non-urgent requests or questions.  Due to long hold times on the telephone, sending your provider a message by [facility] may be a faster and more efficient way to get a response.  Please allow 48 business hours for a response.  Please remember that this is for non-urgent requests.  _______________________________________________________  [facility] is using a team-based approach to care.  Your team is made up of your doctor and two to three APPS. Our APPS (Nurse Practitioners and Physician Assistants) work with your physician to ensure care continuity for you. They are fully qualified to address your health concerns and develop a treatment plan. They communicate directly with your gastroenterologist to care for you. Seeing the Advanced Practice Practitioners on your physician's team can help you by facilitating care more promptly, often allowing for earlier appointments, access to diagnostic testing, procedures, and other specialty referrals.    Thank you for trusting me with your gastrointestinal care!    Dr. Victory Legrand DOUGLAS [facility]

## 2023-10-21 ENCOUNTER — Other Ambulatory Visit: Payer: Self-pay

## 2023-10-26 ENCOUNTER — Other Ambulatory Visit: Payer: Self-pay

## 2023-10-27 ENCOUNTER — Other Ambulatory Visit: Payer: Self-pay

## 2023-11-04 ENCOUNTER — Other Ambulatory Visit: Payer: Self-pay

## 2023-11-04 ENCOUNTER — Encounter: Payer: Self-pay | Admitting: Adult Health

## 2023-11-04 ENCOUNTER — Ambulatory Visit (INDEPENDENT_AMBULATORY_CARE_PROVIDER_SITE_OTHER): Admitting: Adult Health

## 2023-11-04 VITALS — BP 160/80 | HR 78 | Temp 98.0°F | Ht 65.0 in | Wt 195.4 lb

## 2023-11-04 DIAGNOSIS — J439 Emphysema, unspecified: Secondary | ICD-10-CM

## 2023-11-04 DIAGNOSIS — Z6832 Body mass index (BMI) 32.0-32.9, adult: Secondary | ICD-10-CM

## 2023-11-04 DIAGNOSIS — M62838 Other muscle spasm: Secondary | ICD-10-CM

## 2023-11-04 DIAGNOSIS — I2510 Atherosclerotic heart disease of native coronary artery without angina pectoris: Secondary | ICD-10-CM | POA: Diagnosis not present

## 2023-11-04 DIAGNOSIS — R7303 Prediabetes: Secondary | ICD-10-CM | POA: Diagnosis not present

## 2023-11-04 DIAGNOSIS — Z86718 Personal history of other venous thrombosis and embolism: Secondary | ICD-10-CM | POA: Diagnosis not present

## 2023-11-04 DIAGNOSIS — I10 Essential (primary) hypertension: Secondary | ICD-10-CM | POA: Diagnosis not present

## 2023-11-04 DIAGNOSIS — E782 Mixed hyperlipidemia: Secondary | ICD-10-CM | POA: Diagnosis not present

## 2023-11-04 DIAGNOSIS — E559 Vitamin D deficiency, unspecified: Secondary | ICD-10-CM | POA: Diagnosis not present

## 2023-11-04 DIAGNOSIS — E66811 Obesity, class 1: Secondary | ICD-10-CM

## 2023-11-04 MED ORDER — HYDROCHLOROTHIAZIDE 25 MG PO TABS
25.0000 mg | ORAL_TABLET | Freq: Every day | ORAL | 3 refills | Status: AC
  Filled 2023-11-04: qty 90, 90d supply, fill #0
  Filled 2024-01-31 – 2024-02-14 (×2): qty 90, 90d supply, fill #1

## 2023-11-04 MED ORDER — CLONIDINE HCL 0.1 MG PO TABS
0.1000 mg | ORAL_TABLET | ORAL | Status: AC
Start: 2023-11-04 — End: 2023-11-04
  Administered 2023-11-04: 0.1 mg via ORAL

## 2023-11-04 NOTE — Progress Notes (Signed)
 [facility] [place] [facility]  Provider:  Jereld Serum DNP  Code Status:  Full Code  Goals of Care:     12/22/2022    2:38 PM  Advanced Directives  Does Patient Have a Medical Advance Directive? No  Would patient like information on creating a medical advance directive? No - Patient declined     Chief Complaint  Patient presents with   Medical Management of Chronic Issues    Routine checkup   Discussed the use of AI scribe software for clinical note transcription with the patient, who gave verbal consent to proceed.  HPI: Patient is a 63 y.o. female seen today for an acute visit for follow up on chronic medical issues. She presents for follow-up of her blood pressure management and medication review.  She experiences difficulty sleeping, often staying awake all night and only managing to sleep from 5 AM to 10 AM. She attributes her sleep issues to stress related to her living situation, which she describes as unsafe and infested with bed bugs. She is actively seeking a new place to live to alleviate her stress and improve her sleep. Muscle spasms at night also contribute to her sleep disturbances.  She has a history of hypertension and is currently taking Coreg  12.5 mg twice daily, Hydrodiuril  12.5 mg daily, and Imdur  30 mg daily. She reports that her blood pressure was 180/100 mmHg when measured at another facility. She experiences headaches occasionally, which she attributes to stress.  She has a history of coronary artery disease and suffered a heart attack in September of a previous year. She is on Plavix  75 mg daily, rosuvastatin  40 mg daily, and isosorbide  mononitrate 30 mg daily. She reports no recent chest pain and follows up with her cardiologist, although her last visit was in February.  She mentions a past episode of a blood clot in her right leg and is currently on Xarelto  10 mg daily.  She has COPD and uses Spiriva  2.5 mcg/act, two inhalations daily, and albuterol , two puffs  every four hours as needed. She experiences shortness of breath, especially after her heart attack, and uses albuterol  to manage these symptoms.  She reports abdominal pain and swelling, particularly in the right upper quadrant, which led to a gastroenterology consultation. A CT abdomen showed no acute findings, but she was informed of small bilateral renal stones and emphysema. She continues to experience significant abdominal discomfort and swelling.  Her current medications also include vitamin D  50,000 units weekly and tizanidine  2 mg every eight hours as needed for muscle spasms.  She has a history of hyperlipidemia and is on rosuvastatin  40 mg daily. She walks for 30 minutes twice daily as part of her exercise routine.  She has a history of prediabetes, with a BMI of 32.52, and is working on [employer]. She reports a significant weight gain since her last visit, now weighing 195 pounds, which she attributes to inactivity and stress.    Past Medical History:  Diagnosis Date   DVT (deep venous thrombosis) (HCC)    Headache(784.0)    otc meds prn    Heart attack (HCC) 11/22/2022   History of hysterectomy 2014   Hyperlipidemia    Hypertension 09/2018   Plantar warts 08/2019   SVD (spontaneous vaginal delivery)    x 5   Vitamin D  deficiency 09/2018    Past Surgical History:  Procedure Laterality Date   ABDOMINAL HYSTERECTOMY  04/06/2012   Procedure: HYSTERECTOMY ABDOMINAL;  Surgeon: Charlie CHRISTELLA Croak, MD;  Location: [facility];  Service: Gynecology;  Laterality: N/A;   BREAST SURGERY     benign turmor removed   CORONARY/GRAFT ACUTE MI REVASCULARIZATION N/A 11/21/2022   Procedure: Coronary/Graft Acute MI Revascularization;  Surgeon: Anner Alm ORN, MD;  Location: [facility] INVASIVE CV LAB;  Service: Cardiovascular;  Laterality: N/A;   SALPINGOOPHORECTOMY  04/06/2012   Procedure: SALPINGO OOPHORECTOMY;  Surgeon: Charlie CHRISTELLA Croak, MD;  Location: [facility];  Service: Gynecology;  Laterality:  Bilateral;   TUBAL LIGATION      No Known Allergies  Outpatient Encounter Medications as of 11/04/2023  Medication Sig   albuterol  (VENTOLIN  HFA) 108 (90 Base) MCG/ACT inhaler Inhale 2 puffs into the lungs every 4 (four) hours (up to 6 times a day) as needed for shortness of breath   carvedilol  (COREG ) 12.5 MG tablet Take 1 tablet (12.5 mg total) by mouth 2 (two) times daily with a meal.   clopidogrel  (PLAVIX ) 75 MG tablet Take 1 tablet (75 mg total) by mouth daily with breakfast.   hydrochlorothiazide  (HYDRODIURIL ) 25 MG tablet Take 1 tablet (25 mg total) by mouth daily.   isosorbide  mononitrate (IMDUR ) 30 MG 24 hr tablet Take 1 tablet (30 mg total) by mouth daily.   nitroGLYCERIN  (NITROSTAT ) 0.4 MG SL tablet Place 1 tablet (0.4 mg total) under the tongue every 5 (five) minutes x 3 doses as needed for chest pain.   rivaroxaban  (XARELTO ) 10 MG TABS tablet Take 1 tablet (10 mg total) by mouth daily.   rosuvastatin  (CRESTOR ) 40 MG tablet Take 1 tablet (40 mg total) by mouth daily for cholesterol and vascular health.   Tiotropium Bromide  Monohydrate (SPIRIVA  RESPIMAT) 2.5 MCG/ACT AERS Take 2 Inhalations into the lungs daily.   tiZANidine  (ZANAFLEX ) 2 MG tablet Take 1 tablet (2 mg total) by mouth every 8 (eight) hours as needed for muscle spasms.   Vitamin D , Ergocalciferol , (DRISDOL ) 1.25 MG (50000 UNIT) CAPS capsule Take 1 capsule (50,000 Units total) by mouth every 7 (seven) days.   [DISCONTINUED] hydrochlorothiazide  (HYDRODIURIL ) 12.5 MG tablet Take 1 tablet (12.5 mg total) by mouth daily.   [DISCONTINUED] NARCAN  4 MG/0.1ML LIQD nasal spray kit as needed.   [DISCONTINUED] carvedilol  (COREG ) 12.5 MG tablet Take 1 tablet (12.5 mg total) by mouth in the morning and in evening. (Patient not taking: Reported on 11/04/2023)   [DISCONTINUED] hydrochlorothiazide  (HYDRODIURIL ) 12.5 MG tablet Take 1 tablet (12.5 mg total) by mouth every morning. (Patient not taking: Reported on 11/04/2023)   [DISCONTINUED]  oxyCODONE -acetaminophen  (PERCOCET) 10-325 MG tablet Take 1 tablet by mouth 2 (two) times daily as needed. (Patient not taking: Reported on 11/04/2023)   [DISCONTINUED] rosuvastatin  (CRESTOR ) 20 MG tablet TAKE 1 TABLET BY MOUTH EVERY DAY (Patient not taking: Reported on 11/04/2023)   [EXPIRED] cloNIDine  (CATAPRES ) tablet 0.1 mg    No facility-administered encounter medications on file as of 11/04/2023.    Review of Systems:  Review of Systems  Constitutional:  Negative for appetite change, chills, fatigue and fever.  HENT:  Negative for congestion, hearing loss, rhinorrhea and sore throat.   Eyes: Negative.   Respiratory:  Negative for cough, shortness of breath and wheezing.   Cardiovascular:  Negative for chest pain, palpitations and leg swelling.  Gastrointestinal:  Negative for abdominal pain, constipation, diarrhea, nausea and vomiting.  Genitourinary:  Negative for dysuria.  Musculoskeletal:  Negative for arthralgias, back pain and myalgias.  Skin:  Negative for color change, rash and wound.  Neurological:  Negative for dizziness, weakness and headaches.  Psychiatric/Behavioral:  Negative for behavioral problems. The patient is not nervous/anxious.     Health Maintenance  Topic Date Due   Zoster Vaccines- Shingrix (1 of 2) Never done   Medicare Annual Wellness (AWV)  06/17/2023   COVID-19 Vaccine (1) 11/20/2023 (Originally 03/26/1965)   INFLUENZA VACCINE  06/06/2024 (Originally 10/08/2023)   Pneumococcal Vaccine: 50+ Years (1 of 2 - PCV) 11/03/2024 (Originally 03/27/1979)   MAMMOGRAM  03/23/2024   Colonoscopy  02/26/2032   Hepatitis C Screening  Completed   HIV Screening  Completed   Hepatitis B Vaccines 19-59 Average Risk  Aged Out   HPV VACCINES  Aged Out   Meningococcal B Vaccine  Aged Out   DTaP/Tdap/Td  Discontinued    Physical Exam: Vitals:   11/04/23 1420 11/04/23 1426 11/04/23 1452  BP: (!) 160/80 (!) 178/74 (!) 160/80  Pulse: 78    Temp: 98 F (36.7 C)    SpO2:  97%    Weight: 195 lb 6.4 oz (88.6 kg)    Height: 5' 5 (1.651 m)     Body mass index is 32.52 kg/m. Physical Exam Constitutional:      General: She is not in acute distress.    Appearance: She is obese.  HENT:     Head: Normocephalic and atraumatic.     Nose: Nose normal.     Mouth/Throat:     Mouth: Mucous membranes are moist.  Eyes:     Conjunctiva/sclera: Conjunctivae normal.  Cardiovascular:     Rate and Rhythm: Normal rate and regular rhythm.  Pulmonary:     Effort: Pulmonary effort is normal.     Breath sounds: Normal breath sounds.  Abdominal:     General: Bowel sounds are normal.     Palpations: Abdomen is soft.  Musculoskeletal:        General: Normal range of motion.     Cervical back: Normal range of motion.  Skin:    General: Skin is warm and dry.  Neurological:     General: No focal deficit present.     Mental Status: She is alert and oriented to person, place, and time.  Psychiatric:        Mood and Affect: Mood normal.        Behavior: Behavior normal.        Thought Content: Thought content normal.        Judgment: Judgment normal.     Labs reviewed: Basic Metabolic Panel: Recent Labs    11/21/22 0634 11/21/22 0646 11/22/22 0507  NA  --  143  144 141  K  --  3.4*  3.4* 3.4*  CL  --  109 112*  CO2  --   --  21*  GLUCOSE  --  118* 96  BUN  --  12 11  CREATININE  --  0.60 0.79  CALCIUM   --   --  8.4*  TSH 1.796  --   --    Liver Function Tests: No results for input(s): AST, ALT, ALKPHOS, BILITOT, PROT, ALBUMIN in the last 8760 hours. No results for input(s): LIPASE, AMYLASE in the last 8760 hours. No results for input(s): AMMONIA in the last 8760 hours. CBC: Recent Labs    11/21/22 0646 11/22/22 0507  WBC  --  5.8  HGB 13.6  13.6 11.8*  HCT 40.0  40.0 36.4  MCV  --  93.3  PLT  --  208   Lipid Panel: Recent Labs    11/22/22 0507  CHOL 149  HDL 24*  LDLCALC 112*  TRIG 63  CHOLHDL 6.2   Lab Results   Component Value Date   HGBA1C 5.7 (H) 11/21/2022    Procedures since last visit: No results found.  Assessment/Plan  1. Uncontrolled hypertension (Primary) -  BP 160/80, 178/74, 160/80 - cloNIDine  (CATAPRES ) tablet 0.1 mg PO X 1 -  Blood pressure elevated. Headaches likely stress-related. Current medications: Coreg  and Hydrodiuril . - Increase Hydrodiuril  to 25 mg daily. - Monitor blood pressure at home and log readings. - Follow up in two weeks for blood pressure check. - Advise on low-fat, low-carb diet and exercise. - hydrochlorothiazide  (HYDRODIURIL ) 25 MG tablet; Take 1 tablet (25 mg total) by mouth daily.  Dispense: 90 tablet; Refill: 3  2. Coronary artery disease involving native coronary artery of native heart without angina pectoris -  No recent chest pain. Pending cardiology follow-up. - Continue Plavix  75 mg daily. - Continue rosuvastatin  40 mg daily. - Continue isosorbide  mononitrate 30 mg daily. - Follow up with cardiology. - Comprehensive metabolic panel; Future - Hemoglobin A1c; Future - CBC with Differential/Platelets; Future - TSH; Future  3. Pulmonary emphysema, unspecified emphysema type (HCC) -  sing Spiriva  and albuterol  inhalers. - Continue Spiriva  2.5 mcg/ACT, two inhalations daily. - Continue albuterol  inhaler, two puffs every four hours as needed. - Contact [facility] for CT chest follow-up.  4. Mixed hyperlipidemia -  Managed with rosuvastatin  40 mg daily. Discontinued 20 mg to avoid duplication. - Continue rosuvastatin  40 mg daily. - Lipid panel; Future  5. Class 1 obesity with body mass index (BMI) of 32.0 to 32.9 in adult, unspecified obesity type, unspecified whether serious comorbidity present -  BMI 32.52. Reports weight gain and increased abdominal girth. Engages in regular walking. - Advise on low-fat, low-carb diet. - Encourage continued exercise, 30 minutes of walking twice daily.  6. Prediabetes -  Previously identified as  prediabetic. Current BMI and dietary habits suggest ongoing risk. - Check A1c during next fasting lab visit. - Advise on dietary modifications to reduce carbohydrate intake. - Hemoglobin A1c; Future  7. Muscle spasms of both lower extremities -  Experiences muscle spasms, particularly at night. Managed with tizanidine . - Continue tizanidine  2 mg every eight hours as needed.  8. History of DVT (deep vein thrombosis) -  continue Xarelto  10 mg daily  9. Vitamin D  deficiency -  continue Vitamin D  50,000 units weekly - Vitamin D , 25-hydroxy; Future     Labs/tests ordered:   CBC, CMP, lipid panel, A1C, TSH, tsh   Return in about 2 weeks (around 11/18/2023).  Mairlyn Tegtmeyer Medina-Vargas, NP

## 2023-11-09 ENCOUNTER — Other Ambulatory Visit

## 2023-11-09 ENCOUNTER — Other Ambulatory Visit: Payer: Self-pay

## 2023-11-11 ENCOUNTER — Other Ambulatory Visit

## 2023-11-11 DIAGNOSIS — I2510 Atherosclerotic heart disease of native coronary artery without angina pectoris: Secondary | ICD-10-CM

## 2023-11-11 DIAGNOSIS — E559 Vitamin D deficiency, unspecified: Secondary | ICD-10-CM | POA: Diagnosis not present

## 2023-11-11 DIAGNOSIS — R7303 Prediabetes: Secondary | ICD-10-CM | POA: Diagnosis not present

## 2023-11-11 DIAGNOSIS — E782 Mixed hyperlipidemia: Secondary | ICD-10-CM

## 2023-11-12 ENCOUNTER — Ambulatory Visit: Payer: Self-pay | Admitting: Adult Health

## 2023-11-12 LAB — CBC WITH DIFFERENTIAL/PLATELET
Absolute Lymphocytes: 1473 {cells}/uL (ref 850–3900)
Absolute Monocytes: 426 {cells}/uL (ref 200–950)
Basophils Absolute: 28 {cells}/uL (ref 0–200)
Basophils Relative: 0.5 %
Eosinophils Absolute: 90 {cells}/uL (ref 15–500)
Eosinophils Relative: 1.6 %
HCT: 42.2 % (ref 35.0–45.0)
Hemoglobin: 13.5 g/dL (ref 11.7–15.5)
MCH: 29.9 pg (ref 27.0–33.0)
MCHC: 32 g/dL (ref 32.0–36.0)
MCV: 93.4 fL (ref 80.0–100.0)
MPV: 10.7 fL (ref 7.5–12.5)
Monocytes Relative: 7.6 %
Neutro Abs: 3584 {cells}/uL (ref 1500–7800)
Neutrophils Relative %: 64 %
Platelets: 259 Thousand/uL (ref 140–400)
RBC: 4.52 Million/uL (ref 3.80–5.10)
RDW: 12.9 % (ref 11.0–15.0)
Total Lymphocyte: 26.3 %
WBC: 5.6 Thousand/uL (ref 3.8–10.8)

## 2023-11-12 LAB — COMPREHENSIVE METABOLIC PANEL WITH GFR
AG Ratio: 1.7 (calc) (ref 1.0–2.5)
ALT: 9 U/L (ref 6–29)
AST: 10 U/L (ref 10–35)
Albumin: 4.2 g/dL (ref 3.6–5.1)
Alkaline phosphatase (APISO): 70 U/L (ref 37–153)
BUN: 25 mg/dL (ref 7–25)
CO2: 25 mmol/L (ref 20–32)
Calcium: 9.5 mg/dL (ref 8.6–10.4)
Chloride: 108 mmol/L (ref 98–110)
Creat: 0.85 mg/dL (ref 0.50–1.05)
Globulin: 2.5 g/dL (ref 1.9–3.7)
Glucose, Bld: 97 mg/dL (ref 65–99)
Potassium: 3.8 mmol/L (ref 3.5–5.3)
Sodium: 142 mmol/L (ref 135–146)
Total Bilirubin: 0.4 mg/dL (ref 0.2–1.2)
Total Protein: 6.7 g/dL (ref 6.1–8.1)
eGFR: 77 mL/min/1.73m2 (ref >=60)

## 2023-11-12 LAB — HEMOGLOBIN A1C
Hgb A1c MFr Bld: 5.7 % — ABNORMAL HIGH (ref <5.7)
Mean Plasma Glucose: 117 mg/dL
eAG (mmol/L): 6.5 mmol/L

## 2023-11-12 LAB — LIPID PANEL
Cholesterol: 223 mg/dL — ABNORMAL HIGH (ref <200)
HDL: 38 mg/dL — ABNORMAL LOW (ref >=50)
LDL Cholesterol (Calc): 169 mg/dL — ABNORMAL HIGH
Non-HDL Cholesterol (Calc): 185 mg/dL — ABNORMAL HIGH (ref <130)
Total CHOL/HDL Ratio: 5.9 (calc) — ABNORMAL HIGH (ref <5.0)
Triglycerides: 69 mg/dL (ref <150)

## 2023-11-12 LAB — VITAMIN D 25 HYDROXY (VIT D DEFICIENCY, FRACTURES): Vit D, 25-Hydroxy: 55 ng/mL (ref 30–100)

## 2023-11-12 LAB — TSH: TSH: 0.62 m[IU]/L (ref 0.40–4.50)

## 2023-11-12 NOTE — Progress Notes (Signed)
-    electrolytes, liver enzymes, vitamin d  level, tsh, all normal -  no anemia  -   cholesterol 223, up from 149 and LDL 169, up from 112, will need to exercise for at least 150 minutes per weak, low fat diet and continue Rosuvastatin  40 mg daily

## 2023-11-15 ENCOUNTER — Other Ambulatory Visit: Payer: Self-pay | Admitting: Adult Health

## 2023-11-15 ENCOUNTER — Other Ambulatory Visit: Payer: Self-pay

## 2023-11-15 DIAGNOSIS — Z86718 Personal history of other venous thrombosis and embolism: Secondary | ICD-10-CM

## 2023-11-16 ENCOUNTER — Other Ambulatory Visit: Payer: Self-pay

## 2023-11-16 MED ORDER — RIVAROXABAN 10 MG PO TABS
10.0000 mg | ORAL_TABLET | Freq: Every day | ORAL | 5 refills | Status: AC
  Filled 2023-11-16: qty 30, 30d supply, fill #0
  Filled 2023-12-13: qty 30, 30d supply, fill #1
  Filled 2024-01-12 – 2024-01-26 (×2): qty 30, 30d supply, fill #2
  Filled 2024-02-21: qty 30, 30d supply, fill #3
  Filled 2024-03-20: qty 30, 30d supply, fill #4

## 2023-11-18 ENCOUNTER — Ambulatory Visit: Admitting: Adult Health

## 2023-11-18 DIAGNOSIS — I10 Essential (primary) hypertension: Secondary | ICD-10-CM

## 2023-11-18 DIAGNOSIS — J439 Emphysema, unspecified: Secondary | ICD-10-CM

## 2023-11-18 DIAGNOSIS — Z86718 Personal history of other venous thrombosis and embolism: Secondary | ICD-10-CM

## 2023-11-18 DIAGNOSIS — I25119 Atherosclerotic heart disease of native coronary artery with unspecified angina pectoris: Secondary | ICD-10-CM

## 2023-11-19 ENCOUNTER — Encounter: Payer: Self-pay | Admitting: Adult Health

## 2023-11-19 ENCOUNTER — Ambulatory Visit: Payer: Self-pay | Admitting: Adult Health

## 2023-11-19 ENCOUNTER — Ambulatory Visit
Admission: RE | Admit: 2023-11-19 | Discharge: 2023-11-19 | Disposition: A | Discharge status: Home / Self Care | Source: Ambulatory Visit | Attending: Adult Health | Admitting: Adult Health

## 2023-11-19 ENCOUNTER — Other Ambulatory Visit: Payer: Self-pay

## 2023-11-19 ENCOUNTER — Ambulatory Visit (INDEPENDENT_AMBULATORY_CARE_PROVIDER_SITE_OTHER): Admitting: Adult Health

## 2023-11-19 VITALS — BP 142/69 | HR 70 | Temp 97.6°F | Resp 18 | Ht 65.0 in | Wt 195.6 lb

## 2023-11-19 DIAGNOSIS — I10 Essential (primary) hypertension: Secondary | ICD-10-CM

## 2023-11-19 DIAGNOSIS — R7303 Prediabetes: Secondary | ICD-10-CM

## 2023-11-19 DIAGNOSIS — Z86718 Personal history of other venous thrombosis and embolism: Secondary | ICD-10-CM | POA: Diagnosis not present

## 2023-11-19 DIAGNOSIS — E782 Mixed hyperlipidemia: Secondary | ICD-10-CM

## 2023-11-19 DIAGNOSIS — R0781 Pleurodynia: Secondary | ICD-10-CM

## 2023-11-19 DIAGNOSIS — J439 Emphysema, unspecified: Secondary | ICD-10-CM

## 2023-11-19 MED ORDER — LOSARTAN POTASSIUM 25 MG PO TABS
25.0000 mg | ORAL_TABLET | Freq: Every day | ORAL | 3 refills | Status: DC
  Filled 2023-11-19 – 2023-12-01 (×2): qty 30, 30d supply, fill #0
  Filled 2023-12-27: qty 30, 30d supply, fill #1
  Filled 2024-01-24: qty 30, 30d supply, fill #2
  Filled 2024-02-23: qty 30, 30d supply, fill #3

## 2023-11-19 NOTE — Progress Notes (Signed)
 [facility]  Provider:  Jereld Serum DNP  Code Status:  Full Code  Goals of Care:     12/22/2022    2:38 PM  Advanced Directives  Does Patient Have a Medical Advance Directive? No  Would patient like information on creating a medical advance directive? No - Patient declined     Chief Complaint  Patient presents with   Medical Management of Chronic Issues     2 weeks    Discussed the use of AI scribe software for clinical note transcription with the patient, who gave verbal consent to proceed.   HPI: Patient is a 63 y.o. female seen today for an acute visit for hypertension follow up.  She has been experiencing elevated blood pressure readings at home, such as 161/100 mmHg, which she attributes to stress from her living situation. She is currently taking Coreg  12.5 mg twice daily, hydrochlorothiazide  25 mg daily, and Imdur  30 mg daily for hypertension. She also reports difficulty sleeping due to stress and environmental factors like dust mites.  She has a history of heart attack in May 2015. She is on Plavix  for CAD and Xarelto  for blood DVT on her right leg. She takes rosuvastatin  40 mg daily for hyperlipidemia, with recent cholesterol levels at 223 mg/dL and LDL at 830 mg/dL.  She reports chronic right rib pain for the past six months, rated as 9 out of 10 in severity, located in the right rib cage and associated with swelling on the right side of her abdomen. A CT scan of the abdomen showed no acute findings, but she feels the scan did not cover the area of concern. She takes Zanaflex  2 mg as needed for this pain. No recent trauma or accidents are reported.  She has COPD and uses albuterol  as needed and Spiriva , two inhalations daily. She reports regular bowel movements but experiences bloating and abdominal distension at times.  She is experiencing stress related to her living situation, impacting her sleep and overall well-being.    Past Medical History:   Diagnosis Date   DVT (deep venous thrombosis) (HCC)    Headache(784.0)    otc meds prn    Heart attack (HCC) 11/22/2022   History of hysterectomy 2014   Hyperlipidemia    Hypertension 09/2018   Plantar warts 08/2019   SVD (spontaneous vaginal delivery)    x 5   Vitamin D  deficiency 09/2018    Past Surgical History:  Procedure Laterality Date   ABDOMINAL HYSTERECTOMY  04/06/2012   Procedure: HYSTERECTOMY ABDOMINAL;  Surgeon: Charlie CHRISTELLA Croak, MD;  Location: [facility];  Service: Gynecology;  Laterality: N/A;   BREAST SURGERY     benign turmor removed   CORONARY/GRAFT ACUTE MI REVASCULARIZATION N/A 11/21/2022   Procedure: Coronary/Graft Acute MI Revascularization;  Surgeon: Anner Alm ORN, MD;  Location: [facility] INVASIVE CV LAB;  Service: Cardiovascular;  Laterality: N/A;   SALPINGOOPHORECTOMY  04/06/2012   Procedure: SALPINGO OOPHORECTOMY;  Surgeon: Charlie CHRISTELLA Croak, MD;  Location: [facility];  Service: Gynecology;  Laterality: Bilateral;   TUBAL LIGATION      No Known Allergies  Outpatient Encounter Medications as of 11/19/2023  Medication Sig   albuterol  (VENTOLIN  HFA) 108 (90 Base) MCG/ACT inhaler Inhale 2 puffs into the lungs every 4 (four) hours (up to 6 times a day) as needed for shortness of breath   carvedilol  (COREG ) 12.5 MG tablet Take 1 tablet (12.5 mg total) by mouth 2 (two) times daily with a meal.   clopidogrel  (  PLAVIX ) 75 MG tablet Take 1 tablet (75 mg total) by mouth daily with breakfast.   hydrochlorothiazide  (HYDRODIURIL ) 25 MG tablet Take 1 tablet (25 mg total) by mouth daily.   isosorbide  mononitrate (IMDUR ) 30 MG 24 hr tablet Take 1 tablet (30 mg total) by mouth daily.   losartan  (COZAAR ) 25 MG tablet Take 1 tablet (25 mg total) by mouth daily.   nitroGLYCERIN  (NITROSTAT ) 0.4 MG SL tablet Place 1 tablet (0.4 mg total) under the tongue every 5 (five) minutes x 3 doses as needed for chest pain.   rivaroxaban  (XARELTO ) 10 MG TABS tablet Take 1 tablet (10 mg total) by mouth  daily.   rosuvastatin  (CRESTOR ) 40 MG tablet Take 1 tablet (40 mg total) by mouth daily for cholesterol and vascular health.   Tiotropium Bromide  Monohydrate (SPIRIVA  RESPIMAT) 2.5 MCG/ACT AERS Take 2 Inhalations into the lungs daily.   tiZANidine  (ZANAFLEX ) 2 MG tablet Take 1 tablet (2 mg total) by mouth every 8 (eight) hours as needed for muscle spasms.   Vitamin D , Ergocalciferol , (DRISDOL ) 1.25 MG (50000 UNIT) CAPS capsule Take 1 capsule (50,000 Units total) by mouth every 7 (seven) days.   No facility-administered encounter medications on file as of 11/19/2023.    Review of Systems:  Review of Systems  Constitutional:  Negative for appetite change, chills, fatigue and fever.  HENT:  Negative for congestion, hearing loss, rhinorrhea and sore throat.   Eyes: Negative.   Respiratory:  Negative for cough, shortness of breath and wheezing.   Cardiovascular:  Negative for chest pain, palpitations and leg swelling.  Gastrointestinal:  Negative for abdominal pain, constipation, diarrhea, nausea and vomiting.  Genitourinary:  Negative for dysuria.  Musculoskeletal:  Negative for arthralgias, back pain and myalgias.       Right rib pain  Skin:  Negative for color change, rash and wound.  Neurological:  Negative for dizziness, weakness and headaches.  Psychiatric/Behavioral:  Negative for behavioral problems. The patient is not nervous/anxious.     Health Maintenance  Topic Date Due   Zoster Vaccines- Shingrix (1 of 2) Never done   Medicare Annual Wellness (AWV)  06/17/2023   COVID-19 Vaccine (1) 11/20/2023 (Originally 03/26/1965)   Influenza Vaccine  06/06/2024 (Originally 10/08/2023)   Pneumococcal Vaccine: 50+ Years (1 of 2 - PCV) 11/03/2024 (Originally 03/27/1979)   Mammogram  03/23/2024   Colonoscopy  02/26/2032   Hepatitis C Screening  Completed   HIV Screening  Completed   Hepatitis B Vaccines 19-59 Average Risk  Aged Out   HPV VACCINES  Aged Out   Meningococcal B Vaccine  Aged Out    DTaP/Tdap/Td  Discontinued    Physical Exam: Vitals:   11/19/23 1136 11/19/23 1141  BP: (!) 142/62 (!) 142/69  Pulse: 70   Resp: 18   Temp: 97.6 F (36.4 C)   SpO2: 97%   Weight: 195 lb 9.6 oz (88.7 kg)   Height: 5' 5 (1.651 m)    Body mass index is 32.55 kg/m. Physical Exam Constitutional:      General: She is not in acute distress.    Appearance: She is obese.  HENT:     Head: Normocephalic and atraumatic.     Nose: Nose normal.     Mouth/Throat:     Mouth: Mucous membranes are moist.  Eyes:     Conjunctiva/sclera: Conjunctivae normal.  Cardiovascular:     Rate and Rhythm: Normal rate and regular rhythm.  Pulmonary:     Effort: Pulmonary effort is normal.  Breath sounds: Normal breath sounds.  Abdominal:     General: Bowel sounds are normal.     Palpations: Abdomen is soft.  Musculoskeletal:        General: Normal range of motion.     Cervical back: Normal range of motion.  Skin:    General: Skin is warm and dry.  Neurological:     General: No focal deficit present.     Mental Status: She is alert and oriented to person, place, and time.  Psychiatric:        Mood and Affect: Mood normal.        Behavior: Behavior normal.        Thought Content: Thought content normal.        Judgment: Judgment normal.     Labs reviewed: Basic Metabolic Panel: Recent Labs    11/21/22 0634 11/21/22 0646 11/22/22 0507 11/11/23 1307  NA  --  143  144 141 142  K  --  3.4*  3.4* 3.4* 3.8  CL  --  109 112* 108  CO2  --   --  21* 25  GLUCOSE  --  118* 96 97  BUN  --  12 11 25   CREATININE  --  0.60 0.79 0.85  CALCIUM   --   --  8.4* 9.5  TSH 1.796  --   --  0.62   Liver Function Tests: Recent Labs    11/11/23 1307  AST 10  ALT 9  BILITOT 0.4  PROT 6.7   No results for input(s): LIPASE, AMYLASE in the last 8760 hours. No results for input(s): AMMONIA in the last 8760 hours. CBC: Recent Labs    11/21/22 0646 11/22/22 0507 11/11/23 1307  WBC   --  5.8 5.6  NEUTROABS  --   --  3,584  HGB 13.6  13.6 11.8* 13.5  HCT 40.0  40.0 36.4 42.2  MCV  --  93.3 93.4  PLT  --  208 259   Lipid Panel: Recent Labs    11/22/22 0507 11/11/23 1307  CHOL 149 223*  HDL 24* 38*  LDLCALC 112* 830*  TRIG 63 69  CHOLHDL 6.2 5.9*   Lab Results  Component Value Date   HGBA1C 5.7 (H) 11/11/2023    Procedures since last visit: No results found.  Assessment/Plan  1. Primary hypertension (Primary) -  remains uncontrolled with recent readings of 161/100 mmHg and 161 mmHg systolic. Current medications include Coreg , hydrochlorothiazide , and Imdur . Recent stress and environmental factors may contribute to elevated blood pressure. Kidney function is normal, and there are no allergies reported. - Add losartan  25 mg daily to current antihypertensive regimen. - Instruct to monitor blood pressure daily. - Schedule follow-up in 2 weeks to reassess blood pressure control. - losartan  (COZAAR ) 25 MG tablet; Take 1 tablet (25 mg total) by mouth daily.  Dispense: 30 tablet; Refill: 3  2. Rib pain on right side -  right rib cage pain persisting for 6 months, rated 9/10 in severity. Previous CT of the abdomen showed no acute findings. Pain is localized to the right rib cage with associated swelling. - Order right rib x-ray to evaluate for underlying causes of pain. - Continue Zanaflex  2 mg every 8 hours as needed for pain management. - DG Ribs Unilateral W/Chest Right  3. Prediabetes -  with HbA1c of 5.7%. Emphasis on lifestyle modifications including diet and exercise to prevent progression to diabetes. - Encourage exercise for at least 150 minutes per week. -  Advise low-fat diet with increased vegetable intake.  4. Mixed hyperlipidemia -  with total cholesterol of 223 mg/dL and LDL of 830 mg/dL. Currently on rosuvastatin  40 mg daily. Emphasis on lifestyle modifications to improve lipid profile. - Continue rosuvastatin  40 mg daily. - Encourage  exercise and low-fat diet. - Plans to join [facility] for regular physical activity.  5. Pulmonary emphysema, unspecified emphysema type (HCC) -  managed with albuterol  as needed and Spiriva  two inhalations daily. No acute exacerbations reported.  6. History of DVT (deep vein thrombosis) -  DVT in the right leg, managed with Xarelto  10 mg daily. - Continue Xarelto  10 mg daily.      Labs/tests ordered:   x-ray of Right rib   Return in about 2 weeks (around 12/03/2023).  Sophonie Goforth Medina-Vargas, NP

## 2023-11-19 NOTE — Progress Notes (Signed)
-   no fracture of ribs

## 2023-11-25 ENCOUNTER — Other Ambulatory Visit: Payer: Self-pay

## 2023-11-30 ENCOUNTER — Other Ambulatory Visit: Payer: Self-pay

## 2023-12-01 ENCOUNTER — Other Ambulatory Visit: Payer: Self-pay

## 2023-12-02 ENCOUNTER — Other Ambulatory Visit: Payer: Self-pay

## 2023-12-03 ENCOUNTER — Other Ambulatory Visit (HOSPITAL_COMMUNITY): Payer: Self-pay

## 2023-12-03 ENCOUNTER — Encounter: Admitting: Adult Health

## 2023-12-03 ENCOUNTER — Other Ambulatory Visit: Payer: Self-pay

## 2023-12-03 DIAGNOSIS — I70201 Unspecified atherosclerosis of native arteries of extremities, right leg: Secondary | ICD-10-CM | POA: Diagnosis not present

## 2023-12-03 DIAGNOSIS — D3502 Benign neoplasm of left adrenal gland: Secondary | ICD-10-CM | POA: Diagnosis not present

## 2023-12-03 DIAGNOSIS — I10 Essential (primary) hypertension: Secondary | ICD-10-CM

## 2023-12-03 DIAGNOSIS — M48061 Spinal stenosis, lumbar region without neurogenic claudication: Secondary | ICD-10-CM | POA: Diagnosis not present

## 2023-12-03 DIAGNOSIS — M4312 Spondylolisthesis, cervical region: Secondary | ICD-10-CM | POA: Diagnosis not present

## 2023-12-03 DIAGNOSIS — K5900 Constipation, unspecified: Secondary | ICD-10-CM | POA: Diagnosis not present

## 2023-12-03 DIAGNOSIS — E785 Hyperlipidemia, unspecified: Secondary | ICD-10-CM | POA: Diagnosis not present

## 2023-12-03 DIAGNOSIS — Z79899 Other long term (current) drug therapy: Secondary | ICD-10-CM | POA: Diagnosis not present

## 2023-12-03 DIAGNOSIS — D649 Anemia, unspecified: Secondary | ICD-10-CM | POA: Diagnosis not present

## 2023-12-03 DIAGNOSIS — J432 Centrilobular emphysema: Secondary | ICD-10-CM | POA: Diagnosis not present

## 2023-12-03 DIAGNOSIS — I25118 Atherosclerotic heart disease of native coronary artery with other forms of angina pectoris: Secondary | ICD-10-CM | POA: Diagnosis not present

## 2023-12-03 DIAGNOSIS — Z0001 Encounter for general adult medical examination with abnormal findings: Secondary | ICD-10-CM | POA: Diagnosis not present

## 2023-12-03 DIAGNOSIS — J439 Emphysema, unspecified: Secondary | ICD-10-CM

## 2023-12-03 DIAGNOSIS — Z86718 Personal history of other venous thrombosis and embolism: Secondary | ICD-10-CM

## 2023-12-03 MED ORDER — DICYCLOMINE HCL 10 MG PO CAPS
10.0000 mg | ORAL_CAPSULE | Freq: Three times a day (TID) | ORAL | 3 refills | Status: AC | PRN
  Filled 2023-12-03 (×2): qty 90, 30d supply, fill #0

## 2023-12-03 MED ORDER — ROSUVASTATIN CALCIUM 40 MG PO TABS
40.0000 mg | ORAL_TABLET | Freq: Every day | ORAL | 3 refills | Status: AC
  Filled 2023-12-03: qty 100, 100d supply, fill #0

## 2023-12-03 MED ORDER — METHOCARBAMOL 500 MG PO TABS
500.0000 mg | ORAL_TABLET | Freq: Three times a day (TID) | ORAL | 1 refills | Status: AC | PRN
  Filled 2023-12-03: qty 90, 30d supply, fill #0

## 2023-12-04 NOTE — Progress Notes (Signed)
 This encounter was created in error - please disregard.

## 2023-12-06 ENCOUNTER — Other Ambulatory Visit: Payer: Self-pay

## 2023-12-17 ENCOUNTER — Other Ambulatory Visit: Payer: Self-pay

## 2023-12-29 ENCOUNTER — Other Ambulatory Visit: Payer: Self-pay

## 2024-01-19 ENCOUNTER — Other Ambulatory Visit: Payer: Self-pay

## 2024-01-25 ENCOUNTER — Other Ambulatory Visit: Payer: Self-pay

## 2024-01-27 ENCOUNTER — Other Ambulatory Visit: Payer: Self-pay

## 2024-02-09 ENCOUNTER — Other Ambulatory Visit: Payer: Self-pay

## 2024-02-14 ENCOUNTER — Other Ambulatory Visit: Payer: Self-pay

## 2024-02-14 MED ORDER — ALBUTEROL SULFATE HFA 108 (90 BASE) MCG/ACT IN AERS
2.0000 | INHALATION_SPRAY | RESPIRATORY_TRACT | 5 refills | Status: AC | PRN
  Filled 2024-02-14: qty 20.1, 51d supply, fill #0

## 2024-02-18 ENCOUNTER — Other Ambulatory Visit: Payer: Self-pay

## 2024-02-25 ENCOUNTER — Other Ambulatory Visit: Payer: Self-pay

## 2024-03-17 ENCOUNTER — Other Ambulatory Visit: Payer: Self-pay

## 2024-03-20 ENCOUNTER — Other Ambulatory Visit: Payer: Self-pay

## 2024-03-21 ENCOUNTER — Other Ambulatory Visit: Payer: Self-pay | Admitting: Adult Health

## 2024-03-21 ENCOUNTER — Other Ambulatory Visit: Payer: Self-pay

## 2024-03-21 DIAGNOSIS — I10 Essential (primary) hypertension: Secondary | ICD-10-CM

## 2024-03-21 MED ORDER — LOSARTAN POTASSIUM 25 MG PO TABS
25.0000 mg | ORAL_TABLET | Freq: Every day | ORAL | 5 refills | Status: AC
  Filled 2024-03-21: qty 30, 30d supply, fill #0

## 2024-03-23 ENCOUNTER — Other Ambulatory Visit: Payer: Self-pay

## 2024-04-12 ENCOUNTER — Other Ambulatory Visit: Payer: Self-pay | Admitting: Adult Health

## 2024-04-12 ENCOUNTER — Other Ambulatory Visit: Payer: Self-pay

## 2024-04-12 DIAGNOSIS — J439 Emphysema, unspecified: Secondary | ICD-10-CM

## 2024-04-12 MED ORDER — UMECLIDINIUM BROMIDE 62.5 MCG/ACT IN AEPB
1.0000 | INHALATION_SPRAY | Freq: Every day | RESPIRATORY_TRACT | 3 refills | Status: AC
  Filled 2024-04-12: qty 30, 30d supply, fill #0
